# Patient Record
Sex: Female | Born: 1945
Health system: Southern US, Community
[De-identification: ages and names within clinical notes are randomized; demographics above are authoritative.]

## PROBLEM LIST (undated history)

## (undated) DIAGNOSIS — F419 Anxiety disorder, unspecified: Secondary | ICD-10-CM

## (undated) DIAGNOSIS — K449 Diaphragmatic hernia without obstruction or gangrene: Secondary | ICD-10-CM

## (undated) DIAGNOSIS — B019 Varicella without complication: Secondary | ICD-10-CM

## (undated) DIAGNOSIS — E785 Hyperlipidemia, unspecified: Secondary | ICD-10-CM

## (undated) DIAGNOSIS — IMO0002 Reserved for concepts with insufficient information to code with codable children: Secondary | ICD-10-CM

## (undated) DIAGNOSIS — K7290 Hepatic failure, unspecified without coma: Secondary | ICD-10-CM

## (undated) DIAGNOSIS — N3281 Overactive bladder: Secondary | ICD-10-CM

## (undated) DIAGNOSIS — M1990 Unspecified osteoarthritis, unspecified site: Secondary | ICD-10-CM

## (undated) DIAGNOSIS — K851 Biliary acute pancreatitis without necrosis or infection: Secondary | ICD-10-CM

## (undated) DIAGNOSIS — I10 Essential (primary) hypertension: Secondary | ICD-10-CM

## (undated) DIAGNOSIS — Z8739 Personal history of other diseases of the musculoskeletal system and connective tissue: Secondary | ICD-10-CM

## (undated) DIAGNOSIS — F329 Major depressive disorder, single episode, unspecified: Secondary | ICD-10-CM

## (undated) DIAGNOSIS — K279 Peptic ulcer, site unspecified, unspecified as acute or chronic, without hemorrhage or perforation: Secondary | ICD-10-CM

## (undated) DIAGNOSIS — Z8719 Personal history of other diseases of the digestive system: Secondary | ICD-10-CM

## (undated) DIAGNOSIS — K219 Gastro-esophageal reflux disease without esophagitis: Secondary | ICD-10-CM

## (undated) DIAGNOSIS — F32A Depression, unspecified: Secondary | ICD-10-CM

## (undated) DIAGNOSIS — Z9889 Other specified postprocedural states: Secondary | ICD-10-CM

## (undated) DIAGNOSIS — J302 Other seasonal allergic rhinitis: Secondary | ICD-10-CM

## (undated) HISTORY — DX: Gastro-esophageal reflux disease without esophagitis: K21.9

## (undated) HISTORY — DX: Other seasonal allergic rhinitis: J30.2

## (undated) HISTORY — PX: BUNIONECTOMY: SHX129

## (undated) HISTORY — DX: Depression, unspecified: F32.A

## (undated) HISTORY — DX: Hyperlipidemia, unspecified: E78.5

## (undated) HISTORY — DX: Unspecified osteoarthritis, unspecified site: M19.90

## (undated) HISTORY — PX: TONSILLECTOMY AND ADENOIDECTOMY: SUR1326

## (undated) HISTORY — PX: NASAL/SINUS ENDOSCOPY: SHX288

## (undated) HISTORY — DX: Major depressive disorder, single episode, unspecified: F32.9

## (undated) HISTORY — DX: Other specified postprocedural states: Z98.890

## (undated) HISTORY — DX: Biliary acute pancreatitis without necrosis or infection: K85.10

## (undated) HISTORY — DX: Personal history of other diseases of the digestive system: Z87.19

## (undated) HISTORY — PX: SKIN CANCER EXCISION: SHX779

## (undated) HISTORY — DX: Anxiety disorder, unspecified: F41.9

## (undated) HISTORY — DX: Diaphragmatic hernia without obstruction or gangrene: K44.9

## (undated) HISTORY — DX: Overactive bladder: N32.81

## (undated) HISTORY — DX: Personal history of other diseases of the musculoskeletal system and connective tissue: Z87.39

## (undated) HISTORY — DX: Essential (primary) hypertension: I10

## (undated) HISTORY — DX: Hepatic failure, unspecified without coma: K72.90

## (undated) HISTORY — DX: Peptic ulcer, site unspecified, unspecified as acute or chronic, without hemorrhage or perforation: K27.9

## (undated) HISTORY — DX: Varicella without complication: B01.9

## (undated) HISTORY — DX: Reserved for concepts with insufficient information to code with codable children: IMO0002

---
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
## 2001-07-23 ENCOUNTER — Ambulatory Visit (HOSPITAL_COMMUNITY): Admission: RE | Admit: 2001-07-23 | Discharge: 2001-07-23 | Payer: Self-pay | Admitting: Gastroenterology

## 2001-07-23 ENCOUNTER — Encounter: Payer: Self-pay | Admitting: Gastroenterology

## 2001-07-26 ENCOUNTER — Ambulatory Visit (HOSPITAL_COMMUNITY): Admission: RE | Admit: 2001-07-26 | Discharge: 2001-07-26 | Payer: Self-pay | Admitting: Gastroenterology

## 2001-07-26 ENCOUNTER — Encounter: Payer: Self-pay | Admitting: Gastroenterology

## 2012-07-24 DIAGNOSIS — L851 Acquired keratosis [keratoderma] palmaris et plantaris: Secondary | ICD-10-CM | POA: Diagnosis not present

## 2012-07-24 DIAGNOSIS — L819 Disorder of pigmentation, unspecified: Secondary | ICD-10-CM | POA: Diagnosis not present

## 2012-07-24 DIAGNOSIS — L570 Actinic keratosis: Secondary | ICD-10-CM | POA: Diagnosis not present

## 2012-07-24 DIAGNOSIS — L719 Rosacea, unspecified: Secondary | ICD-10-CM | POA: Diagnosis not present

## 2012-08-01 DIAGNOSIS — F411 Generalized anxiety disorder: Secondary | ICD-10-CM | POA: Diagnosis not present

## 2012-08-01 DIAGNOSIS — E785 Hyperlipidemia, unspecified: Secondary | ICD-10-CM | POA: Diagnosis not present

## 2012-08-01 DIAGNOSIS — I10 Essential (primary) hypertension: Secondary | ICD-10-CM | POA: Diagnosis not present

## 2012-08-01 DIAGNOSIS — F329 Major depressive disorder, single episode, unspecified: Secondary | ICD-10-CM | POA: Diagnosis not present

## 2012-08-28 DIAGNOSIS — L820 Inflamed seborrheic keratosis: Secondary | ICD-10-CM | POA: Diagnosis not present

## 2012-08-28 DIAGNOSIS — L738 Other specified follicular disorders: Secondary | ICD-10-CM | POA: Diagnosis not present

## 2012-08-28 DIAGNOSIS — L570 Actinic keratosis: Secondary | ICD-10-CM | POA: Diagnosis not present

## 2012-08-30 DIAGNOSIS — E785 Hyperlipidemia, unspecified: Secondary | ICD-10-CM | POA: Diagnosis not present

## 2012-10-21 DIAGNOSIS — M545 Low back pain: Secondary | ICD-10-CM | POA: Diagnosis not present

## 2012-10-21 DIAGNOSIS — M47812 Spondylosis without myelopathy or radiculopathy, cervical region: Secondary | ICD-10-CM | POA: Diagnosis not present

## 2012-10-21 DIAGNOSIS — M4310 Spondylolisthesis, site unspecified: Secondary | ICD-10-CM | POA: Diagnosis not present

## 2012-10-21 DIAGNOSIS — M5030 Other cervical disc degeneration, unspecified cervical region: Secondary | ICD-10-CM | POA: Diagnosis not present

## 2012-10-21 DIAGNOSIS — F329 Major depressive disorder, single episode, unspecified: Secondary | ICD-10-CM | POA: Diagnosis not present

## 2012-10-21 DIAGNOSIS — M542 Cervicalgia: Secondary | ICD-10-CM | POA: Diagnosis not present

## 2012-10-21 DIAGNOSIS — IMO0002 Reserved for concepts with insufficient information to code with codable children: Secondary | ICD-10-CM | POA: Diagnosis not present

## 2012-10-21 DIAGNOSIS — F411 Generalized anxiety disorder: Secondary | ICD-10-CM | POA: Diagnosis not present

## 2012-10-21 DIAGNOSIS — E785 Hyperlipidemia, unspecified: Secondary | ICD-10-CM | POA: Diagnosis not present

## 2012-11-05 DIAGNOSIS — M5020 Other cervical disc displacement, unspecified cervical region: Secondary | ICD-10-CM | POA: Diagnosis not present

## 2012-11-05 DIAGNOSIS — M47812 Spondylosis without myelopathy or radiculopathy, cervical region: Secondary | ICD-10-CM | POA: Diagnosis not present

## 2012-11-14 DIAGNOSIS — M25519 Pain in unspecified shoulder: Secondary | ICD-10-CM | POA: Diagnosis not present

## 2012-11-14 DIAGNOSIS — M6281 Muscle weakness (generalized): Secondary | ICD-10-CM | POA: Diagnosis not present

## 2012-11-14 DIAGNOSIS — M542 Cervicalgia: Secondary | ICD-10-CM | POA: Diagnosis not present

## 2012-11-14 DIAGNOSIS — M25619 Stiffness of unspecified shoulder, not elsewhere classified: Secondary | ICD-10-CM | POA: Diagnosis not present

## 2012-11-18 DIAGNOSIS — M542 Cervicalgia: Secondary | ICD-10-CM | POA: Diagnosis not present

## 2012-11-18 DIAGNOSIS — M5020 Other cervical disc displacement, unspecified cervical region: Secondary | ICD-10-CM | POA: Diagnosis not present

## 2012-11-19 DIAGNOSIS — M25619 Stiffness of unspecified shoulder, not elsewhere classified: Secondary | ICD-10-CM | POA: Diagnosis not present

## 2012-11-19 DIAGNOSIS — M25519 Pain in unspecified shoulder: Secondary | ICD-10-CM | POA: Diagnosis not present

## 2012-11-19 DIAGNOSIS — M542 Cervicalgia: Secondary | ICD-10-CM | POA: Diagnosis not present

## 2012-11-19 DIAGNOSIS — M6281 Muscle weakness (generalized): Secondary | ICD-10-CM | POA: Diagnosis not present

## 2012-11-21 DIAGNOSIS — M25519 Pain in unspecified shoulder: Secondary | ICD-10-CM | POA: Diagnosis not present

## 2012-11-21 DIAGNOSIS — M542 Cervicalgia: Secondary | ICD-10-CM | POA: Diagnosis not present

## 2012-11-21 DIAGNOSIS — M25619 Stiffness of unspecified shoulder, not elsewhere classified: Secondary | ICD-10-CM | POA: Diagnosis not present

## 2012-11-21 DIAGNOSIS — M6281 Muscle weakness (generalized): Secondary | ICD-10-CM | POA: Diagnosis not present

## 2012-11-26 DIAGNOSIS — M25619 Stiffness of unspecified shoulder, not elsewhere classified: Secondary | ICD-10-CM | POA: Diagnosis not present

## 2012-11-26 DIAGNOSIS — M6281 Muscle weakness (generalized): Secondary | ICD-10-CM | POA: Diagnosis not present

## 2012-11-26 DIAGNOSIS — M25519 Pain in unspecified shoulder: Secondary | ICD-10-CM | POA: Diagnosis not present

## 2012-11-26 DIAGNOSIS — M542 Cervicalgia: Secondary | ICD-10-CM | POA: Diagnosis not present

## 2012-11-28 DIAGNOSIS — M542 Cervicalgia: Secondary | ICD-10-CM | POA: Diagnosis not present

## 2012-11-28 DIAGNOSIS — M25619 Stiffness of unspecified shoulder, not elsewhere classified: Secondary | ICD-10-CM | POA: Diagnosis not present

## 2012-11-28 DIAGNOSIS — M25519 Pain in unspecified shoulder: Secondary | ICD-10-CM | POA: Diagnosis not present

## 2012-11-28 DIAGNOSIS — M6281 Muscle weakness (generalized): Secondary | ICD-10-CM | POA: Diagnosis not present

## 2012-12-03 DIAGNOSIS — M542 Cervicalgia: Secondary | ICD-10-CM | POA: Diagnosis not present

## 2012-12-03 DIAGNOSIS — M25519 Pain in unspecified shoulder: Secondary | ICD-10-CM | POA: Diagnosis not present

## 2012-12-03 DIAGNOSIS — M25619 Stiffness of unspecified shoulder, not elsewhere classified: Secondary | ICD-10-CM | POA: Diagnosis not present

## 2012-12-03 DIAGNOSIS — M6281 Muscle weakness (generalized): Secondary | ICD-10-CM | POA: Diagnosis not present

## 2012-12-06 DIAGNOSIS — M25519 Pain in unspecified shoulder: Secondary | ICD-10-CM | POA: Diagnosis not present

## 2012-12-06 DIAGNOSIS — M6281 Muscle weakness (generalized): Secondary | ICD-10-CM | POA: Diagnosis not present

## 2012-12-06 DIAGNOSIS — M542 Cervicalgia: Secondary | ICD-10-CM | POA: Diagnosis not present

## 2012-12-06 DIAGNOSIS — M25619 Stiffness of unspecified shoulder, not elsewhere classified: Secondary | ICD-10-CM | POA: Diagnosis not present

## 2012-12-10 DIAGNOSIS — M542 Cervicalgia: Secondary | ICD-10-CM | POA: Diagnosis not present

## 2012-12-10 DIAGNOSIS — M25519 Pain in unspecified shoulder: Secondary | ICD-10-CM | POA: Diagnosis not present

## 2012-12-10 DIAGNOSIS — M25619 Stiffness of unspecified shoulder, not elsewhere classified: Secondary | ICD-10-CM | POA: Diagnosis not present

## 2012-12-10 DIAGNOSIS — M6281 Muscle weakness (generalized): Secondary | ICD-10-CM | POA: Diagnosis not present

## 2012-12-12 DIAGNOSIS — M25619 Stiffness of unspecified shoulder, not elsewhere classified: Secondary | ICD-10-CM | POA: Diagnosis not present

## 2012-12-12 DIAGNOSIS — M25519 Pain in unspecified shoulder: Secondary | ICD-10-CM | POA: Diagnosis not present

## 2012-12-12 DIAGNOSIS — M6281 Muscle weakness (generalized): Secondary | ICD-10-CM | POA: Diagnosis not present

## 2012-12-12 DIAGNOSIS — M542 Cervicalgia: Secondary | ICD-10-CM | POA: Diagnosis not present

## 2013-04-21 DIAGNOSIS — I10 Essential (primary) hypertension: Secondary | ICD-10-CM | POA: Diagnosis not present

## 2013-04-21 DIAGNOSIS — E7800 Pure hypercholesterolemia, unspecified: Secondary | ICD-10-CM | POA: Diagnosis not present

## 2013-06-02 ENCOUNTER — Encounter: Payer: Self-pay | Admitting: Family Medicine

## 2013-06-02 ENCOUNTER — Ambulatory Visit (INDEPENDENT_AMBULATORY_CARE_PROVIDER_SITE_OTHER): Payer: Medicare Other | Admitting: Family Medicine

## 2013-06-02 VITALS — BP 130/86 | HR 88 | Temp 98.2°F | Resp 16 | Wt 158.0 lb

## 2013-06-02 DIAGNOSIS — K219 Gastro-esophageal reflux disease without esophagitis: Secondary | ICD-10-CM | POA: Insufficient documentation

## 2013-06-02 DIAGNOSIS — F419 Anxiety disorder, unspecified: Secondary | ICD-10-CM

## 2013-06-02 DIAGNOSIS — N318 Other neuromuscular dysfunction of bladder: Secondary | ICD-10-CM

## 2013-06-02 DIAGNOSIS — R29818 Other symptoms and signs involving the nervous system: Secondary | ICD-10-CM

## 2013-06-02 DIAGNOSIS — E785 Hyperlipidemia, unspecified: Secondary | ICD-10-CM

## 2013-06-02 DIAGNOSIS — F09 Unspecified mental disorder due to known physiological condition: Secondary | ICD-10-CM

## 2013-06-02 DIAGNOSIS — I10 Essential (primary) hypertension: Secondary | ICD-10-CM

## 2013-06-02 DIAGNOSIS — F32A Depression, unspecified: Secondary | ICD-10-CM

## 2013-06-02 DIAGNOSIS — F418 Other specified anxiety disorders: Secondary | ICD-10-CM

## 2013-06-02 DIAGNOSIS — R4189 Other symptoms and signs involving cognitive functions and awareness: Secondary | ICD-10-CM

## 2013-06-02 DIAGNOSIS — Z85828 Personal history of other malignant neoplasm of skin: Secondary | ICD-10-CM

## 2013-06-02 DIAGNOSIS — R2689 Other abnormalities of gait and mobility: Secondary | ICD-10-CM

## 2013-06-02 DIAGNOSIS — F329 Major depressive disorder, single episode, unspecified: Secondary | ICD-10-CM

## 2013-06-02 DIAGNOSIS — F341 Dysthymic disorder: Secondary | ICD-10-CM

## 2013-06-02 DIAGNOSIS — R413 Other amnesia: Secondary | ICD-10-CM | POA: Diagnosis not present

## 2013-06-02 DIAGNOSIS — N3281 Overactive bladder: Secondary | ICD-10-CM

## 2013-06-02 DIAGNOSIS — L570 Actinic keratosis: Secondary | ICD-10-CM

## 2013-06-02 HISTORY — DX: Overactive bladder: N32.81

## 2013-06-02 LAB — LIPID PANEL
CHOL/HDL RATIO: 5
Cholesterol: 235 mg/dL — ABNORMAL HIGH (ref 0–200)
HDL: 49.1 mg/dL (ref >39.00)
Triglycerides: 91 mg/dL (ref 0.0–149.0)
VLDL: 18.2 mg/dL (ref 0.0–40.0)

## 2013-06-02 LAB — BASIC METABOLIC PANEL
BUN: 19 mg/dL (ref 6–23)
CO2: 25 mEq/L (ref 19–32)
Calcium: 9.4 mg/dL (ref 8.4–10.5)
Chloride: 104 mEq/L (ref 96–112)
Creatinine, Ser: 0.8 mg/dL (ref 0.4–1.2)
GFR: 73.14 mL/min (ref >60.00)
GLUCOSE: 90 mg/dL (ref 70–99)
Potassium: 3.8 mEq/L (ref 3.5–5.1)
Sodium: 138 mEq/L (ref 135–145)

## 2013-06-02 LAB — LDL CHOLESTEROL, DIRECT: Direct LDL: 168.9 mg/dL

## 2013-06-02 LAB — MAGNESIUM: MAGNESIUM: 2 mg/dL (ref 1.5–2.5)

## 2013-06-02 NOTE — Patient Instructions (Addendum)
-  please call to set up mammogram  -please call to schedule appointment with psychiatrist - will let psychiatrist prescribe your ambien and psychiatric medications  -We placed a referral for you as discussed to the neurologist for you memory and balance and to the dermatologist. It usually takes about 1-2 weeks to process and schedule this referral. If you have not heard from [place] regarding this appointment in 2 weeks please contact our office.  -We have ordered labs or studies at this visit. It can take up to 1-2 weeks for results and processing. We will contact you with instructions IF your results are abnormal. Normal results will be released to your [facility]. If you have not heard from [place] or can not find your results in [facility] in 2 weeks please contact our office.  -PLEASE SIGN UP FOR MYCHART TODAY   We recommend the following healthy lifestyle measures: - eat a healthy diet consisting of lots of vegetables, fruits, beans, nuts, seeds, healthy meats such as white chicken and fish and whole grains.  - avoid fried foods, fast food, processed foods, sodas, red meet and other fattening foods.  - get a least 150 minutes of aerobic exercise per week.   Call pharmacy to transfer medications, please have your psychiatrist prescribe the ambien, buspar and prozac  Follow up in: 3-4 months

## 2013-06-02 NOTE — Progress Notes (Signed)
Chief Complaint  Patient presents with  . establish care  . dermatology referral    spot on hand, reoccuring    HPI:  ANTRICE PAL is here to establish care.  Last PCP and physical: reports has had routine gyn physicals in the past and all normal and she no longer gets these, mammogram in June 2013  Has the following chronic problems and concerns today:   AKs on hand: -hs [facility], want referral to derm  Anxiety and Depression: -on Buspar and Prozac, ambien 1-2 times per week -stable, but used to see psych and will establish care -husband is alcoholic  GERD: -stable on nexium -if tries to stop has symptoms  HLD: -on fenofibrate  Memory and balance issues: -not sure why taking on potassium -has trouble with multitasking and names, seems to be progressing -lately worsening and some balance issues  Hyperactive bladder: -stable on detrol  HTN:  -valsartan-hctz -stable    Patient Active Problem List   Diagnosis Date Noted  . Depression with anxiety 06/02/2013  . GERD (gastroesophageal reflux disease) 06/02/2013  . Hyperlipidemia 06/02/2013  . Overactive bladder 06/02/2013    Health Maintenance:  ROS: See pertinent positives and negatives per HPI.  Past Medical History  Diagnosis Date  . Anxiety   . Depression   . GERD (gastroesophageal reflux disease)   . Hyperlipidemia   . Overactive bladder 06/02/2013    Family History  Problem Relation Age of Onset  . Mental illness Mother   . Cancer Father     throat cancer  . Heart disease Father   . Mental illness Father   . Cancer Sister     thyroid    History   Social History  . Marital Status: Married    Spouse Name: N/A    Number of Children: N/A  . Years of Education: N/A   Social History Main Topics  . Smoking status: Never Smoker   . Smokeless tobacco: None  . Alcohol Use: Yes     Comment:  drink every 3-4 days  . Drug Use: None  . Sexual Activity: None   Other Topics Concern  . None    Social History Narrative   Work or School: retired Web designer Situation: lives with husband, daughter and 2 children      Spiritual Beliefs: Christian, Catholic      Lifestyle: no regular exercise; diet is good              Current outpatient prescriptions:busPIRone (BUSPAR) 10 MG tablet, Take 2 tablets by mouth twice daily, Disp: , Rfl: ;  Cholecalciferol (VITAMIN D3) 2000 UNITS TABS, Take 2,000 Units by mouth daily., Disp: , Rfl: ;  diphenhydramine-acetaminophen (TYLENOL PM EXTRA STRENGTH) 25-500 MG TABS, Take 1 tablet by mouth at bedtime as needed., Disp: , Rfl: ;  esomeprazole (NEXIUM) 20 MG capsule, Take 20 mg by mouth daily at 12 noon., Disp: , Rfl:  fenofibrate 160 MG tablet, Take 160 mg by mouth daily., Disp: , Rfl: ;  FLUoxetine (PROZAC) 40 MG capsule, Take 2 tablets by mouth once daily, Disp: , Rfl: ;  magnesium gluconate (MAGONATE) 500 MG tablet, Take 500 mg by mouth daily., Disp: , Rfl: ;  Potassium Gluconate 595 MG CAPS, Take 595 mg by mouth daily., Disp: , Rfl: ;  tolterodine (DETROL LA) 4 MG 24 hr capsule, Take 4 mg by mouth daily., Disp: , Rfl:  valsartan-hydrochlorothiazide (DIOVAN-HCT) 80-12.5 MG per tablet, Take 1 tablet by  mouth daily., Disp: , Rfl: ;  zolpidem (AMBIEN) 10 MG tablet, Take 10 mg by mouth at bedtime as needed for sleep., Disp: , Rfl:   EXAM:  Filed Vitals:   06/02/13 1053  BP: 130/86  Pulse: 88  Temp: 98.2 F (36.8 C)  Resp: 16    There is no height on file to calculate BMI.  GENERAL: vitals reviewed and listed above, alert, oriented, appears well hydrated and in no acute distress  HEENT: atraumatic, conjunttiva clear, no obvious abnormalities on inspection of external nose and ears  NECK: no obvious masses on inspection  LUNGS: clear to auscultation bilaterally, no wheezes, rales or rhonchi, good air movement  CV: HRRR, no peripheral edema  MS: moves all extremities without noticeable abnormality  PSYCH: pleasant and  cooperative, no obvious depression or anxiety  ASSESSMENT AND PLAN:  Discussed the following assessment and plan:  Cognitive decline - Plan: Ambulatory referral to Neurology  Poor balance - Plan: Ambulatory referral to Neurology  Anxiety and depression  Poor memory - Plan: Ambulatory referral to Neurology  Depression with anxiety  GERD (gastroesophageal reflux disease)  Hyperlipidemia - Plan: Basic metabolic panel, Lipid Panel, Magnesium  Overactive bladder  Essential hypertension, benign  Actinic keratosis - Plan: Ambulatory referral to Dermatology  Hx of nonmelanoma skin cancer - Plan: Ambulatory referral to Dermatology  -We reviewed the PMH, PSH, FH, SH, Meds and Allergies. -We provided refills for any medications we will prescribe as needed. -We addressed current concerns per orders and patient instructions. -We have asked for records for pertinent exams, studies, vaccines and notes from previous providers. -We have advised patient to follow up per instructions below. -advised she see psych for her psychiatirc medications and discussed dangers/adverse effects with ambien and advise to stop or decrease if her psychiatrist is ok with this -referrals placed per her request for dermatologist and neurologist -FASTING LABS -follow up in 3-4 months   -Patient advised to return or notify a doctor immediately if symptoms worsen or persist or new concerns arise.  Patient Instructions  -please call to set up mammogram  -please call to schedule appointment with psychiatrist - will let psychiatrist prescribe your ambien and psychiatric medications  -We placed a referral for you as discussed to the neurologist for you memory and balance and to the dermatologist. It usually takes about 1-2 weeks to process and schedule this referral. If you have not heard from [place] regarding this appointment in 2 weeks please contact our office.  -We have ordered labs or studies at this visit. It  can take up to 1-2 weeks for results and processing. We will contact you with instructions IF your results are abnormal. Normal results will be released to your [facility] If you have not heard from [place] or can not find your results in [facility] in 2 weeks please contact our office.  -PLEASE SIGN UP FOR MYCHART TODAY   We recommend the following healthy lifestyle measures: - eat a healthy diet consisting of lots of vegetables, fruits, beans, nuts, seeds, healthy meats such as white chicken and fish and whole grains.  - avoid fried foods, fast food, processed foods, sodas, red meet and other fattening foods.  - get a least 150 minutes of aerobic exercise per week.   Follow up in: 3-4 months       KIM, HANNAH R.

## 2013-06-04 ENCOUNTER — Ambulatory Visit (INDEPENDENT_AMBULATORY_CARE_PROVIDER_SITE_OTHER): Payer: Medicare Other | Admitting: Neurology

## 2013-06-04 ENCOUNTER — Encounter: Payer: Self-pay | Admitting: Neurology

## 2013-06-04 VITALS — BP 144/84 | HR 80 | Temp 98.6°F | Resp 14 | Ht 62.0 in | Wt 159.6 lb

## 2013-06-04 DIAGNOSIS — R413 Other amnesia: Secondary | ICD-10-CM

## 2013-06-04 DIAGNOSIS — F411 Generalized anxiety disorder: Secondary | ICD-10-CM | POA: Diagnosis not present

## 2013-06-04 DIAGNOSIS — R4189 Other symptoms and signs involving cognitive functions and awareness: Secondary | ICD-10-CM

## 2013-06-04 DIAGNOSIS — R29818 Other symptoms and signs involving the nervous system: Secondary | ICD-10-CM | POA: Diagnosis not present

## 2013-06-04 DIAGNOSIS — F419 Anxiety disorder, unspecified: Secondary | ICD-10-CM

## 2013-06-04 DIAGNOSIS — R2689 Other abnormalities of gait and mobility: Secondary | ICD-10-CM

## 2013-06-04 LAB — VITAMIN B12: VITAMIN B 12: 316 pg/mL (ref 211–911)

## 2013-06-04 LAB — TSH: TSH: 0.53 u[IU]/mL (ref 0.35–5.50)

## 2013-06-04 NOTE — Patient Instructions (Signed)
On my testing, I don't appreciate any mild cognitive impairment (evidence of early dementia).  I think the memory problems are likely related to stress.  Also, I don't appreciate any significant or noticeable gait or balance problems.  Again, I think that the stress you are under is contributing to preoccupation and difficulty with train of thought, as well as hypervigilance of symptoms.  I don't think we need to check imaging of the brain but we will check a B12 and thyroid level.  You may follow up in one year for re-evaluation if needed.  Call with questions or concerns.

## 2013-06-04 NOTE — Progress Notes (Signed)
NEUROLOGY CONSULTATION NOTE  Crystal Patrick MRN: [id number] DOB: 01-Oct-1945  Referring provider: Dr. Selena Batten Primary care provider: Dr. Selena Batten  Reason for consult:  Memory problems and balance.  HISTORY OF PRESENT ILLNESS: Crystal Patrick is a 68 year old right-handed woman with history of depression, anxiety, GERD, hyperlipidemia and overactive bladder who presents for cognitive decline and balance problems.  Records and images were personally reviewed where available.    She feels she has noted symptoms for some time, but has significantly progressed since this past summer.  She is under a great deal of stress and anxiety due to financial set backs.  She and her husband moved to [place] from [place] because they had to sell their house, which may be foreclosed.  Also, her husband is an alcoholic as well.  They moved in late-August and live with their daughter and her two twin girls.  Regarding memory, she notes word-finding difficulties.  She also has difficulty recalling names of acquaintances but not close friends or family.  She sometimes gets the names of her granddaughters mixed up.  She will misplace items.  She often has to reread book chapters because she will quickly forget.  She is able to follow a movie.  She finds it much more difficult to multi-task and if she is using the stove, she needs to use a timer so she does not forget to tend to whatever she is cooking.  She also finds it more difficult to perform simple tasks, such as setting the digital alarm clock.  She also had trouble using the candle lighter.  She does not get disoriented when she drives.  She is able to pay the pills without difficulty.  Family members or friends have not noticed any significant problems.  She denies hallucinations or delusions. She is also now much more irritable.  She has some difficulty tolerating her granddaughters.  Reports that her father had memory problems and her mother has dementia.  She  also notes difficulty with balance.  Sometimes she will stumble while walking.  She has never fell while walking.  She denies vision loss, dizziness, lightheadedness, leg weakness or numbness.  She denies neck and back pain.  In September, she fell backwards off a ladder and hit the back of her head.  She did not lose conscousness.  She did not have a headache.  Since she felt okay, she did not seek medical attention.  Several weeks later, she was on the ladder in the attic and almost felt herself fall again, but was able to catch herself.  06/02/13 LABS:  Na 138, K 3.8, Cl 104, glucose 90, BUN 19, Cr 0.8, Ca 9.4, Mg 2.0.  PAST MEDICAL HISTORY: Past Medical History  Diagnosis Date  . Anxiety   . Depression   . GERD (gastroesophageal reflux disease)   . Hyperlipidemia   . Overactive bladder 06/02/2013  . Arthritis   . Cancer     squamous  . Chicken pox   . Seasonal allergies   . Hypertension     high blood pressure    PAST SURGICAL HISTORY: Past Surgical History  Procedure Laterality Date  . Tonsillectomy and adenoidectomy    . Bunionectomy Bilateral   . Nasal/sinus endoscopy      MEDICATIONS: Current Outpatient Prescriptions on File Prior to Visit  Medication Sig Dispense Refill  . busPIRone (BUSPAR) 10 MG tablet Take 2 tablets by mouth twice daily      . Cholecalciferol (VITAMIN D3)  2000 UNITS TABS Take 2,000 Units by mouth daily.      . diphenhydramine-acetaminophen (TYLENOL PM EXTRA STRENGTH) 25-500 MG TABS Take 1 tablet by mouth at bedtime as needed.      Marland Kitchen esomeprazole (NEXIUM) 20 MG capsule Take 20 mg by mouth daily at 12 noon.      . fenofibrate 160 MG tablet Take 160 mg by mouth daily.      Marland Kitchen FLUoxetine (PROZAC) 40 MG capsule Take 2 tablets by mouth once daily      . magnesium gluconate (MAGONATE) 500 MG tablet Take 500 mg by mouth daily.      . Potassium Gluconate 595 MG CAPS Take 595 mg by mouth daily.      Marland Kitchen tolterodine (DETROL LA) 4 MG 24 hr capsule Take 4 mg by  mouth daily.      . valsartan-hydrochlorothiazide (DIOVAN-HCT) 80-12.5 MG per tablet Take 1 tablet by mouth daily.      Marland Kitchen zolpidem (AMBIEN) 10 MG tablet Take 10 mg by mouth at bedtime as needed for sleep.       No current facility-administered medications on file prior to visit.    ALLERGIES: Allergies  Allergen Reactions  . Statins     Liver failue    FAMILY HISTORY: Family History  Problem Relation Age of Onset  . Mental illness Mother   . Cancer Father     throat cancer  . Heart disease Father   . Mental illness Father   . Cancer Sister     thyroid    SOCIAL HISTORY: History   Social History  . Marital Status: Married    Spouse Name: N/A    Number of Children: N/A  . Years of Education: N/A   Occupational History  . Not on file.   Social History Main Topics  . Smoking status: Never Smoker   . Smokeless tobacco: Not on file  . Alcohol Use: Yes     Comment: one drink a day  . Drug Use: Not on file  . Sexual Activity: Not on file   Other Topics Concern  . 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, daughter and 2 children      Spiritual Beliefs: Christian, Catholic      Lifestyle: no regular exercise; diet is good              REVIEW OF SYSTEMS: Constitutional: No fevers, chills, or sweats, no generalized fatigue, change in appetite Eyes: No visual changes, double vision, eye pain Ear, nose and throat: No hearing loss, ear pain, nasal congestion, sore throat Cardiovascular: No chest pain, palpitations Respiratory:  No shortness of breath at rest or with exertion, wheezes GastrointestinaI: No nausea, vomiting, diarrhea, abdominal pain, fecal incontinence Genitourinary:  No dysuria, urinary retention or frequency Musculoskeletal:  No neck pain, back pain Integumentary: No rash, pruritus, skin lesions Neurological: as above Psychiatric: Depression, anxiety Endocrine: No  palpitations, fatigue, diaphoresis, mood swings, change in appetite, change in weight, increased thirst Hematologic/Lymphatic:  No anemia, purpura, petechiae. Allergic/Immunologic: no itchy/runny eyes, nasal congestion, recent allergic reactions, rashes  PHYSICAL EXAM: Filed Vitals:   06/04/13 1216  BP: 144/84  Pulse: 80  Temp: 98.6 F (37 C)  Resp: 14   General: No acute distress Head:  Normocephalic/atraumatic Neck: supple, no paraspinal tenderness, full range of motion Back: No paraspinal tenderness Heart: regular rate and rhythm Lungs: Clear to  auscultation bilaterally. Vascular: No carotid bruits. Neurological Exam: Mental status: alert and oriented to person, place, and time, speech fluent and not dysarthric, able to name, repeat, write, read and follow 3 step commands across midline.  Able to recall 3 out of 3 words after a couple of minutes (4 out of 5 words after 5 minutes).  Able to correctly complete Trail Making Test and draw a clock.  Had difficulty copying a cube but was eventually able to correctly perform this without help.  Did incorrectly copy intersecting pentagons slightly.  Attention was intact.  Naming fluency intact.  Abstraction intact.  MMSE 29/30.  MOCA 29/30 Cranial nerves: CN I: not tested CN II: pupils equal, round and reactive to light, visual fields intact, fundi unremarkable. CN III, IV, VI:  full range of motion, no nystagmus, no ptosis CN V: facial sensation intact CN VII: upper and lower face symmetric CN VIII: hearing intact CN IX, X: gag intact, uvula midline CN XI: sternocleidomastoid and trapezius muscles intact CN XII: tongue midline Bulk & Tone: normal, no fasciculations. Motor: 5/5 throughout Sensation: temperature and vibration intact. Deep Tendon Reflexes: 2+ throughout, toes down Finger to nose testing: no dysmetria Heel to shin: no dysmetria Gait: normal stance and stride.  Able to turn well, walk on heels, toes and in tandem.  Romberg negative with only brief mild sway.  IMPRESSION: 1.  Memory problems.  No discernible cognitive impairment found on my exam.  Suspect related to preoccupation due to stress and anxiety from outside factors. 2.  Balance problems.  No abnormalities found on exam.   PLAN: 1.  We will check B12 and TSH. 2.  No indication for need of brain imaging. 3.  Reassurance. 4.  May follow up for re-evaluation in one year if needed.  45 minutes spent with patient, over 50% spent counseling and coordinating care.  Thank you for allowing me to take part in the care of this patient.  Metta Clines, DO  CC:  Colin Benton, DO

## 2013-06-06 DIAGNOSIS — F331 Major depressive disorder, recurrent, moderate: Secondary | ICD-10-CM | POA: Diagnosis not present

## 2013-06-06 LAB — METHYLMALONIC ACID, SERUM: Methylmalonic Acid, Quant: 0.15 umol/L (ref <0.40)

## 2013-06-09 ENCOUNTER — Telehealth: Payer: Self-pay | Admitting: *Deleted

## 2013-06-09 NOTE — Telephone Encounter (Signed)
Message copied by Claudie Revering on Mon Jun 09, 2013  4:22 PM ------      Message from: JAFFE, ADAM R      Created: Mon Jun 09, 2013  8:32 AM       Please let Ms. Jablon know that her blood work looks okay.      ARJ      ----- Message -----         From: [facility]         Sent: 06/04/2013   4:42 PM           To: Dudley Major, DO                   ------

## 2013-06-12 ENCOUNTER — Encounter: Payer: Self-pay | Admitting: Family Medicine

## 2013-06-12 NOTE — Progress Notes (Signed)
Received office notes from [place]; Dr. [name], MD

## 2013-06-18 DIAGNOSIS — F331 Major depressive disorder, recurrent, moderate: Secondary | ICD-10-CM | POA: Diagnosis not present

## 2013-06-24 ENCOUNTER — Telehealth: Payer: Self-pay | Admitting: Family Medicine

## 2013-06-24 NOTE — Telephone Encounter (Signed)
Relevant patient education mailed to patient.  

## 2013-07-02 DIAGNOSIS — F331 Major depressive disorder, recurrent, moderate: Secondary | ICD-10-CM | POA: Diagnosis not present

## 2013-07-07 DIAGNOSIS — L570 Actinic keratosis: Secondary | ICD-10-CM | POA: Diagnosis not present

## 2013-07-07 DIAGNOSIS — D235 Other benign neoplasm of skin of trunk: Secondary | ICD-10-CM | POA: Diagnosis not present

## 2013-07-07 DIAGNOSIS — D485 Neoplasm of uncertain behavior of skin: Secondary | ICD-10-CM | POA: Diagnosis not present

## 2013-07-11 NOTE — Progress Notes (Signed)
Received office notes from [facility] on 2.16.15 for destruction of lesions with cryosurgery.  These are common, treat since irriated, can get more with time, and wound care.  Return in 1 month if symptoms worsen or persist and follow up yearly.  Sent to scan.

## 2013-07-16 DIAGNOSIS — F331 Major depressive disorder, recurrent, moderate: Secondary | ICD-10-CM | POA: Diagnosis not present

## 2013-07-29 DIAGNOSIS — F331 Major depressive disorder, recurrent, moderate: Secondary | ICD-10-CM | POA: Diagnosis not present

## 2013-08-12 DIAGNOSIS — F331 Major depressive disorder, recurrent, moderate: Secondary | ICD-10-CM | POA: Diagnosis not present

## 2013-08-26 DIAGNOSIS — F331 Major depressive disorder, recurrent, moderate: Secondary | ICD-10-CM | POA: Diagnosis not present

## 2013-09-09 DIAGNOSIS — F331 Major depressive disorder, recurrent, moderate: Secondary | ICD-10-CM | POA: Diagnosis not present

## 2013-09-15 ENCOUNTER — Encounter: Payer: Self-pay | Admitting: Family Medicine

## 2013-09-15 ENCOUNTER — Ambulatory Visit (INDEPENDENT_AMBULATORY_CARE_PROVIDER_SITE_OTHER): Payer: Medicare Other | Admitting: Family Medicine

## 2013-09-15 VITALS — BP 132/88 | HR 86 | Temp 98.9°F | Ht 62.0 in | Wt 157.8 lb

## 2013-09-15 DIAGNOSIS — F341 Dysthymic disorder: Secondary | ICD-10-CM | POA: Diagnosis not present

## 2013-09-15 DIAGNOSIS — I10 Essential (primary) hypertension: Secondary | ICD-10-CM

## 2013-09-15 DIAGNOSIS — N318 Other neuromuscular dysfunction of bladder: Secondary | ICD-10-CM | POA: Diagnosis not present

## 2013-09-15 DIAGNOSIS — E785 Hyperlipidemia, unspecified: Secondary | ICD-10-CM | POA: Diagnosis not present

## 2013-09-15 DIAGNOSIS — K219 Gastro-esophageal reflux disease without esophagitis: Secondary | ICD-10-CM

## 2013-09-15 DIAGNOSIS — N3281 Overactive bladder: Secondary | ICD-10-CM

## 2013-09-15 DIAGNOSIS — F418 Other specified anxiety disorders: Secondary | ICD-10-CM

## 2013-09-15 MED ORDER — TOLTERODINE TARTRATE ER 4 MG PO CP24: 4.0000 mg | ORAL_CAPSULE | Freq: Every day | ORAL | Status: DC

## 2013-09-15 MED ORDER — FENOFIBRATE 160 MG PO TABS: 160.0000 mg | ORAL_TABLET | Freq: Every day | ORAL | Status: DC

## 2013-09-15 MED ORDER — VALSARTAN-HYDROCHLOROTHIAZIDE 80-12.5 MG PO TABS: 1.0000 | ORAL_TABLET | Freq: Every day | ORAL | Status: DC

## 2013-09-15 NOTE — Progress Notes (Signed)
No chief complaint on file.   HPI:  Follow up:  Depression/Co impariment: -per review neuro notes, cog issues likely related to depression -psych adjusting medications and decreasing prozac -reports things are going well despite situation not great still at home  GERD: -stable  HLD: -pn fenofibrate, reports can not take statins - systemic reaction and severe liver reaction -diet has been poor now  -reports is going to start walking and watching diet -does not want to check now - prefers to work on diet and exercise and check at physical  Hyperactive bladder -stable  ROS: See pertinent positives and negatives per HPI.  Past Medical History  Diagnosis Date  . Anxiety   . Depression   . GERD (gastroesophageal reflux disease)   . Hyperlipidemia   . Overactive bladder 06/02/2013  . Arthritis   . Cancer     squamous  . Chicken pox   . Seasonal allergies   . Hypertension     high blood pressure    Past Surgical History  Procedure Laterality Date  . Tonsillectomy and adenoidectomy    . Bunionectomy Bilateral   . Nasal/sinus endoscopy      Family History  Problem Relation Age of Onset  . Mental illness Mother   . Cancer Father     throat cancer  . Heart disease Father   . Mental illness Father   . Cancer Sister     thyroid    History   Social History  . Marital Status: Married    Spouse Name: N/A    Number of Children: N/A  . Years of Education: N/A   Social History Main Topics  . Smoking status: Never Smoker   . Smokeless tobacco: None  . Alcohol Use: Yes     Comment: one drink a day  . Drug Use: None  . Sexual Activity: None   Other Topics Concern  . 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      Spiritual Beliefs: Christian, Catholic      Lifestyle: no regular exercise; diet is good              Current outpatient prescriptions:busPIRone  (BUSPAR) 10 MG tablet, Take 2 tablets by mouth twice daily, Disp: , Rfl: ;  Cholecalciferol (VITAMIN D3) 2000 UNITS TABS, Take 2,000 Units by mouth daily., Disp: , Rfl: ;  diphenhydramine-acetaminophen (TYLENOL PM EXTRA STRENGTH) 25-500 MG TABS, Take 1 tablet by mouth at bedtime as needed., Disp: , Rfl: ;  esomeprazole (NEXIUM) 20 MG capsule, Take 20 mg by mouth daily at 12 noon., Disp: , Rfl:  fenofibrate 160 MG tablet, Take 1 tablet (160 mg total) by mouth daily., Disp: 90 tablet, Rfl: 3;  FLUoxetine (PROZAC) 40 MG capsule, Take 2 tablets by mouth once daily, Disp: , Rfl: ;  magnesium gluconate (MAGONATE) 500 MG tablet, Take 500 mg by mouth daily., Disp: , Rfl: ;  Potassium Gluconate 595 MG CAPS, Take 595 mg by mouth daily., Disp: , Rfl:  tolterodine (DETROL LA) 4 MG 24 hr capsule, Take 1 capsule (4 mg total) by mouth daily., Disp: 90 capsule, Rfl: 3;  valsartan-hydrochlorothiazide (DIOVAN-HCT) 80-12.5 MG per tablet, Take 1 tablet by mouth daily., Disp: 90 tablet, Rfl: 3;  zolpidem (AMBIEN) 10 MG tablet, Take 10 mg by mouth at bedtime as needed for sleep., Disp: , Rfl:   EXAM:  Filed Vitals:  09/15/13 0950  BP: 132/88  Pulse: 86  Temp: 98.9 F (37.2 C)    Body mass index is 28.85 kg/(m^2).  GENERAL: vitals reviewed and listed above, alert, oriented, appears well hydrated and in no acute distress  HEENT: atraumatic, conjunttiva clear, no obvious abnormalities on inspection of external nose and ears  NECK: no obvious masses on inspection  LUNGS: clear to auscultation bilaterally, no wheezes, rales or rhonchi, good air movement  CV: HRRR, no peripheral edema  MS: moves all extremities without noticeable abnormality  PSYCH: pleasant and cooperative, no obvious depression or anxiety  ASSESSMENT AND PLAN:  Discussed the following assessment and plan:  Depression with anxiety -seeing psych, doing better  GERD (gastroesophageal reflux disease) -stable  Hyperlipidemia - Plan:  fenofibrate 160 MG tablet, CANCELED: Lipid Panel -wants to wait on recheck until has worked on diet and exercise more, recheck at physical  Overactive bladder - Plan: tolterodine (DETROL LA) 4 MG 24 hr capsule  Hypertension - Plan: valsartan-hydrochlorothiazide (DIOVAN-HCT) 80-12.5 MG per tablet -better on recheck, monitor  Follow up for physical, she reports may not be until the end of the summer  -Patient advised to return or notify a doctor immediately if symptoms worsen or persist or new concerns arise.  Patient Instructions  -We have ordered labs or studies at this visit. It can take up to 1-2 weeks for results and processing. We will contact you with instructions IF your results are abnormal. Normal results will be released to your [facility]. If you have not heard from [place] or can not find your results in [facility] in 2 weeks please contact our office.  -PLEASE SIGN UP FOR MYCHART TODAY   We recommend the following healthy lifestyle measures: - eat a healthy diet consisting of lots of vegetables, fruits, beans, nuts, seeds, healthy meats such as white chicken and fish and whole grains.  - avoid fried foods, fast food, processed foods, sodas, red meet and other fattening foods.  - get a least 150 minutes of aerobic exercise per week.   Follow up in: 3 months for physical exam      Crystal Patrick

## 2013-09-15 NOTE — Progress Notes (Signed)
Pre visit review using our clinic review tool, if applicable. No additional management support is needed unless otherwise documented below in the visit note. 

## 2013-09-15 NOTE — Patient Instructions (Signed)
-  We have ordered labs or studies at this visit. It can take up to 1-2 weeks for results and processing. We will contact you with instructions IF your results are abnormal. Normal results will be released to your [facility]. If you have not heard from [place] or can not find your results in [facility] in 2 weeks please contact our office.  -PLEASE SIGN UP FOR MYCHART TODAY   We recommend the following healthy lifestyle measures: - eat a healthy diet consisting of lots of vegetables, fruits, beans, nuts, seeds, healthy meats such as white chicken and fish and whole grains.  - avoid fried foods, fast food, processed foods, sodas, red meet and other fattening foods.  - get a least 150 minutes of aerobic exercise per week.   Follow up in: 3 months for physical exam

## 2013-09-18 ENCOUNTER — Ambulatory Visit (INDEPENDENT_AMBULATORY_CARE_PROVIDER_SITE_OTHER): Payer: Medicare Other | Admitting: Family Medicine

## 2013-09-18 ENCOUNTER — Encounter: Payer: Self-pay | Admitting: Family Medicine

## 2013-09-18 VITALS — BP 140/92 | HR 82 | Temp 99.0°F | Ht 62.0 in | Wt 150.8 lb

## 2013-09-18 DIAGNOSIS — R310 Gross hematuria: Secondary | ICD-10-CM

## 2013-09-18 DIAGNOSIS — R300 Dysuria: Secondary | ICD-10-CM

## 2013-09-18 LAB — POCT URINALYSIS DIPSTICK
Bilirubin, UA: NEGATIVE
Glucose, UA: NEGATIVE
Ketones, UA: NEGATIVE
Leukocytes, UA: NEGATIVE
NITRITE UA: NEGATIVE
Protein, UA: NEGATIVE
Spec Grav, UA: 1.01
Urobilinogen, UA: 0.2
pH, UA: 6.5

## 2013-09-18 LAB — URINALYSIS, MICROSCOPIC ONLY: RBC / HPF: NONE SEEN (ref 0–?)

## 2013-09-18 NOTE — Patient Instructions (Signed)
-  we will check another test to see how much blood is in the urine and a culture  -if infection will call with and antibiotic  -otherwise if abnormal blood in urine will recheck in 1 month or have you see a urologist if no infection

## 2013-09-18 NOTE — Progress Notes (Signed)
No chief complaint on file.   HPI:  Acute visit for Dysuria: -started about 2 weeks ago -on and off -burning with urination, intermittent gross hematuria, ? Subjective fever - felt hot -denies: flank pain, objective fever, nausea, vomiting, abd pain, pelvic pain or back pain -on detrol for overactive bladder -fh bladder ca -hx uti remotely  ROS: See pertinent positives and negatives per HPI.  Past Medical History  Diagnosis Date  . Anxiety   . Depression   . GERD (gastroesophageal reflux disease)   . Hyperlipidemia   . Overactive bladder 06/02/2013  . Arthritis   . Cancer     squamous  . Chicken pox   . Seasonal allergies   . Hypertension     high blood pressure    Past Surgical History  Procedure Laterality Date  . Tonsillectomy and adenoidectomy    . Bunionectomy Bilateral   . Nasal/sinus endoscopy      Family History  Problem Relation Age of Onset  . Mental illness Mother   . Cancer Father     throat cancer  . Heart disease Father   . Mental illness Father   . Cancer Sister     thyroid    History   Social History  . Marital Status: Married    Spouse Name: N/A    Number of Children: N/A  . Years of Education: N/A   Social History Main Topics  . Smoking status: Never Smoker   . Smokeless tobacco: None  . Alcohol Use: Yes     Comment: one drink a day  . Drug Use: None  . Sexual Activity: None   Other Topics Concern  . 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      Spiritual Beliefs: Christian, Catholic      Lifestyle: no regular exercise; diet is good              Current outpatient prescriptions:busPIRone (BUSPAR) 10 MG tablet, Take 2 tablets by mouth twice daily, Disp: , Rfl: ;  Cholecalciferol (VITAMIN D3) 2000 UNITS TABS, Take 2,000 Units by mouth daily., Disp: , Rfl: ;  esomeprazole (NEXIUM) 20 MG capsule, Take 20 mg by mouth daily at 12  noon., Disp: , Rfl: ;  fenofibrate 160 MG tablet, Take 1 tablet (160 mg total) by mouth daily., Disp: 90 tablet, Rfl: 3 FLUoxetine (PROZAC) 40 MG capsule, Take 2 tablets by mouth once daily, Disp: , Rfl: ;  magnesium gluconate (MAGONATE) 500 MG tablet, Take 500 mg by mouth daily., Disp: , Rfl: ;  Potassium Gluconate 595 MG CAPS, Take 595 mg by mouth daily., Disp: , Rfl: ;  tolterodine (DETROL LA) 4 MG 24 hr capsule, Take 1 capsule (4 mg total) by mouth daily., Disp: 90 capsule, Rfl: 3 valsartan-hydrochlorothiazide (DIOVAN-HCT) 80-12.5 MG per tablet, Take 1 tablet by mouth daily., Disp: 90 tablet, Rfl: 3;  zolpidem (AMBIEN) 10 MG tablet, Take 10 mg by mouth at bedtime as needed for sleep., Disp: , Rfl: ;  diphenhydramine-acetaminophen (TYLENOL PM EXTRA STRENGTH) 25-500 MG TABS, Take 1 tablet by mouth at bedtime as needed., Disp: , Rfl:   EXAM:  Filed Vitals:   09/18/13 0844  BP: 140/92  Pulse: 82  Temp: 99 F (37.2 C)    Body mass index is 27.57 kg/(m^2).  GENERAL: vitals reviewed and listed above, alert, oriented, appears well hydrated and in no acute  distress  HEENT: atraumatic, conjunttiva clear, no obvious abnormalities on inspection of external nose and ears  NECK: no obvious masses on inspection  LUNGS: clear to auscultation bilaterally, no wheezes, rales or rhonchi, good air movement  CV: HRRR, no peripheral edema  ABD: BS+, soft, NTTP, no CVA TTP  MS: moves all extremities without noticeable abnormality  PSYCH: pleasant and cooperative, no obvious depression or anxiety  ASSESSMENT AND PLAN:  Discussed the following assessment and plan:  Dysuria - Plan: POC Urinalysis Dipstick, Urine Microscopic Only, Urine culture  Hematuria, gross  -we discussed possible serious and likely etiologies, workup and treatment, treatment risks and return precautions -after this discussion, Artice opted for plan per instructions and orders -follow up advised as needed and per recs pending  labs -of course, we advised Aissatou  to return or notify a doctor immediately if symptoms worsen or persist or new concerns arise.  -Patient advised to return or notify a doctor immediately if symptoms worsen or persist or new concerns arise.  Patient Instructions  -we will check another test to see how much blood is in the urine and a culture  -if infection will call with and antibiotic  -otherwise if abnormal blood in urine will recheck in 1 month or have you see a urologist if no infection     [name]

## 2013-09-18 NOTE — Progress Notes (Signed)
Pre visit review using our clinic review tool, if applicable. No additional management support is needed unless otherwise documented below in the visit note. 

## 2013-09-20 LAB — URINE CULTURE
COLONY COUNT: NO GROWTH
Organism ID, Bacteria: NO GROWTH

## 2013-09-22 ENCOUNTER — Other Ambulatory Visit: Payer: Self-pay

## 2013-09-22 ENCOUNTER — Other Ambulatory Visit: Payer: Self-pay | Admitting: Family Medicine

## 2013-09-22 ENCOUNTER — Other Ambulatory Visit (INDEPENDENT_AMBULATORY_CARE_PROVIDER_SITE_OTHER): Payer: Medicare Other

## 2013-09-22 DIAGNOSIS — R300 Dysuria: Secondary | ICD-10-CM

## 2013-09-22 LAB — POCT URINALYSIS DIPSTICK
BILIRUBIN UA: NEGATIVE
GLUCOSE UA: NEGATIVE
Ketones, UA: NEGATIVE
NITRITE UA: NEGATIVE
UROBILINOGEN UA: 0.2
pH, UA: 7

## 2013-09-22 MED ORDER — NITROFURANTOIN MONOHYD MACRO 100 MG PO CAPS: 100.0000 mg | ORAL_CAPSULE | Freq: Two times a day (BID) | ORAL | Status: DC

## 2013-09-23 LAB — URINALYSIS, ROUTINE W REFLEX MICROSCOPIC
BILIRUBIN URINE: NEGATIVE
KETONES UR: NEGATIVE
Nitrite: NEGATIVE
PH: 7 (ref 5.0–8.0)
Specific Gravity, Urine: 1.02 (ref 1.000–1.030)
Total Protein, Urine: NEGATIVE
Urine Glucose: NEGATIVE
Urobilinogen, UA: 0.2 (ref 0.0–1.0)

## 2013-10-02 ENCOUNTER — Ambulatory Visit (INDEPENDENT_AMBULATORY_CARE_PROVIDER_SITE_OTHER): Payer: Medicare Other | Admitting: Family Medicine

## 2013-10-02 ENCOUNTER — Encounter: Payer: Self-pay | Admitting: Family Medicine

## 2013-10-02 VITALS — BP 128/80 | HR 88 | Temp 97.5°F | Ht 62.0 in | Wt 157.5 lb

## 2013-10-02 DIAGNOSIS — H6980 Other specified disorders of Eustachian tube, unspecified ear: Secondary | ICD-10-CM

## 2013-10-02 DIAGNOSIS — J309 Allergic rhinitis, unspecified: Secondary | ICD-10-CM

## 2013-10-02 DIAGNOSIS — B3731 Acute candidiasis of vulva and vagina: Secondary | ICD-10-CM

## 2013-10-02 DIAGNOSIS — B373 Candidiasis of vulva and vagina: Secondary | ICD-10-CM | POA: Diagnosis not present

## 2013-10-02 DIAGNOSIS — H6990 Unspecified Eustachian tube disorder, unspecified ear: Secondary | ICD-10-CM

## 2013-10-02 MED ORDER — FLUCONAZOLE 150 MG PO TABS: 150.0000 mg | ORAL_TABLET | Freq: Once | ORAL | Status: DC

## 2013-10-02 NOTE — Progress Notes (Signed)
Pre visit review using our clinic review tool, if applicable. No additional management support is needed unless otherwise documented below in the visit note. 

## 2013-10-02 NOTE — Patient Instructions (Signed)
-  nasocort daily for 21 days  -afrin for 4 days then stop  -diflucan for the yeast today then in a week again if needed

## 2013-10-02 NOTE — Progress Notes (Signed)
No chief complaint on file.   HPI:  Acute visit for ear issues: -allergies, ears popping, L ear hurting on and off, PND, sneezing, nasal congestion -denies: fevers, chills, vomiting, sinus pain, malaise, hearing loss -no taking anythin for allergies  Yeast inf: -took abx for uti and now with vulvovag pruritis, white discharge -denies urinary symptoms, pain, fevers, vomiting  ROS: See pertinent positives and negatives per HPI.  Past Medical History  Diagnosis Date  . Anxiety   . Depression   . GERD (gastroesophageal reflux disease)   . Hyperlipidemia   . Overactive bladder 06/02/2013  . Arthritis   . Cancer     squamous  . Chicken pox   . Seasonal allergies   . Hypertension     high blood pressure    Past Surgical History  Procedure Laterality Date  . Tonsillectomy and adenoidectomy    . Bunionectomy Bilateral   . Nasal/sinus endoscopy      Family History  Problem Relation Age of Onset  . Mental illness Mother   . Cancer Father     throat cancer  . Heart disease Father   . Mental illness Father   . Cancer Sister     thyroid    History   Social History  . Marital Status: Married    Spouse Name: N/A    Number of Children: N/A  . Years of Education: N/A   Social History Main Topics  . Smoking status: Never Smoker   . Smokeless tobacco: None  . Alcohol Use: Yes     Comment: one drink a day  . Drug Use: None  . Sexual Activity: None   Other Topics Concern  . 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      Spiritual Beliefs: Christian, Catholic      Lifestyle: no regular exercise; diet is good              Current outpatient prescriptions:busPIRone (BUSPAR) 10 MG tablet, Take 2 tablets by mouth twice daily, Disp: , Rfl: ;  Cholecalciferol (VITAMIN D3) 2000 UNITS TABS, Take 2,000 Units by mouth daily., Disp: , Rfl: ;  diphenhydramine-acetaminophen  (TYLENOL PM EXTRA STRENGTH) 25-500 MG TABS, Take 1 tablet by mouth at bedtime as needed., Disp: , Rfl: ;  esomeprazole (NEXIUM) 20 MG capsule, Take 20 mg by mouth daily at 12 noon., Disp: , Rfl:  fenofibrate 160 MG tablet, Take 1 tablet (160 mg total) by mouth daily., Disp: 90 tablet, Rfl: 3;  FLUoxetine (PROZAC) 40 MG capsule, Take 2 tablets by mouth once daily, Disp: , Rfl: ;  magnesium gluconate (MAGONATE) 500 MG tablet, Take 500 mg by mouth daily., Disp: , Rfl: ;  nitrofurantoin, macrocrystal-monohydrate, (MACROBID) 100 MG capsule, Take 1 capsule (100 mg total) by mouth 2 (two) times daily., Disp: 14 capsule, Rfl: 0 Potassium Gluconate 595 MG CAPS, Take 595 mg by mouth daily., Disp: , Rfl: ;  tolterodine (DETROL LA) 4 MG 24 hr capsule, Take 1 capsule (4 mg total) by mouth daily., Disp: 90 capsule, Rfl: 3;  valsartan-hydrochlorothiazide (DIOVAN-HCT) 80-12.5 MG per tablet, Take 1 tablet by mouth daily., Disp: 90 tablet, Rfl: 3;  zolpidem (AMBIEN) 10 MG tablet, Take 10 mg by mouth at bedtime as needed for sleep., Disp: , Rfl:  fluconazole (DIFLUCAN) 150 MG tablet, Take 1 tablet (150 mg total) by mouth once., Disp: 1 tablet, Rfl: 1  EXAM:  Filed Vitals:   10/02/13 1342  BP: 128/80  Pulse: 88  Temp: 97.5 F (36.4 C)    Body mass index is 28.8 kg/(m^2).  GENERAL: vitals reviewed and listed above, alert, oriented, appears well hydrated and in no acute distress  HEENT: atraumatic, conjunttiva clear, no obvious abnormalities on inspection of external nose and ears, normal appearance of ear canals and TMs, clear nasal congestion, mild post oropharyngeal erythema with PND, no tonsillar edema or exudate, no sinus TTP  NECK: no obvious masses on inspection  LUNGS: clear to auscultation bilaterally, no wheezes, rales or rhonchi, good air movement  CV: HRRR, no peripheral edema  MS: moves all extremities without noticeable abnormality  PSYCH: pleasant and cooperative, no obvious depression or  anxiety  ASSESSMENT AND PLAN:  Discussed the following assessment and plan:  Yeast vaginitis - Plan: fluconazole (DIFLUCAN) 150 MG tablet  Eustachian tube dysfunction  Allergic rhinitis  -Patient advised to return or notify a doctor immediately if symptoms worsen or persist or new concerns arise.  Patient Instructions  -nasocort daily for 21 days  -afrin for 4 days then stop  -diflucan for the yeast today then in a week again if needed     Crystal Patrick

## 2013-10-16 DIAGNOSIS — F331 Major depressive disorder, recurrent, moderate: Secondary | ICD-10-CM | POA: Diagnosis not present

## 2013-10-20 ENCOUNTER — Encounter: Payer: Self-pay | Admitting: Family Medicine

## 2013-10-20 ENCOUNTER — Ambulatory Visit (INDEPENDENT_AMBULATORY_CARE_PROVIDER_SITE_OTHER): Payer: Medicare Other | Admitting: Family Medicine

## 2013-10-20 VITALS — BP 132/82 | HR 87 | Temp 98.0°F | Ht 62.0 in | Wt 156.5 lb

## 2013-10-20 DIAGNOSIS — R3129 Other microscopic hematuria: Secondary | ICD-10-CM | POA: Diagnosis not present

## 2013-10-20 DIAGNOSIS — J309 Allergic rhinitis, unspecified: Secondary | ICD-10-CM | POA: Diagnosis not present

## 2013-10-20 DIAGNOSIS — H6980 Other specified disorders of Eustachian tube, unspecified ear: Secondary | ICD-10-CM

## 2013-10-20 LAB — POCT URINALYSIS DIPSTICK
BILIRUBIN UA: NEGATIVE
Glucose, UA: NEGATIVE
KETONES UA: NEGATIVE
Nitrite, UA: NEGATIVE
PROTEIN UA: NEGATIVE
SPEC GRAV UA: 1.01
Urobilinogen, UA: 0.2
pH, UA: 7

## 2013-10-20 NOTE — Progress Notes (Signed)
Pre visit review using our clinic review tool, if applicable. No additional management support is needed unless otherwise documented below in the visit note. 

## 2013-10-20 NOTE — Patient Instructions (Signed)
-  start zyrtec - keep take nasacort - see ear doctor if ear issues persist  -We have ordered labs or studies at this visit. It can take up to 1-2 weeks for results and processing. We will contact you with instructions IF your results are abnormal. Normal results will be released to your [facility]. If you have not heard from [place] or can not find your results in [facility] in 2 weeks please contact our office.  -follow up 3-4 months and as needed

## 2013-10-20 NOTE — Progress Notes (Signed)
No chief complaint on file.   HPI:  Follow up urine: -UTI one month ago with microscopic hematuria, treated and no symptoms -checking urine to ensure hematuria resolved and she wants to see urologist if not -denies gross hematuria, urgency, dysuria, fevers, malaise  Popping in ears: -chronic, nasal congestion, sneezing -doing nasocort daily -denies fevers, pain, tooth pain, SOB  ROS: See pertinent positives and negatives per HPI.  Past Medical History  Diagnosis Date  . Anxiety   . Depression   . GERD (gastroesophageal reflux disease)   . Hyperlipidemia   . Overactive bladder 06/02/2013  . Arthritis   . Cancer     squamous  . Chicken pox   . Seasonal allergies   . Hypertension     high blood pressure    Past Surgical History  Procedure Laterality Date  . Tonsillectomy and adenoidectomy    . Bunionectomy Bilateral   . Nasal/sinus endoscopy      Family History  Problem Relation Age of Onset  . Mental illness Mother   . Cancer Father     throat cancer  . Heart disease Father   . Mental illness Father   . Cancer Sister     thyroid    History   Social History  . Marital Status: Married    Spouse Name: N/A    Number of Children: N/A  . Years of Education: N/A   Social History Main Topics  . Smoking status: Never Smoker   . Smokeless tobacco: None  . Alcohol Use: Yes     Comment: one drink a day  . Drug Use: None  . Sexual Activity: None   Other Topics Concern  . 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      Spiritual Beliefs: Christian, Catholic      Lifestyle: no regular exercise; diet is good              Current outpatient prescriptions:busPIRone (BUSPAR) 10 MG tablet, Take 2 tablets by mouth twice daily, Disp: , Rfl: ;  Cholecalciferol (VITAMIN D3) 2000 UNITS TABS, Take 2,000 Units by mouth daily., Disp: , Rfl: ;  diphenhydramine-acetaminophen  (TYLENOL PM EXTRA STRENGTH) 25-500 MG TABS, Take 1 tablet by mouth at bedtime as needed., Disp: , Rfl: ;  esomeprazole (NEXIUM) 20 MG capsule, Take 20 mg by mouth daily at 12 noon., Disp: , Rfl:  fenofibrate 160 MG tablet, Take 1 tablet (160 mg total) by mouth daily., Disp: 90 tablet, Rfl: 3;  fluconazole (DIFLUCAN) 150 MG tablet, Take 1 tablet (150 mg total) by mouth once., Disp: 1 tablet, Rfl: 1;  FLUoxetine (PROZAC) 40 MG capsule, Take 2 tablets by mouth once daily, Disp: , Rfl: ;  magnesium gluconate (MAGONATE) 500 MG tablet, Take 500 mg by mouth daily., Disp: , Rfl:  nitrofurantoin, macrocrystal-monohydrate, (MACROBID) 100 MG capsule, Take 1 capsule (100 mg total) by mouth 2 (two) times daily., Disp: 14 capsule, Rfl: 0;  Potassium Gluconate 595 MG CAPS, Take 595 mg by mouth daily., Disp: , Rfl: ;  tolterodine (DETROL LA) 4 MG 24 hr capsule, Take 1 capsule (4 mg total) by mouth daily., Disp: 90 capsule, Rfl: 3 valsartan-hydrochlorothiazide (DIOVAN-HCT) 80-12.5 MG per tablet, Take 1 tablet by mouth daily., Disp: 90 tablet, Rfl: 3;  zolpidem (AMBIEN) 10 MG tablet, Take 10 mg by mouth at bedtime as needed for sleep., Disp: , Rfl:  EXAM:  Filed Vitals:   10/20/13 0846  BP: 132/82  Pulse: 87  Temp: 98 F (36.7 C)    Body mass index is 28.62 kg/(m^2).  GENERAL: vitals reviewed and listed above, alert, oriented, appears well hydrated and in no acute distress  HEENT: atraumatic, conjunttiva clear, no obvious abnormalities on inspection of external nose and ears, normal appearance of ear canals and TMs except clear effusion L, clear nasal congestion, mild post oropharyngeal erythema with PND, no tonsillar edema or exudate, no sinus TTP  NECK: no obvious masses on inspection  LUNGS: clear to auscultation bilaterally, no wheezes, rales or rhonchi, good air movement  CV: HRRR, no peripheral edema  MS: moves all extremities without noticeable abnormality  PSYCH: pleasant and cooperative, no  obvious depression or anxiety  ASSESSMENT AND PLAN:  Discussed the following assessment and plan:  Microscopic hematuria -check urine, urology if any microscopic hematuria persists or recurrent UTI  Eustachian tube dysfunction -likely related to allergies  Allergic rhinitis -discussed options; opted for adding antihistamine then ENT if persists, she does not wish to see allergiest  -Patient advised to return or notify a doctor immediately if symptoms worsen or persist or new concerns arise.  Patient Instructions  -start zyrtec - keep take nasacort - see ear doctor if ear issues persist  -We have ordered labs or studies at this visit. It can take up to 1-2 weeks for results and processing. We will contact you with instructions IF your results are abnormal. Normal results will be released to your [facility]. If you have not heard from [place] or can not find your results in [facility] in 2 weeks please contact our office.  -follow up 3-4 months and as needed              Crystal Patrick

## 2013-10-20 NOTE — Addendum Note (Signed)
Addended by: Agnes Lawrence on: 10/20/2013 09:45 AM   Modules accepted: Orders

## 2013-10-24 LAB — CULTURE, URINE COMPREHENSIVE: Colony Count: 50000

## 2013-10-24 MED ORDER — CIPROFLOXACIN HCL 500 MG PO TABS: 500.0000 mg | ORAL_TABLET | Freq: Two times a day (BID) | ORAL | Status: DC

## 2013-10-24 NOTE — Addendum Note (Signed)
Addended by: Agnes Lawrence on: 10/24/2013 08:34 AM   Modules accepted: Orders

## 2013-10-28 DIAGNOSIS — L570 Actinic keratosis: Secondary | ICD-10-CM | POA: Diagnosis not present

## 2013-11-05 DIAGNOSIS — R3129 Other microscopic hematuria: Secondary | ICD-10-CM | POA: Diagnosis not present

## 2013-11-19 DIAGNOSIS — K7689 Other specified diseases of liver: Secondary | ICD-10-CM | POA: Diagnosis not present

## 2013-11-19 DIAGNOSIS — R3129 Other microscopic hematuria: Secondary | ICD-10-CM | POA: Diagnosis not present

## 2013-11-25 ENCOUNTER — Encounter: Payer: Self-pay | Admitting: *Deleted

## 2013-11-26 DIAGNOSIS — R3129 Other microscopic hematuria: Secondary | ICD-10-CM | POA: Diagnosis not present

## 2013-11-28 ENCOUNTER — Encounter: Payer: Self-pay | Admitting: *Deleted

## 2013-12-01 DIAGNOSIS — Z961 Presence of intraocular lens: Secondary | ICD-10-CM | POA: Diagnosis not present

## 2013-12-02 DIAGNOSIS — D165 Benign neoplasm of lower jaw bone: Secondary | ICD-10-CM | POA: Diagnosis not present

## 2013-12-10 DIAGNOSIS — F331 Major depressive disorder, recurrent, moderate: Secondary | ICD-10-CM | POA: Diagnosis not present

## 2014-01-21 DIAGNOSIS — F331 Major depressive disorder, recurrent, moderate: Secondary | ICD-10-CM | POA: Diagnosis not present

## 2014-01-22 ENCOUNTER — Encounter: Payer: Self-pay | Admitting: Family Medicine

## 2014-01-22 ENCOUNTER — Ambulatory Visit (INDEPENDENT_AMBULATORY_CARE_PROVIDER_SITE_OTHER): Payer: Medicare Other | Admitting: Family Medicine

## 2014-01-22 ENCOUNTER — Other Ambulatory Visit: Payer: Self-pay | Admitting: Family Medicine

## 2014-01-22 VITALS — BP 128/90 | HR 86 | Temp 98.4°F | Ht 62.0 in | Wt 157.0 lb

## 2014-01-22 DIAGNOSIS — F418 Other specified anxiety disorders: Secondary | ICD-10-CM

## 2014-01-22 DIAGNOSIS — L290 Pruritus ani: Secondary | ICD-10-CM

## 2014-01-22 DIAGNOSIS — R1013 Epigastric pain: Secondary | ICD-10-CM | POA: Diagnosis not present

## 2014-01-22 DIAGNOSIS — Z23 Encounter for immunization: Secondary | ICD-10-CM | POA: Diagnosis not present

## 2014-01-22 DIAGNOSIS — R319 Hematuria, unspecified: Secondary | ICD-10-CM | POA: Diagnosis not present

## 2014-01-22 DIAGNOSIS — F341 Dysthymic disorder: Secondary | ICD-10-CM

## 2014-01-22 LAB — POCT URINALYSIS DIPSTICK
BILIRUBIN UA: NEGATIVE
Glucose, UA: NEGATIVE
Ketones, UA: NEGATIVE
Leukocytes, UA: NEGATIVE
NITRITE UA: NEGATIVE
PH UA: 8
Protein, UA: NEGATIVE
Spec Grav, UA: 1.015
Urobilinogen, UA: 0.2

## 2014-01-22 LAB — CBC WITH DIFFERENTIAL/PLATELET
BASOS ABS: 0 10*3/uL (ref 0.0–0.1)
Basophils Relative: 1 % (ref 0.0–3.0)
Eosinophils Absolute: 0.1 10*3/uL (ref 0.0–0.7)
Eosinophils Relative: 2.2 % (ref 0.0–5.0)
HEMATOCRIT: 44.6 % (ref 36.0–46.0)
Hemoglobin: 14.7 g/dL (ref 12.0–15.0)
LYMPHS ABS: 1.4 10*3/uL (ref 0.7–4.0)
Lymphocytes Relative: 29 % (ref 12.0–46.0)
MCHC: 33 g/dL (ref 30.0–36.0)
MCV: 92.4 fl (ref 78.0–100.0)
MONO ABS: 0.4 10*3/uL (ref 0.1–1.0)
Monocytes Relative: 8.7 % (ref 3.0–12.0)
NEUTROS PCT: 59.1 % (ref 43.0–77.0)
Neutro Abs: 2.9 10*3/uL (ref 1.4–7.7)
Platelets: 352 10*3/uL (ref 150.0–400.0)
RBC: 4.83 Mil/uL (ref 3.87–5.11)
RDW: 13.5 % (ref 11.5–15.5)
WBC: 4.9 10*3/uL (ref 4.0–10.5)

## 2014-01-22 LAB — COMPREHENSIVE METABOLIC PANEL
ALT: 23 U/L (ref 0–35)
AST: 27 U/L (ref 0–37)
Albumin: 4.4 g/dL (ref 3.5–5.2)
Alkaline Phosphatase: 47 U/L (ref 39–117)
BILIRUBIN TOTAL: 0.7 mg/dL (ref 0.2–1.2)
BUN: 19 mg/dL (ref 6–23)
CHLORIDE: 103 meq/L (ref 96–112)
CO2: 26 mEq/L (ref 19–32)
Calcium: 9.7 mg/dL (ref 8.4–10.5)
Creatinine, Ser: 0.8 mg/dL (ref 0.4–1.2)
GFR: 72.99 mL/min (ref >60.00)
Glucose, Bld: 80 mg/dL (ref 70–99)
Potassium: 3.9 mEq/L (ref 3.5–5.1)
SODIUM: 137 meq/L (ref 135–145)
Total Protein: 7.6 g/dL (ref 6.0–8.3)

## 2014-01-22 LAB — LIPASE: Lipase: 18 U/L (ref 11.0–59.0)

## 2014-01-22 MED ORDER — HYDROCORTISONE 2.5 % RE CREA: 1.0000 "application " | TOPICAL_CREAM | Freq: Two times a day (BID) | RECTAL | Status: DC

## 2014-01-22 NOTE — Progress Notes (Signed)
Pre visit review using our clinic review tool, if applicable. No additional management support is needed unless otherwise documented below in the visit note. 

## 2014-01-22 NOTE — Patient Instructions (Signed)
-  We have ordered labs or studies at this visit. It can take up to 1-2 weeks for results and processing. We will contact you with instructions IF your results are abnormal. Normal results will be released to your [facility]. If you have not heard from [place] or can not find your results in [facility] in 2 weeks please contact our office.  -follow up with your urologist and psychiatrist  -anusol for rectal itching  -GERD diet and increase Nexium to 40 mg daily - follow up in 2-4 weeks or sooner as needed

## 2014-01-22 NOTE — Progress Notes (Signed)
No chief complaint on file.   HPI:  Crystal Patrick, is a sweet 68 yo F pt with PMH of anxiety, depresion, GERD and overactive bladder here for an acute visit for:  Anxiety/Depression: -husband had appendicitis -has been seeing a counselor -seeing psych, on buspar, prozac and ambien -denies: SI, thoughts of self harm  Anal Pruritis: -x 1 month -feels like yeast infection -tried monistat  -no bleeding, melena, pain -hx hemorrhoids  Hematuria: -seeing urologist, saw urologist last month with cystoscopy -has hematuria yesterday -scheduled for follow up next month -denies frequency,urgency, dysuria, flank pain  -hx h. pylori  GERD/Epigastric pain: -intermittent epigastric pain for a month, almost daily, lasts for a few hours then resolves -does not seem to be associated with meals -has been bad with GERD diet and eating the wrong things -not more at night, has bed tilted -on nexium, had EGD about 6 years ago and has had dilation x2 for stricture -denies: reflux, heartburn fevers, nausea, vomiting, constipation, occ loose stools, blood in stools, dysphagia  ROS: See pertinent positives and negatives per HPI.  Past Medical History  Diagnosis Date  . Anxiety   . Depression   . GERD (gastroesophageal reflux disease)   . Hyperlipidemia   . Overactive bladder 06/02/2013  . Arthritis   . Cancer     squamous  . Chicken pox   . Seasonal allergies   . Hypertension     high blood pressure    Past Surgical History  Procedure Laterality Date  . Tonsillectomy and adenoidectomy    . Bunionectomy Bilateral   . Nasal/sinus endoscopy      Family History  Problem Relation Age of Onset  . Mental illness Mother   . Cancer Father     throat cancer  . Heart disease Father   . Mental illness Father   . Cancer Sister     thyroid    History   Social History  . Marital Status: Married    Spouse Name: N/A    Number of Children: N/A  . Years of Education: N/A   Social  History Main Topics  . Smoking status: Never Smoker   . Smokeless tobacco: None  . Alcohol Use: Yes     Comment: one drink a day  . Drug Use: None  . Sexual Activity: None   Other Topics Concern  . 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      Spiritual Beliefs: Christian, Catholic      Lifestyle: no regular exercise; diet is good              Current outpatient prescriptions:busPIRone (BUSPAR) 10 MG tablet, Take 2 tablets by mouth twice daily, Disp: , Rfl: ;  Cholecalciferol (VITAMIN D3) 2000 UNITS TABS, Take 2,000 Units by mouth daily., Disp: , Rfl: ;  diphenhydramine-acetaminophen (TYLENOL PM EXTRA STRENGTH) 25-500 MG TABS, Take 1 tablet by mouth at bedtime as needed., Disp: , Rfl: ;  esomeprazole (NEXIUM) 20 MG capsule, Take 20 mg by mouth daily at 12 noon., Disp: , Rfl:  fenofibrate 160 MG tablet, Take 1 tablet (160 mg total) by mouth daily., Disp: 90 tablet, Rfl: 3;  FLUoxetine (PROZAC) 40 MG capsule, Take 2 tablets by mouth once daily, Disp: , Rfl: ;  magnesium gluconate (MAGONATE) 500 MG tablet, Take 500 mg by mouth daily., Disp: , Rfl: ;  Potassium Gluconate 595  MG CAPS, Take 595 mg by mouth daily., Disp: , Rfl:  tolterodine (DETROL LA) 4 MG 24 hr capsule, Take 1 capsule (4 mg total) by mouth daily., Disp: 90 capsule, Rfl: 3;  valsartan-hydrochlorothiazide (DIOVAN-HCT) 80-12.5 MG per tablet, Take 1 tablet by mouth daily., Disp: 90 tablet, Rfl: 3;  zolpidem (AMBIEN) 10 MG tablet, Take 10 mg by mouth at bedtime as needed for sleep., Disp: , Rfl:  hydrocortisone (ANUSOL-HC) 2.5 % rectal cream, Place 1 application rectally 2 (two) times daily., Disp: 30 g, Rfl: 0  EXAM:  Filed Vitals:   01/22/14 0841  BP: 128/90  Pulse: 86  Temp: 98.4 F (36.9 C)    Body mass index is 28.71 kg/(m^2).  GENERAL: vitals reviewed and listed above, alert, oriented, appears well hydrated and in no  acute distress  HEENT: atraumatic, conjunttiva clear, no obvious abnormalities on inspection of external nose and ears  NECK: no obvious masses on inspection  LUNGS: clear to auscultation bilaterally, no wheezes, rales or rhonchi, good air movement  CV: HRRR, no peripheral edema  MS: moves all extremities without noticeable abnormality  ABD: BS+, soft, NTTP  RECTAL: mild perianal irritation, no fissures, small internal hemorrhoid  PSYCH: pleasant and cooperative, no obvious depression or anxiety  ASSESSMENT AND PLAN:  Discussed the following assessment and plan:  Need for prophylactic vaccination against Streptococcus pneumoniae (pneumococcus) - Plan: Pneumococcal conjugate vaccine 13-valent  Need for prophylactic vaccination and inoculation against influenza - Plan: Flu Vaccine QUAD 36+ mos PF IM (Fluarix Quad PF)  Depression with anxiety  Abdominal pain, epigastric - Plan: CMP, CBC with Differential, Lipase, Urine culture, Helicobacter pylori antigen det, stool  Anal pruritus - Plan: hydrocortisone (ANUSOL-HC) 2.5 % rectal cream  Hematuria - Plan: Urine culture, POCT urinalysis dipstick  -rectal and abd exam benign -ua and cx - tx if infection, follow up with urologist -cbc, cmp -anusol for small int hemorrhoid -increase nexium to 40mg  per day and follow GERD diet -follow up 1 month -follow up with psych about anx and depression -Patient advised to return or notify a doctor immediately if symptoms worsen or persist or new concerns arise.  Patient Instructions  -We have ordered labs or studies at this visit. It can take up to 1-2 weeks for results and processing. We will contact you with instructions IF your results are abnormal. Normal results will be released to your [facility]. If you have not heard from [place] or can not find your results in [facility] in 2 weeks please contact our office.  -follow up with your urologist and psychiatrist  -anusol for rectal itching  -GERD  diet and increase Nexium to 40 mg daily - follow up in 2-4 weeks or sooner as needed          KIM, HANNAH R.

## 2014-01-24 LAB — HELICOBACTER PYLORI  SPECIAL ANTIGEN: H. PYLORI Antigen: NEGATIVE

## 2014-01-25 LAB — URINE CULTURE: Colony Count: 80000

## 2014-01-27 ENCOUNTER — Encounter: Payer: Self-pay | Admitting: Family Medicine

## 2014-01-27 ENCOUNTER — Ambulatory Visit (INDEPENDENT_AMBULATORY_CARE_PROVIDER_SITE_OTHER): Payer: Medicare Other | Admitting: Family Medicine

## 2014-01-27 VITALS — BP 102/70 | HR 93 | Temp 98.2°F | Ht 62.0 in | Wt 155.0 lb

## 2014-01-27 DIAGNOSIS — K219 Gastro-esophageal reflux disease without esophagitis: Secondary | ICD-10-CM

## 2014-01-27 DIAGNOSIS — K648 Other hemorrhoids: Secondary | ICD-10-CM

## 2014-01-27 DIAGNOSIS — E785 Hyperlipidemia, unspecified: Secondary | ICD-10-CM

## 2014-01-27 DIAGNOSIS — Z0000 Encounter for general adult medical examination without abnormal findings: Secondary | ICD-10-CM

## 2014-01-27 DIAGNOSIS — N318 Other neuromuscular dysfunction of bladder: Secondary | ICD-10-CM

## 2014-01-27 DIAGNOSIS — R82998 Other abnormal findings in urine: Secondary | ICD-10-CM

## 2014-01-27 DIAGNOSIS — R8290 Unspecified abnormal findings in urine: Secondary | ICD-10-CM

## 2014-01-27 DIAGNOSIS — F341 Dysthymic disorder: Secondary | ICD-10-CM | POA: Diagnosis not present

## 2014-01-27 DIAGNOSIS — N3281 Overactive bladder: Secondary | ICD-10-CM

## 2014-01-27 DIAGNOSIS — K649 Unspecified hemorrhoids: Secondary | ICD-10-CM

## 2014-01-27 DIAGNOSIS — F418 Other specified anxiety disorders: Secondary | ICD-10-CM

## 2014-01-27 MED ORDER — CIPROFLOXACIN HCL 500 MG PO TABS: 500.0000 mg | ORAL_TABLET | Freq: Two times a day (BID) | ORAL | Status: DC

## 2014-01-27 MED ORDER — ESOMEPRAZOLE MAGNESIUM 40 MG PO CPDR
40.0000 mg | DELAYED_RELEASE_CAPSULE | Freq: Every day | ORAL | Status: DC
Start: 2014-01-27 — End: 2014-12-14

## 2014-01-27 NOTE — Progress Notes (Signed)
Medicare Annual Preventive Care Visit  (initial annual wellness or annual wellness exam)  Concerns and/or follow up today:  1)hematuria: -bacteria on culture -seeing urologist next month -denies: dysuria, frequency or urgency - but had some gross hematuria  2)hemorrhoid/anal pruritis: -treated with anusol -reports: itching is doing better -denies: pain or itching or blood in stools  3)GERD: -increased PPI, diet advised -reports: symptoms have resolved and she went back to once 1 day dosing -denies: abd pain, nausea, vomting  4)Depression: -in a spell of depression - husband is drinking again -seeing psych and counselor  5)Mild hearing loss: -she has not seen an audiologist  ROS: negative for report of fevers, unintentional weight loss, vision changes, vision loss, hearing loss or change, chest pain, sob, hemoptysis, melena, hematochezia, hematuria, genital discharge or lesions, falls, bleeding or bruising, loc, thoughts of suicide or self harm, memory loss  1.) Patient-completed health risk assessment  - completed and reviewed, see scanned documentation  2.) Review of Medical History: -PMH, PSH, Family History and current specialty and care providers reviewed and updated and listed below  - see scanned in document in chart and below  Past Medical History  Diagnosis Date  . Anxiety   . Depression   . GERD (gastroesophageal reflux disease)   . Hyperlipidemia   . Overactive bladder 06/02/2013  . Arthritis   . Cancer     squamous  . Chicken pox   . Seasonal allergies   . Hypertension     high blood pressure    Past Surgical History  Procedure Laterality Date  . Tonsillectomy and adenoidectomy    . Bunionectomy Bilateral   . Nasal/sinus endoscopy      History   Social History  . Marital Status: Married    Spouse Name: N/A    Number of Children: N/A  . Years of Education: N/A   Occupational History  . Not on file.   Social History Main Topics  . Smoking  status: Never Smoker   . Smokeless tobacco: Not on file  . Alcohol Use: Yes     Comment: one drink a day  . Drug Use: Not on file  . Sexual Activity: Not on file   Other Topics Concern  . 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 YES   History of falling? YES - tripped over step  Any trouble with IADLs - using a phone, using transportation, grocery shopping, preparing meals, doing housework, doing laundry, taking medications and managing money? NO  Advance Directives? YES   See summary of recommendations in Patient Instructions below.  4.) Physical Exam Filed Vitals:   01/27/14 0809  BP: 102/70  Pulse: 93  Temp: 98.2 F (36.8 C)   Estimated body mass index is 28.34 kg/(m^2) as calculated from the following:   Height as of this encounter: 5\' 2"  (1.575 m).   Weight as of this encounter: 155 lb (70.308 kg).  EKG (optional): deferred  General: alert, appear well hydrated and in no acute distress  HEENT: visual acuity grossly intact  CV: HRRR  Lungs: CTA bilaterally  Psych: pleasant and cooperative, no obvious depression or anxiety  Mini Cog: 1. Patient instructed to listen carefully and repeat the following: [place]  2. Clock drawing test was administered: NORMAL      3. Recall of three words 3/3  Scoring:  Patient Score: NEG    See patient instructions for recommendations.  Education and counseling regarding the above review of health provided with a plan for the following: -see scanned patient completed form for further details -fall prevention strategies discussed  -healthy lifestyle discussed -importance  and resources for completing advanced directives discussed -see patient instructions below for any other recommendations provided  4)The following written screening schedule of preventive measures were reviewed with assessment and plan made per below, orders and patient instructions:      AAA screening: N/A     Alcohol screening: done     Obesity Screening and counseling: done     STI screening: declined     Tobacco Screening: done       Pneumococcal (PPSV23 -one dose after 64, one before if risk factors), influenza yearly and hepatitis B vaccines (if high risk - end stage renal disease, IV drugs, homosexual men, live in home for mentally retarded, hemophilia receiving factors) ASSESSMENT/PLAN: doing next year      Screening mammograph (yearly if >40) ASSESSMENT/PLAN: last year - advised to schedule      Screening Pap smear/pelvic exam (q2 years) ASSESSMENT/PLAN: reports screened until 44 and normal      Prostate cancer screening ASSESSMENT/PLAN: n/a      Colorectal cancer screening (FOBT yearly or flex sig q4y or colonoscopy q10y or barium enema q4y) ASSESSMENT/PLAN: refused colonosocpy - had at age 11 and terrible experience, opted for FOBT      Diabetes outpatient self-management training services ASSESSMENT/PLAN: n/a      Bone mass measurements(covered q2y if indicated - estrogen def, osteoporosis, hyperparathyroid, vertebral abnormalities, osteoporosis or steroids) ASSESSMENT/PLAN: reports allergic to all of the medications so refused dexa, treated in the past      Screening for glaucoma(q1y if high risk - diabetes, FH, AA and > 50 or hispanic and > 65) ASSESSMENT/PLAN: sees eye doctor on regular basis      Medical nutritional therapy for individuals with diabetes or renal disease ASSESSMENT/PLAN:n/a      Cardiovascular screening blood tests (lipids q5y) ASSESSMENT/PLAN: refused      Diabetes screening tests ASSESSMENT/PLAN: refused   7.) Summary: -risk factors and  conditions per above assessment were discussed and treatment, recommendations and referrals were offered per documentation above and orders and patient instructions.  Medicare annual wellness visit, initial -see above and screened document  Gastroesophageal reflux disease without esophagitis - Plan: esomeprazole (NEXIUM) 40 MG capsule -back to once per day dosing, dietary changes, follow up if worsens - would need to see GI   Hyperlipidemia -refusing to check, refuses treatement -advised regular exercise and healthy diet, advised of risks  Overactive bladder - Plan: ciprofloxacin (CIPRO) 500 MG tablet -cipro for blood in urine and bacteria -follow up with urologist  Depression with anxiety -seeing pysch, no SI or thoughts of self harm -advised adding exercise  Other hemorrhoids -anusol, follow up if persists in 2 weeks  Abnormal urine - Plan: ciprofloxacin (CIPRO) 500 MG tablet  Patient Instructions   -PLEASE SIGN UP FOR MYCHART TODAY   We recommend the following healthy lifestyle measures: - eat a healthy diet consisting of lots of vegetables, fruits, beans, nuts,  seeds, healthy meats such as white chicken and fish and whole grains.  - avoid fried foods, fast food, processed foods, sodas, red meet and other fattening foods.  - get a least 150 minutes of aerobic exercise per week.   Vitamin D3 1000 IU dialy; 1200mg  calcium from food plus or minus supplement daily  Stool cards once hemorrhoid recovered  Schedule mammogram  Follow up in: 3 months

## 2014-01-27 NOTE — Progress Notes (Signed)
Pre visit review using our clinic review tool, if applicable. No additional management support is needed unless otherwise documented below in the visit note. 

## 2014-01-27 NOTE — Patient Instructions (Signed)
-  PLEASE SIGN UP FOR MYCHART TODAY   We recommend the following healthy lifestyle measures: - eat a healthy diet consisting of lots of vegetables, fruits, beans, nuts, seeds, healthy meats such as white chicken and fish and whole grains.  - avoid fried foods, fast food, processed foods, sodas, red meet and other fattening foods.  - get a least 150 minutes of aerobic exercise per week.   Vitamin D3 1000 IU dialy; 1200mg  calcium from food plus or minus supplement daily  Stool cards once hemorrhoid recovered  Schedule mammogram  Follow up in: 3 months

## 2014-02-10 ENCOUNTER — Ambulatory Visit: Payer: Medicare Other | Admitting: Family Medicine

## 2014-02-11 DIAGNOSIS — F331 Major depressive disorder, recurrent, moderate: Secondary | ICD-10-CM | POA: Diagnosis not present

## 2014-03-03 DIAGNOSIS — L570 Actinic keratosis: Secondary | ICD-10-CM | POA: Diagnosis not present

## 2014-03-05 DIAGNOSIS — R8299 Other abnormal findings in urine: Secondary | ICD-10-CM | POA: Diagnosis not present

## 2014-03-05 DIAGNOSIS — R312 Other microscopic hematuria: Secondary | ICD-10-CM | POA: Diagnosis not present

## 2014-03-12 DIAGNOSIS — F331 Major depressive disorder, recurrent, moderate: Secondary | ICD-10-CM | POA: Diagnosis not present

## 2014-03-23 ENCOUNTER — Encounter: Payer: Self-pay | Admitting: Family Medicine

## 2014-04-28 ENCOUNTER — Encounter: Payer: Self-pay | Admitting: Family Medicine

## 2014-04-28 ENCOUNTER — Ambulatory Visit (INDEPENDENT_AMBULATORY_CARE_PROVIDER_SITE_OTHER): Payer: Medicare Other | Admitting: Family Medicine

## 2014-04-28 VITALS — BP 130/84 | HR 78 | Temp 97.9°F | Ht 62.0 in | Wt 156.9 lb

## 2014-04-28 DIAGNOSIS — I10 Essential (primary) hypertension: Secondary | ICD-10-CM

## 2014-04-28 DIAGNOSIS — E785 Hyperlipidemia, unspecified: Secondary | ICD-10-CM

## 2014-04-28 DIAGNOSIS — K6289 Other specified diseases of anus and rectum: Secondary | ICD-10-CM

## 2014-04-28 DIAGNOSIS — K219 Gastro-esophageal reflux disease without esophagitis: Secondary | ICD-10-CM | POA: Diagnosis not present

## 2014-04-28 DIAGNOSIS — F418 Other specified anxiety disorders: Secondary | ICD-10-CM | POA: Diagnosis not present

## 2014-04-28 LAB — LIPID PANEL
CHOL/HDL RATIO: 4
CHOLESTEROL: 214 mg/dL — AB (ref 0–200)
HDL: 48.6 mg/dL (ref >39.00)
LDL CALC: 146 mg/dL — AB (ref 0–99)
NONHDL: 165.4
Triglycerides: 99 mg/dL (ref 0.0–149.0)
VLDL: 19.8 mg/dL (ref 0.0–40.0)

## 2014-04-28 LAB — BASIC METABOLIC PANEL WITH GFR
BUN: 15 mg/dL (ref 6–23)
CO2: 24 meq/L (ref 19–32)
Calcium: 9.1 mg/dL (ref 8.4–10.5)
Chloride: 103 meq/L (ref 96–112)
Creatinine, Ser: 0.9 mg/dL (ref 0.4–1.2)
GFR: 69.08 mL/min
Glucose, Bld: 97 mg/dL (ref 70–99)
Potassium: 3.7 meq/L (ref 3.5–5.1)
Sodium: 139 meq/L (ref 135–145)

## 2014-04-28 NOTE — Progress Notes (Signed)
Pre visit review using our clinic review tool, if applicable. No additional management support is needed unless otherwise documented below in the visit note. 

## 2014-04-28 NOTE — Progress Notes (Signed)
HPI:  HTN: -meds: valsartan-hctz -doing well - takes potassium and magnesium chronically with these meds -denies: CP, SOB, swelling, HA  HLD: -intolerant to statins in the past -no regular exercise  Hx hematuria: -seeing urologist  -denies: dysuria, frequency or urgency - but had some gross hematuria  hemorrhoid/anal pruritis: -has had ongoing anal pain and pruritis -treated with anusol last visit and advised to call in 2 weeks - she did not and reports ongoing pain and itching despite anusol use, no bleeding -denies: constipation, blood in stools, abd pain, vaginal symptoms -reports has not had a colonoscopy in a very long time as not well sedated for initial and nervous about this -rectal exam last visit showed small int hemorrhoid -she is agreeable to seeing GI as persistent, not improving  GERD: -on Nexium, diet advised -reports: stable -denies: abd pain, nausea, vomting  Depression/Anxiety: -in a spell of depression - husband a stressor, mother's health failing -seeing psych and counselor -she is on buspar, prozac and ambien -no SI or thoughts of self harm  ROS: See pertinent positives and negatives per HPI.  Past Medical History  Diagnosis Date  . Anxiety   . Depression   . GERD (gastroesophageal reflux disease)   . Hyperlipidemia   . Overactive bladder 06/02/2013  . Arthritis   . Cancer     squamous  . Chicken pox   . Seasonal allergies   . Hypertension     high blood pressure    Past Surgical History  Procedure Laterality Date  . Tonsillectomy and adenoidectomy    . Bunionectomy Bilateral   . Nasal/sinus endoscopy      Family History  Problem Relation Age of Onset  . Mental illness Mother   . Cancer Father     throat cancer  . Heart disease Father   . Mental illness Father   . Cancer Sister     thyroid    History   Social History  . Marital Status: Married    Spouse Name: N/A    Number of Children: N/A  . Years of Education: N/A    Social History Main Topics  . Smoking status: Never Smoker   . Smokeless tobacco: None  . Alcohol Use: Yes     Comment: one drink a day  . Drug Use: None  . Sexual Activity: None   Other Topics Concern  . 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      Spiritual Beliefs: Christian, Catholic      Lifestyle: no regular exercise; diet is good              Current outpatient prescriptions: busPIRone (BUSPAR) 30 MG tablet, Take 30 mg by mouth 2 (two) times daily., Disp: , Rfl: ;  Cholecalciferol (VITAMIN D3) 2000 UNITS TABS, Take 2,000 Units by mouth daily., Disp: , Rfl: ;  diphenhydramine-acetaminophen (TYLENOL PM EXTRA STRENGTH) 25-500 MG TABS, Take 1 tablet by mouth at bedtime as needed., Disp: , Rfl:  esomeprazole (NEXIUM) 40 MG capsule, Take 1 capsule (40 mg total) by mouth daily at 12 noon., Disp: 90 capsule, Rfl: 3;  fenofibrate 160 MG tablet, Take 1 tablet (160 mg total) by mouth daily., Disp: 90 tablet, Rfl: 3;  FLUoxetine (PROZAC) 40 MG capsule, Take 2 tablets by mouth once daily, Disp: , Rfl: ;  hydrocortisone (ANUSOL-HC) 2.5 % rectal cream, Place 1 application rectally 2 (  two) times daily., Disp: 30 g, Rfl: 0 magnesium gluconate (MAGONATE) 500 MG tablet, Take 500 mg by mouth daily., Disp: , Rfl: ;  Potassium Gluconate 595 MG CAPS, Take 595 mg by mouth daily., Disp: , Rfl: ;  Probiotic Product (PROBIOTIC DAILY PO), Take by mouth., Disp: , Rfl: ;  tolterodine (DETROL LA) 4 MG 24 hr capsule, Take 1 capsule (4 mg total) by mouth daily., Disp: 90 capsule, Rfl: 3 valsartan-hydrochlorothiazide (DIOVAN-HCT) 80-12.5 MG per tablet, Take 1 tablet by mouth daily., Disp: 90 tablet, Rfl: 3;  zolpidem (AMBIEN) 10 MG tablet, Take 10 mg by mouth at bedtime as needed for sleep., Disp: , Rfl:   EXAM:  Filed Vitals:   04/28/14 1008  BP: 130/84  Pulse: 78  Temp: 97.9 F (36.6 C)    Body mass  index is 28.69 kg/(m^2).  GENERAL: vitals reviewed and listed above, alert, oriented, appears well hydrated and in no acute distress  HEENT: atraumatic, conjunttiva clear, no obvious abnormalities on inspection of external nose and ears  NECK: no obvious masses on inspection  LUNGS: clear to auscultation bilaterally, no wheezes, rales or rhonchi, good air movement  CV: HRRR, no peripheral edema  RECTAL: refused  MS: moves all extremities without noticeable abnormality  PSYCH: pleasant and cooperative, no obvious depression or anxiety  ASSESSMENT AND PLAN:  Discussed the following assessment and plan:  Depression with anxiety -seeing psychiatrist for this   Hyperlipidemia - Plan: Lipid panel -recheck FASTING labs, intolerant to statin, lifestyle recs advised  Essential hypertension - Plan: Basic metabolic panel -stable  Gastroesophageal reflux disease without esophagitis -stable  Rectal pain - Plan: Ambulatory referral to Gastroenterology -we discussed possible serious and likely etiologies, workup and treatment, treatment risks and return precautions -after this discussion, Mirenda opted for eval with GI -of course, we advised Shalayah  to return or notify a doctor immediately if symptoms worsen or persist or new concerns arise.  -Patient advised to return or notify a doctor immediately if symptoms worsen or persist or new concerns arise.  Patient Instructions  BEFORE YOU LEAVE: -labs  We recommend the following healthy lifestyle measures: - eat a healthy diet consisting of lots of vegetables, fruits, beans, nuts, seeds, healthy meats such as white chicken and fish and whole grains.  - avoid fried foods, fast food, processed foods, sodas, red meet and other fattening foods.  - get a least 150 minutes of aerobic exercise per week.       Colin Benton R.

## 2014-04-28 NOTE — Patient Instructions (Signed)
BEFORE YOU LEAVE: -labs  We recommend the following healthy lifestyle measures: - eat a healthy diet consisting of lots of vegetables, fruits, beans, nuts, seeds, healthy meats such as white chicken and fish and whole grains.  - avoid fried foods, fast food, processed foods, sodas, red meet and other fattening foods.  - get a least 150 minutes of aerobic exercise per week.   

## 2014-04-29 DIAGNOSIS — F331 Major depressive disorder, recurrent, moderate: Secondary | ICD-10-CM | POA: Diagnosis not present

## 2014-05-06 DIAGNOSIS — F331 Major depressive disorder, recurrent, moderate: Secondary | ICD-10-CM | POA: Diagnosis not present

## 2014-06-01 ENCOUNTER — Encounter: Payer: Self-pay | Admitting: Gastroenterology

## 2014-06-25 ENCOUNTER — Ambulatory Visit (INDEPENDENT_AMBULATORY_CARE_PROVIDER_SITE_OTHER): Payer: Medicare Other | Admitting: Family Medicine

## 2014-06-25 ENCOUNTER — Encounter: Payer: Self-pay | Admitting: Family Medicine

## 2014-06-25 VITALS — BP 124/82 | HR 80 | Temp 98.4°F | Ht 62.0 in | Wt 156.6 lb

## 2014-06-25 DIAGNOSIS — R1310 Dysphagia, unspecified: Secondary | ICD-10-CM | POA: Diagnosis not present

## 2014-06-25 DIAGNOSIS — K219 Gastro-esophageal reflux disease without esophagitis: Secondary | ICD-10-CM

## 2014-06-25 DIAGNOSIS — R1012 Left upper quadrant pain: Secondary | ICD-10-CM | POA: Diagnosis not present

## 2014-06-25 NOTE — Progress Notes (Signed)
HPI:  Acute visit for:  Abd Pain/dysphagia: -started about 1 month ago -daily, dull ache in L mid and upper abd - very mild - only notices when lie down -her acid reflux has been worse despite daily 40mg  of osomeprazole - having daily dysphagia with solids - feels like gets stuck in lower esophagus - feels like though getting worse can weight until appointment with GI -Hx hiatal hernia and esophageal stricture requiring dilation in the past per her report -lots of gas and occ bloating -denies: constipation, diarrhea, nausea, vomiting, hematochezia, melena, dysuria, fevers, weight loss On ROC: last colonoscopy 17 years ago, had EGD and swallow study in 2003 with small hiatal hernia and transient delay   ROS: See pertinent positives and negatives per HPI.  Past Medical History  Diagnosis Date  . Anxiety   . Depression   . GERD (gastroesophageal reflux disease)   . Hyperlipidemia   . Overactive bladder 06/02/2013  . Arthritis   . Cancer     squamous  . Chicken pox   . Seasonal allergies   . Hypertension     high blood pressure    Past Surgical History  Procedure Laterality Date  . Tonsillectomy and adenoidectomy    . Bunionectomy Bilateral   . Nasal/sinus endoscopy      Family History  Problem Relation Age of Onset  . Mental illness Mother   . Cancer Father     throat cancer  . Heart disease Father   . Mental illness Father   . Cancer Sister     thyroid    History   Social History  . Marital Status: Married    Spouse Name: N/A    Number of Children: N/A  . Years of Education: N/A   Social History Main Topics  . Smoking status: Never Smoker   . Smokeless tobacco: None  . Alcohol Use: Yes     Comment: one drink a day  . Drug Use: None  . Sexual Activity: None   Other Topics Concern  . 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  busPIRone (BUSPAR) 30 MG tablet, Take 30 mg by mouth 2 (two) times daily., Disp: , Rfl:  .  Cholecalciferol (VITAMIN D3) 2000 UNITS TABS, Take 2,000 Units by mouth daily., Disp: , Rfl:  .  diphenhydramine-acetaminophen (TYLENOL PM EXTRA STRENGTH) 25-500 MG TABS, Take 1 tablet by mouth at bedtime as needed., Disp: , Rfl:  .  esomeprazole (NEXIUM) 40 MG capsule, Take 1 capsule (40 mg total) by mouth daily at 12 noon., Disp: 90 capsule, Rfl: 3 .  fenofibrate 160 MG tablet, Take 1 tablet (160 mg total) by mouth daily., Disp: 90 tablet, Rfl: 3 .  FLUoxetine (PROZAC) 40 MG capsule, Take 2 tablets by mouth once daily, Disp: , Rfl:  .  hydrocortisone (ANUSOL-HC) 2.5 % rectal cream, Place 1 application rectally 2 (two) times daily., Disp: 30 g, Rfl: 0 .  magnesium gluconate (MAGONATE) 500 MG tablet, Take 500 mg by mouth daily., Disp: , Rfl:  .  Potassium Gluconate 595 MG CAPS, Take 595 mg by mouth daily., Disp: , Rfl:  .  Probiotic Product (  PROBIOTIC DAILY PO), Take by mouth., Disp: , Rfl:  .  tolterodine (DETROL LA) 4 MG 24 hr capsule, Take 1 capsule (4 mg total) by mouth daily., Disp: 90 capsule, Rfl: 3 .  valsartan-hydrochlorothiazide (DIOVAN-HCT) 80-12.5 MG per tablet, Take 1 tablet by mouth daily., Disp: 90 tablet, Rfl: 3 .  zolpidem (AMBIEN) 10 MG tablet, Take 10 mg by mouth at bedtime as needed for sleep., Disp: , Rfl:   EXAM:  Filed Vitals:   06/25/14 0758  BP: 124/82  Pulse: 80  Temp: 98.4 F (36.9 C)    Body mass index is 28.64 kg/(m^2).  GENERAL: vitals reviewed and listed above, alert, oriented, appears well hydrated and in no acute distress  HEENT: atraumatic, conjunttiva clear, no obvious abnormalities on inspection of external nose and ears  NECK: no obvious masses on inspection  LUNGS: clear to auscultation bilaterally, no wheezes, rales or  rhonchi, good air movement  ABD: BS+, soft, NTTP, no rebound or guarding  CV: HRRR, no peripheral edema  MS: moves all extremities without noticeable abnormality  PSYCH: pleasant and cooperative, no obvious depression or anxiety  ASSESSMENT AND PLAN:  Discussed the following assessment and plan:  Dysphagia  Left upper quadrant pain  Gastroesophageal reflux disease without esophagitis  -seeing GI in a few weeks, will call to update their office of new complaints and see if they can get her in sooner if availablitiy - needs EGD and colonoscopy, possible imaging if unrevealing  -return and emergent precautions -Patient advised to return or notify a doctor immediately if symptoms worsen or persist or new concerns arise.  There are no Patient Instructions on file for this visit.   Colin Benton R.

## 2014-06-25 NOTE — Progress Notes (Signed)
Pre visit review using our clinic review tool, if applicable. No additional management support is needed unless otherwise documented below in the visit note. 

## 2014-07-20 ENCOUNTER — Encounter: Payer: Self-pay | Admitting: Gastroenterology

## 2014-07-20 ENCOUNTER — Ambulatory Visit (INDEPENDENT_AMBULATORY_CARE_PROVIDER_SITE_OTHER): Payer: Medicare Other | Admitting: Gastroenterology

## 2014-07-20 VITALS — BP 132/80 | HR 88 | Ht 61.5 in | Wt 156.4 lb

## 2014-07-20 DIAGNOSIS — R1013 Epigastric pain: Secondary | ICD-10-CM

## 2014-07-20 DIAGNOSIS — R1314 Dysphagia, pharyngoesophageal phase: Secondary | ICD-10-CM

## 2014-07-20 DIAGNOSIS — R194 Change in bowel habit: Secondary | ICD-10-CM | POA: Diagnosis not present

## 2014-07-20 DIAGNOSIS — R1032 Left lower quadrant pain: Secondary | ICD-10-CM

## 2014-07-20 MED ORDER — PEG-KCL-NACL-NASULF-NA ASC-C 100 G PO SOLR: 1.0000 | Freq: Once | ORAL | Status: DC

## 2014-07-20 NOTE — Patient Instructions (Addendum)
You have been scheduled for a Barium Esophogram at [facility] (1st floor of the hospital) on 07/24/14 at 11:00am. Please arrive 15 minutes prior to your appointment for registration. Make certain not to have anything to eat or drink 3 hours prior to your test. If you need to reschedule for any reason, please contact radiology at [phone] to do so. __________________________________________________________________ A barium swallow is an examination that concentrates on views of the esophagus. This tends to be a double contrast exam (barium and two liquids which, when combined, create a gas to distend the wall of the oesophagus) or single contrast (non-ionic iodine based). The study is usually tailored to your symptoms so a good history is essential. Attention is paid during the study to the form, structure and configuration of the esophagus, looking for functional disorders (such as aspiration, dysphagia, achalasia, motility and reflux) EXAMINATION You may be asked to change into a gown, depending on the type of swallow being performed. A radiologist and radiographer will perform the procedure. The radiologist will advise you of the type of contrast selected for your procedure and direct you during the exam. You will be asked to stand, sit or lie in several different positions and to hold a small amount of fluid in your mouth before being asked to swallow while the imaging is performed .In some instances you may be asked to swallow barium coated marshmallows to assess the motility of a solid food bolus. The exam can be recorded as a digital or video fluoroscopy procedure. POST PROCEDURE It will take 1-2 days for the barium to pass through your system. To facilitate this, it is important, unless otherwise directed, to increase your fluids for the next 24-48hrs and to resume your normal diet.  This test typically takes about 30 minutes to  perform. __________________________________________________________________________________   Crystal Patrick have been scheduled for an endoscopy and colonoscopy. Please follow the written instructions given to you at your visit today. Please pick up your prep supplies at the pharmacy within the next 1-3 days. If you use inhalers (even only as needed), please bring them with you on the day of your procedure. Your physician has requested that you go to www.startemmi.com and enter the access code given to you at your visit today. This web site gives a general overview about your procedure. However, you should still follow specific instructions given to you by our office regarding your preparation for the procedure.   Thank you for choosing me and [facility].  Pricilla Riffle. Dagoberto Ligas., MD., Marval Regal

## 2014-07-20 NOTE — Progress Notes (Signed)
History of Present Illness: This is a 69 year old female referred by Dr. Colin Benton for evaluation of chest pain, LLQ pain, epigastric pain and dysphasia. She has chronic GERD and relates a history of an esophageal stricture that required dilation on 2 occasions 10-15 years ago. States she had a colonoscopy in 2000 that was normal. Unfortunately I do not have those records available today. She relates frequent epigastric pain and mid chest pain. The symptoms have not been controlled by Nexium 40 mg twice daily. She's currently taking Nexium once daily with about the same frequency and severity of symptoms. She states she's been under more stress recently and is concerned some of her symptoms may be related to stress. Over the past few months she relates difficulty swallowing almost on a daily basis primarily to liquids. She denies coughing and choking. She has had a change in bowel habits recently with urgent soft stools and intermittent left lower quadrant pain. Denies weight loss, constipation, diarrhea, change in stool caliber, melena, hematochezia, nausea, vomiting.   Allergies  Allergen Reactions  . Statins     Liver failue   Outpatient Prescriptions Prior to Visit  Medication Sig Dispense Refill  . busPIRone (BUSPAR) 30 MG tablet Take 30 mg by mouth 2 (two) times daily.    . Cholecalciferol (VITAMIN D3) 2000 UNITS TABS Take 2,000 Units by mouth daily.    . diphenhydramine-acetaminophen (TYLENOL PM EXTRA STRENGTH) 25-500 MG TABS Take 1 tablet by mouth at bedtime as needed.    Marland Kitchen esomeprazole (NEXIUM) 40 MG capsule Take 1 capsule (40 mg total) by mouth daily at 12 noon. 90 capsule 3  . fenofibrate 160 MG tablet Take 1 tablet (160 mg total) by mouth daily. 90 tablet 3  . FLUoxetine (PROZAC) 40 MG capsule Take 2 tablets by mouth once daily    . hydrocortisone (ANUSOL-HC) 2.5 % rectal cream Place 1 application rectally 2 (two) times daily. 30 g 0  . magnesium gluconate (MAGONATE) 500 MG tablet  Take 500 mg by mouth daily.    . Potassium Gluconate 595 MG CAPS Take 595 mg by mouth daily.    . Probiotic Product (PROBIOTIC DAILY PO) Take by mouth.    . tolterodine (DETROL LA) 4 MG 24 hr capsule Take 1 capsule (4 mg total) by mouth daily. 90 capsule 3  . valsartan-hydrochlorothiazide (DIOVAN-HCT) 80-12.5 MG per tablet Take 1 tablet by mouth daily. 90 tablet 3  . zolpidem (AMBIEN) 10 MG tablet Take 10 mg by mouth at bedtime as needed for sleep.     No facility-administered medications prior to visit.   Past Medical History  Diagnosis Date  . Anxiety   . Depression   . GERD (gastroesophageal reflux disease)   . Hyperlipidemia   . Overactive bladder 06/02/2013  . Arthritis   . Squamous cell carcinoma   . Chicken pox   . Seasonal allergies   . Hypertension     high blood pressure  . Status post dilation of esophageal narrowing   . Peptic ulcer   . Hiatal hernia   . Liver failure     due to Baycol   Past Surgical History  Procedure Laterality Date  . Tonsillectomy and adenoidectomy    . Bunionectomy Bilateral      1 foot x 2,   . Nasal/sinus endoscopy      x 3  . Appendectomy  1964   History   Social History  . Marital Status: Married    Spouse Name:  N/A  . Number of Children: 3  . Years of Education: N/A   Occupational History  . home maker    Social History Main Topics  . Smoking status: Never Smoker   . Smokeless tobacco: Never Used  . Alcohol Use: 0.0 oz/week    0 Standard drinks or equivalent per week     Comment: one drink a day  . Drug Use: No  . Sexual Activity: Not on file   Other Topics Concern  . 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      Spiritual Beliefs: Christian, Catholic      Lifestyle: no regular exercise; diet is good             Family History  Problem Relation Age of Onset  . Throat cancer Father   . Heart disease Father   .  Thyroid cancer Sister   . Ovarian cancer Paternal Aunt   . Prostate cancer Brother   . Prostate cancer Maternal Uncle     x 2  . Bladder Cancer Maternal Uncle   . Diabetes Daughter   . Dementia Mother       Review of Systems: Pertinent positive and negative review of systems were noted in the above HPI section. All other review of systems were otherwise negative.   Physical Exam: General: Well developed, well nourished, no acute distress Head: Normocephalic and atraumatic Eyes:  sclerae anicteric, EOMI Ears: Normal auditory acuity Mouth: No deformity or lesions Neck: Supple, no masses or thyromegaly Lungs: Clear throughout to auscultation Heart: Regular rate and rhythm; no murmurs, rubs or bruits Abdomen: Soft, mild epigastric and left lower quadrant tenderness without rebound or guarding and non distended. No masses, hepatosplenomegaly or hernias noted. Normal Bowel sounds Rectal: Deferred to colonoscopy Musculoskeletal: Symmetrical with no gross deformities  Skin: No lesions on visible extremities Pulses:  Normal pulses noted Extremities: No clubbing, cyanosis, edema or deformities noted Neurological: Alert oriented x 4, grossly nonfocal Cervical Nodes:  No significant cervical adenopathy Inguinal Nodes: No significant inguinal adenopathy Psychological:  Alert and cooperative. Normal mood and affect   Assessment and Recommendations:  1. Chest pain, epigastric pain and liquid dysphagia. R/O motility disorder, esophagitis, GERD, ulcer. Continue esomeprazole 40 mg daily. Schedule barium esophagram. Schedule EGD. The risks (including bleeding, perforation, infection, missed lesions, medication reactions and possible hospitalization or surgery if complications occur), benefits, and alternatives to endoscopy with possible biopsy and possible dilation were discussed with the patient and they consent to proceed.   2. Change in bowel habits, left lower quadrant pain. Possible  functional complaints. Last colonoscopy in 2000 per patient report. Schedule colonoscopy. The risks (including bleeding, perforation, infection, missed lesions, medication reactions and possible hospitalization or surgery if complications occur), benefits, and alternatives to colonoscopy with possible biopsy and possible polypectomy were discussed with the patient and they consent to proceed.     cc: [name], DO [address] [place], [place] [address]

## 2014-07-24 ENCOUNTER — Ambulatory Visit (HOSPITAL_COMMUNITY)
Admission: RE | Admit: 2014-07-24 | Discharge: 2014-07-24 | Disposition: A | Discharge status: Home / Self Care | Payer: Medicare Other | Source: Ambulatory Visit | Attending: Gastroenterology | Admitting: Gastroenterology

## 2014-07-24 DIAGNOSIS — R1310 Dysphagia, unspecified: Secondary | ICD-10-CM | POA: Insufficient documentation

## 2014-07-24 DIAGNOSIS — R1314 Dysphagia, pharyngoesophageal phase: Secondary | ICD-10-CM

## 2014-07-28 ENCOUNTER — Encounter: Payer: Self-pay | Admitting: Family Medicine

## 2014-07-28 ENCOUNTER — Ambulatory Visit (INDEPENDENT_AMBULATORY_CARE_PROVIDER_SITE_OTHER): Payer: Medicare Other | Admitting: Family Medicine

## 2014-07-28 VITALS — BP 130/88 | HR 86 | Temp 98.0°F | Ht 62.0 in | Wt 157.1 lb

## 2014-07-28 DIAGNOSIS — N3281 Overactive bladder: Secondary | ICD-10-CM

## 2014-07-28 DIAGNOSIS — I10 Essential (primary) hypertension: Secondary | ICD-10-CM | POA: Diagnosis not present

## 2014-07-28 DIAGNOSIS — E785 Hyperlipidemia, unspecified: Secondary | ICD-10-CM | POA: Diagnosis not present

## 2014-07-28 DIAGNOSIS — K219 Gastro-esophageal reflux disease without esophagitis: Secondary | ICD-10-CM

## 2014-07-28 DIAGNOSIS — Z23 Encounter for immunization: Secondary | ICD-10-CM

## 2014-07-28 DIAGNOSIS — F418 Other specified anxiety disorders: Secondary | ICD-10-CM

## 2014-07-28 NOTE — Progress Notes (Signed)
HPI:  HM: -pneumococcal 23: wants to do today -dexa: declined - does not want to do ever  HTN: -meds: valsartan-hctz -doing well - takes potassium and magnesium chronically with these meds -denies: CP, SOB, swelling, HA  HLD: -intolerant to statins in the past -regular exercise - 20-40 minutes of walking daily; diet is poor  Hx hematuria: -seeing urologist  -denies: dysuria, frequency or urgency - but had some gross hematuria  GERD/Dysphagia/constipation: -on PPI, seeing GI - recent notes reviewed -reports: stable -denies: abd pain, nausea, vomting  Depression/Anxiety: -in a spell of depression - husband a stressor, mother's health failing -seeing psych and counselor - seeing them tomorrow, did get a puppy - this is helpful -she is on buspar, prozac and ambien -no SI or thoughts of self harm  ROS: See pertinent positives and negatives per HPI.  Past Medical History  Diagnosis Date  . Anxiety   . Depression   . GERD (gastroesophageal reflux disease)   . Hyperlipidemia   . Overactive bladder 06/02/2013  . Arthritis   . Squamous cell carcinoma   . Chicken pox   . Seasonal allergies   . Hypertension     high blood pressure  . Status post dilation of esophageal narrowing   . Peptic ulcer   . Hiatal hernia   . Liver failure     due to Baycol    Past Surgical History  Procedure Laterality Date  . Tonsillectomy and adenoidectomy    . Bunionectomy Bilateral      1 foot x 2,   . Nasal/sinus endoscopy      x 3  . Appendectomy  1964    Family History  Problem Relation Age of Onset  . Throat cancer Father   . Heart disease Father   . Thyroid cancer Sister   . Ovarian cancer Paternal Aunt   . Prostate cancer Brother   . Prostate cancer Maternal Uncle     x 2  . Bladder Cancer Maternal Uncle   . Diabetes Daughter   . Dementia Mother     History   Social History  . Marital Status: Married    Spouse Name: N/A  . Number of Children: 3  . Years of  Education: N/A   Occupational History  . home maker    Social History Main Topics  . Smoking status: Never Smoker   . Smokeless tobacco: Never Used  . Alcohol Use: 0.0 oz/week    0 Standard drinks or equivalent per week     Comment: one drink a day  . Drug Use: No  . Sexual Activity: Not on file   Other Topics Concern  . 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  busPIRone (BUSPAR) 30 MG tablet, Take 30 mg by mouth 2 (two) times daily., Disp: , Rfl:  .  Cholecalciferol (VITAMIN D3) 2000 UNITS TABS, Take 2,000 Units by mouth daily., Disp: , Rfl:  .  diphenhydramine-acetaminophen (TYLENOL PM EXTRA STRENGTH) 25-500 MG TABS, Take 1 tablet by mouth at bedtime as needed., Disp: , Rfl:  .  esomeprazole (NEXIUM) 40 MG capsule, Take 1 capsule (40 mg total) by mouth daily  at 12 noon., Disp: 90 capsule, Rfl: 3 .  fenofibrate 160 MG tablet, Take 1 tablet (160 mg total) by mouth daily., Disp: 90 tablet, Rfl: 3 .  FLUoxetine (PROZAC) 40 MG capsule, Take 2 tablets by mouth once daily, Disp: , Rfl:  .  magnesium gluconate (MAGONATE) 500 MG tablet, Take 500 mg by mouth daily., Disp: , Rfl:  .  peg 3350 powder (MOVIPREP) 100 G SOLR, Take 1 kit (200 g total) by mouth once., Disp: 1 kit, Rfl: 0 .  Potassium Gluconate 595 MG CAPS, Take 595 mg by mouth daily., Disp: , Rfl:  .  Probiotic Product (PROBIOTIC DAILY PO), Take by mouth., Disp: , Rfl:  .  tolterodine (DETROL LA) 4 MG 24 hr capsule, Take 1 capsule (4 mg total) by mouth daily., Disp: 90 capsule, Rfl: 3 .  valsartan-hydrochlorothiazide (DIOVAN-HCT) 80-12.5 MG per tablet, Take 1 tablet by mouth daily., Disp: 90 tablet, Rfl: 3 .  zolpidem (AMBIEN) 10 MG tablet, Take 10 mg by mouth at bedtime as needed for  sleep., Disp: , Rfl:   EXAM:  Filed Vitals:   07/28/14 0934  BP: 130/88  Pulse: 86  Temp: 98 F (36.7 C)    Body mass index is 28.73 kg/(m^2).  GENERAL: vitals reviewed and listed above, alert, oriented, appears well hydrated and in no acute distress  HEENT: atraumatic, conjunttiva clear, no obvious abnormalities on inspection of external nose and ears  NECK: no obvious masses on inspection  LUNGS: clear to auscultation bilaterally, no wheezes, rales or rhonchi, good air movement  CV: HRRR, no peripheral edema  MS: moves all extremities without noticeable abnormality  PSYCH: pleasant and cooperative, no obvious depression or anxiety  ASSESSMENT AND PLAN:  Discussed the following assessment and plan:  Depression with anxiety  Essential hypertension  Gastroesophageal reflux disease without esophagitis  Hyperlipidemia  Overactive bladder  Need for prophylactic vaccination against Streptococcus pneumoniae (pneumococcus)  -Patient advised to return or notify a doctor immediately if symptoms worsen or persist or new concerns arise.  Patient Instructions  BEFORE YOU LEAVE: -pneumococcal 23 -follow up in 3-4 months  We recommend the following healthy lifestyle measures: - eat a healthy diet consisting of lots of vegetables, fruits, beans, nuts, seeds, healthy meats such as white chicken and fish and whole grains.  - avoid fried foods, fast food, processed foods, sodas, red meet and other fattening foods.  - get a least 150 minutes of aerobic exercise per week.       Colin Benton R.

## 2014-07-28 NOTE — Progress Notes (Signed)
Pre visit review using our clinic review tool, if applicable. No additional management support is needed unless otherwise documented below in the visit note. 

## 2014-07-28 NOTE — Patient Instructions (Addendum)
BEFORE YOU LEAVE: -pneumococcal 23 -follow up in 3-4 months  We recommend the following healthy lifestyle measures: - eat a healthy diet consisting of lots of vegetables, fruits, beans, nuts, seeds, healthy meats such as white chicken and fish and whole grains.  - avoid fried foods, fast food, processed foods, sodas, red meet and other fattening foods.  - get a least 150 minutes of aerobic exercise per week.

## 2014-07-29 ENCOUNTER — Telehealth: Payer: Self-pay | Admitting: Family Medicine

## 2014-07-29 DIAGNOSIS — F331 Major depressive disorder, recurrent, moderate: Secondary | ICD-10-CM | POA: Diagnosis not present

## 2014-07-29 NOTE — Telephone Encounter (Signed)
emmi emailed °

## 2014-08-03 ENCOUNTER — Other Ambulatory Visit: Payer: Self-pay | Admitting: Family Medicine

## 2014-08-05 ENCOUNTER — Ambulatory Visit (AMBULATORY_SURGERY_CENTER): Payer: Medicare Other | Admitting: Gastroenterology

## 2014-08-05 ENCOUNTER — Encounter: Payer: Self-pay | Admitting: Gastroenterology

## 2014-08-05 VITALS — BP 112/58 | HR 59 | Temp 98.5°F | Resp 8 | Ht 61.0 in | Wt 156.0 lb

## 2014-08-05 DIAGNOSIS — R079 Chest pain, unspecified: Secondary | ICD-10-CM | POA: Diagnosis not present

## 2014-08-05 DIAGNOSIS — R1319 Other dysphagia: Secondary | ICD-10-CM

## 2014-08-05 DIAGNOSIS — F329 Major depressive disorder, single episode, unspecified: Secondary | ICD-10-CM | POA: Diagnosis not present

## 2014-08-05 DIAGNOSIS — D123 Benign neoplasm of transverse colon: Secondary | ICD-10-CM | POA: Diagnosis not present

## 2014-08-05 DIAGNOSIS — R1032 Left lower quadrant pain: Secondary | ICD-10-CM | POA: Diagnosis not present

## 2014-08-05 DIAGNOSIS — R1310 Dysphagia, unspecified: Secondary | ICD-10-CM | POA: Diagnosis not present

## 2014-08-05 DIAGNOSIS — E669 Obesity, unspecified: Secondary | ICD-10-CM | POA: Diagnosis not present

## 2014-08-05 DIAGNOSIS — I10 Essential (primary) hypertension: Secondary | ICD-10-CM | POA: Diagnosis not present

## 2014-08-05 DIAGNOSIS — R194 Change in bowel habit: Secondary | ICD-10-CM

## 2014-08-05 DIAGNOSIS — R1314 Dysphagia, pharyngoesophageal phase: Secondary | ICD-10-CM

## 2014-08-05 DIAGNOSIS — R1013 Epigastric pain: Secondary | ICD-10-CM

## 2014-08-05 MED ORDER — GLYCOPYRROLATE 1 MG PO TABS: 1.0000 mg | ORAL_TABLET | Freq: Two times a day (BID) | ORAL | Status: DC

## 2014-08-05 MED ORDER — SODIUM CHLORIDE 0.9 % IV SOLN: 500.0000 mL | INTRAVENOUS | Status: DC

## 2014-08-05 NOTE — Op Note (Signed)
[facility]  [address] [place], [id number]   COLONOSCOPY PROCEDURE REPORT  PATIENT: Crystal Patrick, Crystal Patrick  MR#: [id number] BIRTHDATE: 1945-09-13 , 71  yrs. old GENDER: female ENDOSCOPIST: Ladene Artist, MD, [facility] REFERRED MV:[id number] Maudie Mercury, D.O. PROCEDURE DATE:  08/05/2014 PROCEDURE:   Colonoscopy, diagnostic and Colonoscopy with biopsy First Screening Colonoscopy - Avg.  risk and is 50 yrs.  old or older - No.  Prior Negative Screening - Now for repeat screening. N/A  History of Adenoma - Now for follow-up colonoscopy & has been > or = to 3 yrs.  N/A ASA CLASS:   Class II INDICATIONS:Clinically significant diarrhea of unexplained origin, Colorectal Neoplasm Risk Assessment for this procedure is average risk, LLQ pain and change in bowel habits. MEDICATIONS: Monitored anesthesia care and Propofol 250 mg IV DESCRIPTION OF PROCEDURE:   After the risks benefits and alternatives of the procedure were thoroughly explained, informed consent was obtained.  The digital rectal exam revealed no abnormalities of the rectum.   The LB [id number]  endoscope was introduced through the anus and advanced to the cecum, which was identified by both the appendix and ileocecal valve. No adverse events experienced.   The quality of the prep was excellent. (MoviPrep was used)  The instrument was then slowly withdrawn as the colon was fully examined.    COLON FINDINGS: Two sessile polyps measuring 4-5 mm in size were found in the transverse colon.  Polypectomies were performed with cold forceps.  The resection was complete, the polyp tissue was completely retrieved and sent to histology.   The examination was otherwise normal.  Retroflexed views revealed internal Grade I hemorrhoids. The time to cecum = 3.7 Withdrawal time = 10.2   The scope was withdrawn and the procedure completed. COMPLICATIONS: There were no immediate complications.  ENDOSCOPIC IMPRESSION: 1.   Two sessile  polyps in the transverse colon; polypectomies performed with cold forceps 2.   Grade l internal hemorrhoids  RECOMMENDATIONS: 1.  Await pathology results 2.  Repeat colonoscopy in 5 years if polyp(s) adenomatous; otherwise 10 years 3.  Suspected IBS. Glycopyrrolate 1 mg po bid, 1 year of refills  eSigned:  Ladene Artist, MD, [facility] 08/05/2014 2:34 PM

## 2014-08-05 NOTE — Progress Notes (Signed)
Report to PACU, RN, vss, BBS= Clear.  

## 2014-08-05 NOTE — Patient Instructions (Signed)
Colon polyps removed today and hemorrhoids seen.  Handouts given on polyps, hemorrhoids, GERD, and post-dilation diet.  Pick up your new medication from pharmacy, take as directed. Call us with any questions or concerns. Thank you!  YOU HAD AN ENDOSCOPIC PROCEDURE TODAY AT [facility]:   Refer to the procedure report that was given to you for any specific questions about what was found during the examination.  If the procedure report does not answer your questions, please call your gastroenterologist to clarify.  If you requested that your care partner not be given the details of your procedure findings, then the procedure report has been included in a sealed envelope for you to review at your convenience later.  YOU SHOULD EXPECT: Some feelings of bloating in the abdomen. Passage of more gas than usual.  Walking can help get rid of the air that was put into your GI tract during the procedure and reduce the bloating. If you had a lower endoscopy (such as a colonoscopy or flexible sigmoidoscopy) you may notice spotting of blood in your stool or on the toilet paper. If you underwent a bowel prep for your procedure, you may not have a normal bowel movement for a few days.  Please Note:  You might notice some irritation and congestion in your nose or some drainage.  This is from the oxygen used during your procedure.  There is no need for concern and it should clear up in a day or so.  SYMPTOMS TO REPORT IMMEDIATELY:   Following lower endoscopy (colonoscopy or flexible sigmoidoscopy):  Excessive amounts of blood in the stool  Significant tenderness or worsening of abdominal pains  Swelling of the abdomen that is new, acute  Fever of 100F or higher   Following upper endoscopy (EGD)  Vomiting of blood or coffee ground material  New chest pain or pain under the shoulder blades  Painful or persistently difficult swallowing  New shortness of breath  Fever of 100F or  higher  Black, tarry-looking stools  For urgent or emergent issues, a gastroenterologist can be reached at any hour by calling [phone].   DIET: dilation diet today, nothing by mouth until 3:30 pm, then clear liquids for 1 hour, then soft diet rest of today. Resume regular diet tomorrow.  Drink plenty of fluids but you should avoid alcoholic beverages for 24 hours.  ACTIVITY:  You should plan to take it easy for the rest of today and you should NOT DRIVE or use heavy machinery until tomorrow (because of the sedation medicines used during the test).    FOLLOW UP: Our staff will call the number listed on your records the next business day following your procedure to check on you and address any questions or concerns that you may have regarding the information given to you following your procedure. If we do not reach you, we will leave a message.  However, if you are feeling well and you are not experiencing any problems, there is no need to return our call.  We will assume that you have returned to your regular daily activities without incident.  If any biopsies were taken you will be contacted by phone or by letter within the next 1-3 weeks.  Please call us at [phone] if you have not heard about the biopsies in 3 weeks.    SIGNATURES/CONFIDENTIALITY: You and/or your care partner have signed paperwork which will be entered into your electronic medical record.  These signatures attest to the  fact that that the information above on your After Visit Summary has been reviewed and is understood.  Full responsibility of the confidentiality of this discharge information lies with you and/or your care-partner.

## 2014-08-05 NOTE — Progress Notes (Signed)
Called to room to assist during endoscopic procedure.  Patient ID and intended procedure confirmed with present staff. Received instructions for my participation in the procedure from the performing physician.  

## 2014-08-05 NOTE — Op Note (Signed)
[facility]  [address]. [place], [id number]   ENDOSCOPY PROCEDURE REPORT  PATIENT: Crystal Patrick, Crystal Patrick  MR#: [id number] BIRTHDATE: Mar 13, 1946 , 8  yrs. old GENDER: female ENDOSCOPIST: Ladene Artist, MD, [facility] REFERRED BY:  Colin Benton, D.O. PROCEDURE DATE:  08/05/2014 PROCEDURE:  EGD w/ wire guided (savary) dilation ASA CLASS:     Class II INDICATIONS:  dysphagia, chest pain, and epigastric pain. MEDICATIONS: Monitored anesthesia care, Residual sedation present, and Propofol 70 mg IV TOPICAL ANESTHETIC: none DESCRIPTION OF PROCEDURE: After the risks benefits and alternatives of the procedure were thoroughly explained, informed consent was obtained.  The LB [id number] endoscope was introduced through the mouth and advanced to the second portion of the duodenum , Without limitations.  The instrument was slowly withdrawn as the mucosa was fully examined.    ESOPHAGUS: The mucosa of the esophagus appeared normal.  The esophagus was dilated using a 52mm (51Fr) savary dilator over guidewire for dysphagia without a stricture. No heme or resistance noted. STOMACH: The mucosa of the stomach appeared normal. DUODENUM: The duodenal mucosa showed no abnormalities.  Retroflexed views revealed no abnormalities.     The scope was then withdrawn from the patient and the procedure completed.  COMPLICATIONS: There were no immediate complications.  ENDOSCOPIC IMPRESSION: 1.   The EGD appeared normal  RECOMMENDATIONS: 1.  No cause for chest pain, epigastric pain or dysphagia noted 2.  Anti-reflux regimen 3.  Continue PPI  eSigned:  Ladene Artist, MD, [facility] 08/05/2014 2:40 PM

## 2014-08-06 ENCOUNTER — Telehealth: Payer: Self-pay

## 2014-08-06 NOTE — Telephone Encounter (Signed)
  Follow up Call-  Call back number 08/05/2014  Post procedure Call Back phone  # [phone]  Permission to leave phone message Yes     Patient questions:  Do you have a fever, pain , or abdominal swelling? No. Pain Score  0 *  Have you tolerated food without any problems? Yes.    Have you been able to return to your normal activities? Yes.    Do you have any questions about your discharge instructions: Diet   No. Medications  No. Follow up visit  No.  Do you have questions or concerns about your Care? No.  Actions: * If pain score is 4 or above: No action needed, pain <4.

## 2014-08-07 ENCOUNTER — Telehealth: Payer: Self-pay | Admitting: Gastroenterology

## 2014-08-07 NOTE — Telephone Encounter (Signed)
Patient would like an alternative to glycopyrrolate.  Please advise

## 2014-08-07 NOTE — Telephone Encounter (Signed)
Patient notified of Dr. Lynne Leader response.  She will call back for any additional questions or concerns

## 2014-08-07 NOTE — Telephone Encounter (Signed)
All antispasmotics used for IBS have a similar side effect profile.  In my experience patients rarely have side effects from this medication. The list of side effect are only potential side effects.

## 2014-08-10 ENCOUNTER — Encounter: Payer: Self-pay | Admitting: Gastroenterology

## 2014-08-22 ENCOUNTER — Emergency Department (HOSPITAL_COMMUNITY)
Admission: EM | Admit: 2014-08-22 | Discharge: 2014-08-22 | Disposition: A | Discharge status: Home / Self Care | Payer: Medicare Other | Attending: Emergency Medicine | Admitting: Emergency Medicine

## 2014-08-22 ENCOUNTER — Emergency Department (HOSPITAL_COMMUNITY): Payer: Medicare Other

## 2014-08-22 ENCOUNTER — Encounter (HOSPITAL_COMMUNITY): Payer: Self-pay | Admitting: Emergency Medicine

## 2014-08-22 DIAGNOSIS — K219 Gastro-esophageal reflux disease without esophagitis: Secondary | ICD-10-CM | POA: Insufficient documentation

## 2014-08-22 DIAGNOSIS — F329 Major depressive disorder, single episode, unspecified: Secondary | ICD-10-CM | POA: Diagnosis not present

## 2014-08-22 DIAGNOSIS — R51 Headache: Secondary | ICD-10-CM | POA: Diagnosis not present

## 2014-08-22 DIAGNOSIS — Z8739 Personal history of other diseases of the musculoskeletal system and connective tissue: Secondary | ICD-10-CM | POA: Insufficient documentation

## 2014-08-22 DIAGNOSIS — Z8639 Personal history of other endocrine, nutritional and metabolic disease: Secondary | ICD-10-CM | POA: Diagnosis not present

## 2014-08-22 DIAGNOSIS — Z79899 Other long term (current) drug therapy: Secondary | ICD-10-CM | POA: Insufficient documentation

## 2014-08-22 DIAGNOSIS — Y9289 Other specified places as the place of occurrence of the external cause: Secondary | ICD-10-CM | POA: Insufficient documentation

## 2014-08-22 DIAGNOSIS — S79912A Unspecified injury of left hip, initial encounter: Secondary | ICD-10-CM | POA: Insufficient documentation

## 2014-08-22 DIAGNOSIS — S20211A Contusion of right front wall of thorax, initial encounter: Secondary | ICD-10-CM | POA: Diagnosis not present

## 2014-08-22 DIAGNOSIS — Y9389 Activity, other specified: Secondary | ICD-10-CM | POA: Diagnosis not present

## 2014-08-22 DIAGNOSIS — I10 Essential (primary) hypertension: Secondary | ICD-10-CM | POA: Insufficient documentation

## 2014-08-22 DIAGNOSIS — Y998 Other external cause status: Secondary | ICD-10-CM | POA: Insufficient documentation

## 2014-08-22 DIAGNOSIS — S43401A Unspecified sprain of right shoulder joint, initial encounter: Secondary | ICD-10-CM

## 2014-08-22 DIAGNOSIS — S4991XA Unspecified injury of right shoulder and upper arm, initial encounter: Secondary | ICD-10-CM | POA: Diagnosis not present

## 2014-08-22 DIAGNOSIS — S299XXA Unspecified injury of thorax, initial encounter: Secondary | ICD-10-CM | POA: Diagnosis present

## 2014-08-22 DIAGNOSIS — F419 Anxiety disorder, unspecified: Secondary | ICD-10-CM | POA: Insufficient documentation

## 2014-08-22 DIAGNOSIS — W01198A Fall on same level from slipping, tripping and stumbling with subsequent striking against other object, initial encounter: Secondary | ICD-10-CM | POA: Diagnosis not present

## 2014-08-22 DIAGNOSIS — Z8619 Personal history of other infectious and parasitic diseases: Secondary | ICD-10-CM | POA: Diagnosis not present

## 2014-08-22 DIAGNOSIS — Z8711 Personal history of peptic ulcer disease: Secondary | ICD-10-CM | POA: Diagnosis not present

## 2014-08-22 DIAGNOSIS — Z87448 Personal history of other diseases of urinary system: Secondary | ICD-10-CM | POA: Insufficient documentation

## 2014-08-22 DIAGNOSIS — R0781 Pleurodynia: Secondary | ICD-10-CM | POA: Diagnosis not present

## 2014-08-22 DIAGNOSIS — M25511 Pain in right shoulder: Secondary | ICD-10-CM | POA: Diagnosis not present

## 2014-08-22 DIAGNOSIS — Z85828 Personal history of other malignant neoplasm of skin: Secondary | ICD-10-CM | POA: Diagnosis not present

## 2014-08-22 MED ORDER — CYCLOBENZAPRINE HCL 10 MG PO TABS: 10.0000 mg | ORAL_TABLET | Freq: Two times a day (BID) | ORAL | Status: DC | PRN

## 2014-08-22 MED ORDER — HYDROCODONE-ACETAMINOPHEN 5-325 MG PO TABS: 1.0000 | ORAL_TABLET | Freq: Four times a day (QID) | ORAL | Status: DC | PRN

## 2014-08-22 NOTE — ED Notes (Signed)
On thurs she tripped over the dishwasher door twisted around and fell on floor. Has pain to RT humerus RT rib near breast area, lt hip pain, Hit head but no LOC. No blood  Thinners,denies sob, no bruising noted, Alert x4, denies any chest pain. Took husband Hydrocodone PTA at 0700 am.  Walking from triage.

## 2014-08-22 NOTE — Discharge Instructions (Signed)

## 2014-08-22 NOTE — ED Provider Notes (Signed)
CSN: [id number]     Arrival date & time 08/22/14  3154 History   First MD Initiated Contact with Patient 08/22/14 0831     Chief Complaint  Patient presents with  . Rib Injury    rt   . Breast Pain    rt   . Hip Pain    lt      (Consider location/radiation/quality/duration/timing/severity/associated sxs/prior Treatment) Patient is a 69 y.o. female presenting with fall. The history is provided by the patient.  Fall This is a new problem. The current episode started 2 days ago. The problem occurs constantly. The problem has been gradually worsening. Associated symptoms include chest pain and headaches. Pertinent negatives include no abdominal pain and no shortness of breath. Associated symptoms comments: Right shoulder pain and left hip pain. The symptoms are aggravated by walking and standing (movement). The symptoms are relieved by narcotics. The treatment provided no relief.    Past Medical History  Diagnosis Date  . Anxiety   . Depression   . GERD (gastroesophageal reflux disease)   . Hyperlipidemia   . Overactive bladder 06/02/2013  . Arthritis   . Squamous cell carcinoma   . Chicken pox   . Seasonal allergies   . Hypertension     high blood pressure  . Status post dilation of esophageal narrowing   . Peptic ulcer   . Hiatal hernia   . Liver failure     due to [place]col   Past Surgical History  Procedure Laterality Date  . Tonsillectomy and adenoidectomy    . Bunionectomy Bilateral      1 foot x 2,   . Nasal/sinus endoscopy      x 3  . Appendectomy  1964   Family History  Problem Relation Age of Onset  . Throat cancer Father   . Heart disease Father   . Thyroid cancer Sister   . Ovarian cancer Paternal Aunt   . Prostate cancer Brother   . Prostate cancer Maternal Uncle     x 2  . Bladder Cancer Maternal Uncle   . Diabetes Daughter   . Dementia Mother    History  Substance Use Topics  . Smoking status: Never Smoker   . Smokeless tobacco: Never Used  .  Alcohol Use: 0.0 oz/week    0 Standard drinks or equivalent per week     Comment: one drink a day   OB History    No data available     Review of Systems  Respiratory: Negative for shortness of breath.   Cardiovascular: Positive for chest pain.  Gastrointestinal: Negative for abdominal pain.  Neurological: Positive for headaches.  All other systems reviewed and are negative.     Allergies  Statins  Home Medications   Prior to Admission medications   Medication Sig Start Date End Date Taking? Authorizing Provider  busPIRone (BUSPAR) 30 MG tablet Take 30 mg by mouth 2 (two) times daily.    Historical Provider, MD  Cholecalciferol (VITAMIN D3) 2000 UNITS TABS Take 2,000 Units by mouth daily.    Historical Provider, MD  DETROL LA 4 MG 24 hr capsule TAKE 1 CAPSULE DAILY 08/03/14   [name], DO  diphenhydramine-acetaminophen (TYLENOL PM EXTRA STRENGTH) 25-500 MG TABS Take 1 tablet by mouth at bedtime as needed.    Historical Provider, MD  esomeprazole (NEXIUM) 40 MG capsule Take 1 capsule (40 mg total) by mouth daily at 12 noon. 01/27/14   [name], DO  fenofibrate 160  MG tablet TAKE 1 TABLET DAILY 08/03/14   [name], DO  FLUoxetine (PROZAC) 40 MG capsule Take 2 tablets by mouth once daily    Historical Provider, MD  glycopyrrolate (ROBINUL) 1 MG tablet Take 1 tablet (1 mg total) by mouth 2 (two) times daily. 08/05/14   Ladene Artist, MD  magnesium gluconate (MAGONATE) 500 MG tablet Take 500 mg by mouth daily.    Historical Provider, MD  Potassium Gluconate 595 MG CAPS Take 595 mg by mouth daily.    Historical Provider, MD  Probiotic Product (PROBIOTIC DAILY PO) Take by mouth.    Historical Provider, MD  valsartan-hydrochlorothiazide (DIOVAN-HCT) 80-12.5 MG per tablet TAKE 1 TABLET DAILY 08/03/14   [name], DO  zolpidem (AMBIEN) 10 MG tablet Take 10 mg by mouth at bedtime as needed for sleep.    Historical Provider, MD   BP 110/78 mmHg  Pulse 60  Temp(Src) 98 F (36.7  C)  Resp 16  Ht 5\' 2"  (1.575 m)  Wt 155 lb (70.308 kg)  BMI 28.34 kg/m2  SpO2 99% Physical Exam  Constitutional: She is oriented to person, place, and time. She appears well-developed and well-nourished. No distress.  HENT:  Head: Normocephalic and atraumatic.  Mouth/Throat: Oropharynx is clear and moist.  Eyes: Conjunctivae and EOM are normal. Pupils are equal, round, and reactive to light.  Neck: Normal range of motion. Neck supple. No spinous process tenderness and no muscular tenderness present.  Cardiovascular: Normal rate, regular rhythm and intact distal pulses.   No murmur heard. Pulmonary/Chest: Effort normal and breath sounds normal. No respiratory distress. She has no wheezes. She has no rales. She exhibits tenderness and bony tenderness.    Abdominal: Soft. She exhibits no distension. There is no tenderness. There is no rebound and no guarding.  Musculoskeletal: She exhibits no edema.       Right shoulder: She exhibits decreased range of motion, tenderness and pain. She exhibits normal pulse and normal strength.       Left shoulder: Normal.       Right hip: Normal.       Right knee: Normal.       Left knee: Normal.       Right ankle: Normal.       Left ankle: Normal.       Arms: Pain with flexion of the hip but able to bear weight, stand and walk  Neurological: She is alert and oriented to person, place, and time.  Skin: Skin is warm and dry. No rash noted. No erythema.  Psychiatric: She has a normal mood and affect. Her behavior is normal.  Nursing note and vitals reviewed.   ED Course  Procedures (including critical care time) Labs Review Labs Reviewed - No data to display  Imaging Review Dg Ribs Unilateral W/chest Right  08/22/2014   CLINICAL DATA:  [place] 3 days ago at that house, tripping over the DISH washer door. Fell on the right side with persistent pain.  EXAM: RIGHT RIBS AND CHEST - 3+ VIEW  COMPARISON:  None.  FINDINGS: Heart size is normal. Mediastinal  shadows are normal. The lungs are clear. No pneumothorax or hemothorax.  Right rib details show a marker in place in the region of concern. No visible rib fracture.  IMPRESSION: No active cardiopulmonary disease.  No visible rib fracture.   Electronically Signed   By: Nelson Chimes M.D.   On: 08/22/2014 09:14   Dg Shoulder Right  08/22/2014  CLINICAL DATA:  Tripped over the DISH washer 3 days ago. Fell on the right side. Pain and limited range of motion.  EXAM: RIGHT SHOULDER - 2+ VIEW  COMPARISON:  None.  FINDINGS: No evidence of fracture or dislocation. No degenerative change. Normal humeral acromial distance.  IMPRESSION: Normal.  No traumatic finding.   Electronically Signed   By: Nelson Chimes M.D.   On: 08/22/2014 09:15     EKG Interpretation None      MDM   Final diagnoses:  Chest wall contusion, right, initial encounter  Shoulder sprain, right, initial encounter   Patient presents after a mechanical fall 2 days ago when she tripped over the dishwasher door quality was down. Since that time she has had severe right shoulder and rib pain as well as mild left hip pain and a headache. She did hit her head on the floor mildly but denies LOC and currently takes no blood thinners. She has no neck tenderness on exam. Bilateral breath sounds and denies shortness of breath however concern for possible rib fracture. She does have pain in the upper humerus and before meals joint of the shoulder as well. Patient was able to ambulate with low suspicion for hip fracture at this time. No knee or ankle tenderness.  Chest and shoulder imaging pending.  9:49 AM Imaging neg.  Will d/c home   Blanchie Dessert, MD 08/22/14 [phone]

## 2014-09-01 DIAGNOSIS — D225 Melanocytic nevi of trunk: Secondary | ICD-10-CM | POA: Diagnosis not present

## 2014-09-01 DIAGNOSIS — L821 Other seborrheic keratosis: Secondary | ICD-10-CM | POA: Diagnosis not present

## 2014-09-01 DIAGNOSIS — L820 Inflamed seborrheic keratosis: Secondary | ICD-10-CM | POA: Diagnosis not present

## 2014-09-01 DIAGNOSIS — D1801 Hemangioma of skin and subcutaneous tissue: Secondary | ICD-10-CM | POA: Diagnosis not present

## 2014-09-01 DIAGNOSIS — L570 Actinic keratosis: Secondary | ICD-10-CM | POA: Diagnosis not present

## 2014-09-01 DIAGNOSIS — L814 Other melanin hyperpigmentation: Secondary | ICD-10-CM | POA: Diagnosis not present

## 2014-09-17 ENCOUNTER — Encounter: Payer: Self-pay | Admitting: Family Medicine

## 2014-09-17 ENCOUNTER — Ambulatory Visit (INDEPENDENT_AMBULATORY_CARE_PROVIDER_SITE_OTHER): Payer: Medicare Other | Admitting: Family Medicine

## 2014-09-17 VITALS — BP 132/90 | HR 100 | Temp 98.3°F | Ht 62.0 in | Wt 158.8 lb

## 2014-09-17 DIAGNOSIS — R300 Dysuria: Secondary | ICD-10-CM | POA: Diagnosis not present

## 2014-09-17 LAB — POCT URINALYSIS DIPSTICK
Bilirubin, UA: NEGATIVE
Glucose, UA: NEGATIVE
Ketones, UA: NEGATIVE
Nitrite, UA: NEGATIVE
PROTEIN UA: NEGATIVE
SPEC GRAV UA: 1.02
Urobilinogen, UA: 0.2
pH, UA: 6

## 2014-09-17 MED ORDER — NITROFURANTOIN MONOHYD MACRO 100 MG PO CAPS: 100.0000 mg | ORAL_CAPSULE | Freq: Two times a day (BID) | ORAL | Status: DC

## 2014-09-17 NOTE — Patient Instructions (Signed)
Before you leave we will check your urine.  If it looks like an infection or if the culture shows infection we will treat this with an antibiotic. Please follow up if symptoms worsening or persist or new symptoms occur and for your routine follow up.

## 2014-09-17 NOTE — Progress Notes (Signed)
Pre visit review using our clinic review tool, if applicable. No additional management support is needed unless otherwise documented below in the visit note. 

## 2014-09-17 NOTE — Progress Notes (Signed)
HPI:  Dysuria: -started a week or two ago -symptoms: odor to urine, urinary frequency -denies: fevers, abd or flank pain, NVD, hematuria, vaginal discharge  ROS: See pertinent positives and negatives per HPI.  Past Medical History  Diagnosis Date  . Anxiety   . Depression   . GERD (gastroesophageal reflux disease)   . Hyperlipidemia   . Overactive bladder 06/02/2013  . Arthritis   . Squamous cell carcinoma   . Chicken pox   . Seasonal allergies   . Hypertension     high blood pressure  . Status post dilation of esophageal narrowing   . Peptic ulcer   . Hiatal hernia   . Liver failure     due to [place]col    Past Surgical History  Procedure Laterality Date  . Tonsillectomy and adenoidectomy    . Bunionectomy Bilateral      1 foot x 2,   . Nasal/sinus endoscopy      x 3  . Appendectomy  1964    Family History  Problem Relation Age of Onset  . Throat cancer Father   . Heart disease Father   . Thyroid cancer Sister   . Ovarian cancer Paternal Aunt   . Prostate cancer Brother   . Prostate cancer Maternal Uncle     x 2  . Bladder Cancer Maternal Uncle   . Diabetes Daughter   . Dementia Mother     History   Social History  . Marital Status: Married    Spouse Name: N/A  . Number of Children: 3  . Years of Education: N/A   Occupational History  . home maker    Social History Main Topics  . Smoking status: Never Smoker   . Smokeless tobacco: Never Used  . Alcohol Use: 0.0 oz/week    0 Standard drinks or equivalent per week     Comment: one drink a day  . Drug Use: No  . Sexual Activity: Not on file   Other Topics Concern  . 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  busPIRone (BUSPAR) 30 MG tablet, Take  30 mg by mouth 2 (two) times daily., Disp: , Rfl:  .  Cholecalciferol (VITAMIN D3) 2000 UNITS TABS, Take 2,000 Units by mouth daily., Disp: , Rfl:  .  cyclobenzaprine (FLEXERIL) 10 MG tablet, Take 1 tablet (10 mg total) by mouth 2 (two) times daily as needed for muscle spasms., Disp: 20 tablet, Rfl: 0 .  DETROL LA 4 MG 24 hr capsule, TAKE 1 CAPSULE DAILY, Disp: 90 capsule, Rfl: 1 .  diphenhydramine-acetaminophen (TYLENOL PM EXTRA STRENGTH) 25-500 MG TABS, Take 1 tablet by mouth at bedtime as needed., Disp: , Rfl:  .  esomeprazole (NEXIUM) 40 MG capsule, Take 1 capsule (40 mg total) by mouth daily at 12 noon., Disp: 90 capsule, Rfl: 3 .  fenofibrate 160 MG tablet, TAKE 1 TABLET DAILY, Disp: 90 tablet, Rfl: 1 .  FLUoxetine (PROZAC) 40 MG capsule, Take 2 tablets by mouth once daily, Disp: , Rfl:  .  HYDROcodone-acetaminophen (NORCO/VICODIN) 5-325 MG per tablet, Take 1-2 tablets by mouth every 6 (six) hours as needed., Disp: 10 tablet,  Rfl: 0 .  magnesium gluconate (MAGONATE) 500 MG tablet, Take 500 mg by mouth daily., Disp: , Rfl:  .  Potassium Gluconate 595 MG CAPS, Take 595 mg by mouth daily., Disp: , Rfl:  .  Probiotic Product (PROBIOTIC DAILY PO), Take by mouth., Disp: , Rfl:  .  valsartan-hydrochlorothiazide (DIOVAN-HCT) 80-12.5 MG per tablet, TAKE 1 TABLET DAILY, Disp: 90 tablet, Rfl: 1 .  zolpidem (AMBIEN) 10 MG tablet, Take 10 mg by mouth at bedtime as needed for sleep., Disp: , Rfl:   EXAM:  Filed Vitals:   09/17/14 0801  BP: 132/90  Pulse: 100  Temp: 98.3 F (36.8 C)    Body mass index is 29.04 kg/(m^2).  GENERAL: vitals reviewed and listed above, alert, oriented, appears well hydrated and in no acute distress  HEENT: atraumatic, conjunttiva clear, no obvious abnormalities on inspection of external nose and ears  NECK: no obvious masses on inspection  LUNGS: clear to auscultation bilaterally, no wheezes, rales or rhonchi, good air movement  CV: HRRR, no peripheral  edema  ABD: Soft, no cva TTP  MS: moves all extremities without noticeable abnormality  PSYCH: pleasant and cooperative, no obvious depression or anxiety  ASSESSMENT AND PLAN:  Discussed the following assessment and plan:  Dysuria - Plan: POC Urinalysis Dipstick, Culture, Urine  -+leuk and tr bld - opted for empiric tx with macrobid -Patient advised to return or notify a doctor immediately if symptoms worsen or persist or new concerns arise.  Patient Instructions  Before you leave we will check your urine.  If it looks like an infection or if the culture shows infection we will treat this with an antibiotic. Please follow up if symptoms worsening or persist or new symptoms occur and for your routine follow up.     Colin Benton R.

## 2014-09-19 LAB — URINE CULTURE
Colony Count: NO GROWTH
Organism ID, Bacteria: NO GROWTH

## 2014-09-21 ENCOUNTER — Telehealth: Payer: Self-pay | Admitting: Family Medicine

## 2014-09-21 NOTE — Telephone Encounter (Signed)
Patient informed and states she will call her urologist first.

## 2014-09-21 NOTE — Telephone Encounter (Signed)
Pt called to say that she was in here last week for a UTI. She said it has been day 5 and still has the symptons. She is asking if something else can be called in   [address]

## 2014-09-21 NOTE — Telephone Encounter (Signed)
Left a message for the pt to return my call.  

## 2014-09-22 DIAGNOSIS — R3989 Other symptoms and signs involving the genitourinary system: Secondary | ICD-10-CM | POA: Diagnosis not present

## 2014-09-30 DIAGNOSIS — F331 Major depressive disorder, recurrent, moderate: Secondary | ICD-10-CM | POA: Diagnosis not present

## 2014-10-01 ENCOUNTER — Ambulatory Visit: Payer: Medicare Other | Admitting: Family Medicine

## 2014-10-13 ENCOUNTER — Encounter: Payer: Self-pay | Admitting: *Deleted

## 2014-10-30 ENCOUNTER — Ambulatory Visit (INDEPENDENT_AMBULATORY_CARE_PROVIDER_SITE_OTHER): Payer: Medicare Other | Admitting: Family Medicine

## 2014-10-30 ENCOUNTER — Encounter: Payer: Self-pay | Admitting: Family Medicine

## 2014-10-30 VITALS — BP 138/94 | HR 85 | Temp 97.8°F | Ht 62.0 in | Wt 157.6 lb

## 2014-10-30 DIAGNOSIS — E785 Hyperlipidemia, unspecified: Secondary | ICD-10-CM | POA: Diagnosis not present

## 2014-10-30 DIAGNOSIS — K219 Gastro-esophageal reflux disease without esophagitis: Secondary | ICD-10-CM

## 2014-10-30 DIAGNOSIS — F39 Unspecified mood [affective] disorder: Secondary | ICD-10-CM

## 2014-10-30 DIAGNOSIS — I10 Essential (primary) hypertension: Secondary | ICD-10-CM | POA: Insufficient documentation

## 2014-10-30 DIAGNOSIS — J302 Other seasonal allergic rhinitis: Secondary | ICD-10-CM | POA: Insufficient documentation

## 2014-10-30 NOTE — Progress Notes (Signed)
Pre visit review using our clinic review tool, if applicable. No additional management support is needed unless otherwise documented below in the visit note. 

## 2014-10-30 NOTE — Patient Instructions (Signed)
BEFORE YOU LEAVE: -Medicare annual wellness visit in October  Try zyrtec of allegra for the allergy symptoms and follow up in 2 weeks if symptoms persist  Take blood pressure medications every day  We recommend the following healthy lifestyle measures: - eat a healthy diet consisting of lots of vegetables, fruits, beans, nuts, seeds, healthy meats such as white chicken and fish   - avoid fried foods, starches, sweets, fast food, processed foods, sodas, red meet and other fattening foods.  - get a least 150 minutes of aerobic exercise per week.

## 2014-10-30 NOTE — Progress Notes (Signed)
HPI:  HTN: -meds: valsartan-hctz (didnot take her medication today - forgets at least several days per week) -doing well - takes potassium and magnesium chronically with these meds -denies: CP, SOB, swelling, HA  HLD: -intolerant to statins in the past and has refused any medications for this or further eval -regular exercise - 20-40 minutes of walking daily; diet is poor  Allergies: -recently worsened the last few weeks -PND, watery eyes, sneezing, itchy eyes and mild upper chest tickles -does not take anything -denies: SOB, Wheezing, coughing, fevers  GERD/Dysphagia/constipation - IBS: -on PPI, seeing GI - recent notes reviewed -reports: stable -denies: abd pain, nausea, vomting  Depression/Anxiety: -reports stable -in a spell of depression - husband a stressor -seeing psych and counselor - -she is on buspar, prozac and ambien -no SI or thoughts of self harm  ROS: See pertinent positives and negatives per HPI.  Past Medical History  Diagnosis Date  . Hypertension   . Anxiety and depression     sees psychiatrist for this  . GERD (gastroesophageal reflux disease)     with hiatal hernia, esophageal stricture  . Hyperlipidemia     has refused treatment or further evaluation other then lifestyle changes  . Overactive bladder 06/02/2013  . Arthritis   . Squamous cell carcinoma   . Chicken pox   . Seasonal allergies   . Status post dilation of esophageal narrowing   . Peptic ulcer   . Hiatal hernia   . Liver failure     due to [place]col    Past Surgical History  Procedure Laterality Date  . Tonsillectomy and adenoidectomy    . Bunionectomy Bilateral      1 foot x 2,   . Nasal/sinus endoscopy      x 3  . Appendectomy  1964    Family History  Problem Relation Age of Onset  . Throat cancer Father   . Heart disease Father   . Thyroid cancer Sister   . Ovarian cancer Paternal Aunt   . Prostate cancer Brother   . Prostate cancer Maternal Uncle     x 2  .  Bladder Cancer Maternal Uncle   . Diabetes Daughter   . Dementia Mother     History   Social History  . Marital Status: Married    Spouse Name: N/A  . Number of Children: 3  . Years of Education: N/A   Occupational History  . home maker    Social History Main Topics  . Smoking status: Never Smoker   . Smokeless tobacco: Never Used  . Alcohol Use: 0.0 oz/week    0 Standard drinks or equivalent per week     Comment: one drink a day  . Drug Use: No  . Sexual Activity: Not on file   Other Topics Concern  . 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  busPIRone (BUSPAR) 30 MG tablet, Take 30 mg by mouth 2 (two) times daily., Disp: , Rfl:  .  Cholecalciferol (VITAMIN D3) 2000 UNITS TABS, Take 2,000 Units by mouth daily., Disp: , Rfl:  .  cyclobenzaprine (FLEXERIL) 10 MG tablet, Take 1 tablet (10 mg  total) by mouth 2 (two) times daily as needed for muscle spasms., Disp: 20 tablet, Rfl: 0 .  DETROL LA 4 MG 24 hr capsule, TAKE 1 CAPSULE DAILY, Disp: 90 capsule, Rfl: 1 .  diphenhydramine-acetaminophen (TYLENOL PM EXTRA STRENGTH) 25-500 MG TABS, Take 1 tablet by mouth at bedtime as needed., Disp: , Rfl:  .  esomeprazole (NEXIUM) 40 MG capsule, Take 1 capsule (40 mg total) by mouth daily at 12 noon., Disp: 90 capsule, Rfl: 3 .  fenofibrate 160 MG tablet, TAKE 1 TABLET DAILY, Disp: 90 tablet, Rfl: 1 .  FLUoxetine (PROZAC) 40 MG capsule, Take 2 tablets by mouth once daily, Disp: , Rfl:  .  HYDROcodone-acetaminophen (NORCO/VICODIN) 5-325 MG per tablet, Take 1-2 tablets by mouth every 6 (six) hours as needed., Disp: 10 tablet, Rfl: 0 .  magnesium gluconate (MAGONATE) 500 MG tablet, Take 500 mg by mouth daily., Disp: , Rfl:  .  nitrofurantoin,  macrocrystal-monohydrate, (MACROBID) 100 MG capsule, Take 1 capsule (100 mg total) by mouth 2 (two) times daily., Disp: 14 capsule, Rfl: 0 .  Potassium Gluconate 595 MG CAPS, Take 595 mg by mouth daily., Disp: , Rfl:  .  Probiotic Product (PROBIOTIC DAILY PO), Take by mouth., Disp: , Rfl:  .  valsartan-hydrochlorothiazide (DIOVAN-HCT) 80-12.5 MG per tablet, TAKE 1 TABLET DAILY, Disp: 90 tablet, Rfl: 1 .  zolpidem (AMBIEN) 10 MG tablet, Take 10 mg by mouth at bedtime as needed for sleep., Disp: , Rfl:   EXAM:  Filed Vitals:   10/30/14 0949  BP: 138/94  Pulse: 85  Temp: 97.8 F (36.6 C)    Body mass index is 28.82 kg/(m^2).  GENERAL: vitals reviewed and listed above, alert, oriented, appears well hydrated and in no acute distress  HEENT: atraumatic, conjunttiva clear, no obvious abnormalities on inspection of external nose and ears, normal appearance of ear canals and TMs, clear nasal congestion, mild post oropharyngeal erythema with PND, no tonsillar edema or exudate, no sinus TTP  NECK: no obvious masses on inspection  LUNGS: clear to auscultation bilaterally, no wheezes, rales or rhonchi, good air movement  CV: HRRR, no peripheral edema  MS: moves all extremities without noticeable abnormality  PSYCH: pleasant and cooperative, no obvious depression or anxiety  ASSESSMENT AND PLAN:  Discussed the following assessment and plan:  Essential hypertension -cont current tx, advised to take medications every day  Hyperlipidemia -she has declined further eval or treatment other then lifestyle changes  Mood disorder -managed by psych, stable  Seasonal allergies -start antihistamine, RTC if any symptoms persist in 2 weeks  Gastroesophageal reflux disease, esophagitis presence not specified  -Patient advised to return or notify a doctor immediately if symptoms worsen or persist or new concerns arise.  Patient Instructions  BEFORE YOU LEAVE: -Medicare annual wellness visit  in October  Try zyrtec of allegra for the allergy symptoms and follow up in 2 weeks if symptoms persist  Take blood pressure medications every day  We recommend the following healthy lifestyle measures: - eat a healthy diet consisting of lots of vegetables, fruits, beans, nuts, seeds, healthy meats such as white chicken and fish   - avoid fried foods, starches, sweets, fast food, processed foods, sodas, red meet and other fattening foods.  - get a least 150 minutes of aerobic exercise per week.       Colin Benton R.

## 2014-12-14 ENCOUNTER — Other Ambulatory Visit: Payer: Self-pay | Admitting: Family Medicine

## 2015-01-28 ENCOUNTER — Other Ambulatory Visit: Payer: Self-pay | Admitting: Family Medicine

## 2015-02-24 DIAGNOSIS — F331 Major depressive disorder, recurrent, moderate: Secondary | ICD-10-CM | POA: Diagnosis not present

## 2015-03-08 DIAGNOSIS — R3121 Asymptomatic microscopic hematuria: Secondary | ICD-10-CM | POA: Diagnosis not present

## 2015-03-09 DIAGNOSIS — L570 Actinic keratosis: Secondary | ICD-10-CM | POA: Diagnosis not present

## 2015-03-09 DIAGNOSIS — L814 Other melanin hyperpigmentation: Secondary | ICD-10-CM | POA: Diagnosis not present

## 2015-03-09 DIAGNOSIS — D225 Melanocytic nevi of trunk: Secondary | ICD-10-CM | POA: Diagnosis not present

## 2015-03-09 DIAGNOSIS — L821 Other seborrheic keratosis: Secondary | ICD-10-CM | POA: Diagnosis not present

## 2015-03-16 ENCOUNTER — Encounter: Payer: Self-pay | Admitting: Family Medicine

## 2015-03-16 ENCOUNTER — Ambulatory Visit (INDEPENDENT_AMBULATORY_CARE_PROVIDER_SITE_OTHER): Payer: Medicare Other | Admitting: Family Medicine

## 2015-03-16 VITALS — BP 128/88 | HR 85 | Temp 98.3°F | Ht 62.75 in | Wt 150.1 lb

## 2015-03-16 DIAGNOSIS — Z23 Encounter for immunization: Secondary | ICD-10-CM

## 2015-03-16 DIAGNOSIS — Z0000 Encounter for general adult medical examination without abnormal findings: Secondary | ICD-10-CM | POA: Diagnosis not present

## 2015-03-16 DIAGNOSIS — I10 Essential (primary) hypertension: Secondary | ICD-10-CM

## 2015-03-16 DIAGNOSIS — E785 Hyperlipidemia, unspecified: Secondary | ICD-10-CM | POA: Diagnosis not present

## 2015-03-16 DIAGNOSIS — Z1159 Encounter for screening for other viral diseases: Secondary | ICD-10-CM | POA: Diagnosis not present

## 2015-03-16 DIAGNOSIS — K219 Gastro-esophageal reflux disease without esophagitis: Secondary | ICD-10-CM | POA: Diagnosis not present

## 2015-03-16 DIAGNOSIS — F39 Unspecified mood [affective] disorder: Secondary | ICD-10-CM

## 2015-03-16 LAB — BASIC METABOLIC PANEL
BUN: 23 mg/dL (ref 6–23)
CHLORIDE: 101 meq/L (ref 96–112)
CO2: 27 meq/L (ref 19–32)
Calcium: 9.6 mg/dL (ref 8.4–10.5)
Creatinine, Ser: 0.84 mg/dL (ref 0.40–1.20)
GFR: 71.74 mL/min (ref >60.00)
Glucose, Bld: 97 mg/dL (ref 70–99)
Potassium: 4.5 mEq/L (ref 3.5–5.1)
SODIUM: 138 meq/L (ref 135–145)

## 2015-03-16 NOTE — Progress Notes (Signed)
Medicare Annual Preventive Care Visit  (initial annual wellness or annual wellness exam)  Concerns and/or follow up today:  HTN: -meds: valsartan-hctz  (misses doses a few days per week) -reports doing well - takes potassium and magnesium chronically with these meds -denies: CP, SOB, swelling, HA  HLD: -intolerant to statins in the past and has refused any medications for this or further eval - again refuses today -regular exercise - up to 60  minutes of walking daily; diet is poor  Allergies: -PND, watery eyes, sneezing, itchy eyes and mild upper chest tickles -does not take anything -denies: SOB, Wheezing, coughing, fevers  GERD/Dysphagia/constipation - IBS: -on PPI, seeing GI  -reports: stable -denies: abd pain, nausea, vomting  Depression/Anxiety: -reports stable - husband a stressor, but she reports she is doing ok currently, feels safe -seeing psych and counselor -she is on buspar, prozac and ambien -no SI or thoughts of self harm  ROS: negative for report of fevers, unintentional weight loss, vision changes, vision loss, hearing loss or change, chest pain, sob, hemoptysis, melena, hematochezia, hematuria, genital discharge or lesions, falls, bleeding or bruising, loc, thoughts of suicide or self harm, memory loss  1.) Patient-completed health risk assessment  - completed and reviewed, see scanned documentation  2.) Review of Medical History: -PMH, PSH, Family History and current specialty and care providers reviewed and updated and listed below  - see scanned in document in chart and below  Past Medical History  Diagnosis Date  . Hypertension   . Anxiety and depression     sees psychiatrist for this  . GERD (gastroesophageal reflux disease)     with hiatal hernia, esophageal stricture  . Hyperlipidemia     has refused treatment or further evaluation other then lifestyle changes  . Overactive bladder 06/02/2013  . Arthritis   . Squamous cell carcinoma ([place])   .  Chicken pox   . Seasonal allergies   . Status post dilation of esophageal narrowing   . Peptic ulcer   . Hiatal hernia   . Liver failure ([place])     due to Baycol    Past Surgical History  Procedure Laterality Date  . Tonsillectomy and adenoidectomy    . Bunionectomy Bilateral      1 foot x 2,   . Nasal/sinus endoscopy      x 3  . Appendectomy  1964    Social History   Social History  . Marital Status: Married    Spouse Name: N/A  . Number of Children: 3  . Years of Education: N/A   Occupational History  . home maker    Social History Main Topics  . Smoking status: Never Smoker   . Smokeless tobacco: Never Used  . Alcohol Use: 0.0 oz/week    0 Standard drinks or equivalent per week     Comment: one drink a day  . Drug Use: No  . Sexual Activity: Not on file   Other Topics Concern  . 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 YES - see scanned document  History of falling? YES - her dog knocked her over, no injury or LOC  Any trouble with IADLs - using a phone, using transportation, grocery shopping, preparing meals, doing housework, doing laundry, taking medications and managing money? NO  Advance Directives? YES  See summary of recommendations in Patient Instructions below.  4.) Physical Exam Filed Vitals:   03/16/15 0830  BP: 128/88  Pulse: 85  Temp: 98.3 F (36.8 C)   Estimated body mass index is 26.8 kg/(m^2) as calculated from the following:   Height as of this encounter: 5' 2.75" (1.594 m).   Weight as of this encounter: 150 lb 1.6 oz (68.085 kg).  EKG (optional): deferred  General: alert, appear well hydrated  and in no acute distress  HEENT: visual acuity grossly intact  NECK: no masses, no bruit  CV: HRRR, no peripheral edema  Lungs: CTA bilaterally  Psych: pleasant and cooperative, no obvious depression or anxiety  Cognitive function grossly intact  See patient instructions for recommendations.  Education and counseling regarding the above review of health provided with a plan for the following: -see scanned patient completed form for further details -fall prevention strategies discussed  -healthy lifestyle discussed -importance and resources for completing advanced directives discussed -see patient instructions below for any other recommendations provided  4)The following written screening schedule of preventive measures were reviewed with assessment and plan made per below, orders and patient instructions:       Alcohol screening done     Obesity Screening and counseling done     STI screening (Hep C if born [birth year]-65) offered, declined     Tobacco Screening done       Pneumococcal (PPSV23 -one dose after 64, one before if risk factors), influenza yearly and hepatitis B vaccines (if high risk - end stage renal disease, IV drugs, homosexual men, live in home for mentally retarded, hemophilia receiving factors) ASSESSMENT/PLAN: done      Screening mammograph (yearly if >40) ASSESSMENT/PLAN: done      Screening Pap smear/pelvic exam (q2 years) ASSESSMENT/PLAN: deferred       Prostate cancer screening ASSESSMENT/PLAN: n/a      Colorectal cancer screening (FOBT yearly or flex sig q4y or colonoscopy q10y or barium enema q4y) ASSESSMENT/PLAN: done      Diabetes outpatient self-management training services ASSESSMENT/PLAN: n/a      Bone mass measurements(covered q2y if indicated - estrogen def, osteoporosis, hyperparathyroid, vertebral abnormalities, osteoporosis or steroids) ASSESSMENT/PLAN:      Screening for glaucoma(q1y if high risk - diabetes, FH, AA and > 50 or  hispanic and > 65) ASSESSMENT/PLAN: done      Medical nutritional therapy for individuals with diabetes or renal disease ASSESSMENT/PLAN: n/a      Cardiovascular screening blood tests (lipids q5y) ASSESSMENT/PLAN: done      Diabetes screening tests ASSESSMENT/PLAN: done   7.) Summary:   Medicare annual wellness visit, subsequent -risk factors and conditions per above assessment were discussed and treatment, recommendations and referrals were offered per documentation above, scanned documentation and orders and patient instructions.  Essential hypertension - Plan: Basic metabolic panel -BP great today, continue current medications, congratulated on lifestyle changes  Hyperlipidemia -she has declined further eval or treatment, has been educated on risks of hyperlipidemia and treatment options -working on lifestyle and congratulated on regular exercise  Gastroesophageal reflux disease, esophagitis presence not specified -stable  Mood disorder ([place]) -stable, doing well today  Need for hepatitis C screening test - Plan: Hep C Antibody  Patient Instructions  BEFORE YOU LEAVE: -flu shot -lab -schedule follow up in 6 months  -We have ordered labs or studies at this visit. It can take up to 1-2 weeks for results and processing. We will contact you with instructions IF your results are abnormal. Normal results will be released to your [facility]. If you have not heard from [place] or can not find your results in [facility] in 2 weeks please contact our office.  We recommend the following healthy lifestyle measures: - eat a healthy whole foods diet consisting of regular small meals composed of vegetables, fruits, beans, nuts, seeds, healthy meats such as white chicken and fish and whole grains.  - avoid sweets, white starchy foods, fried foods, fast food, processed foods, sodas, red meet and other fattening foods.  - get a least 150-300 minutes of aerobic exercise per week.   Please bring a  copy of your advanced directives/Living will to the next appointment to scan into your chart

## 2015-03-16 NOTE — Progress Notes (Signed)
Pre visit review using our clinic review tool, if applicable. No additional management support is needed unless otherwise documented below in the visit note. 

## 2015-03-16 NOTE — Patient Instructions (Signed)
BEFORE YOU LEAVE: -flu shot -lab -schedule follow up in 6 months  -We have ordered labs or studies at this visit. It can take up to 1-2 weeks for results and processing. We will contact you with instructions IF your results are abnormal. Normal results will be released to your [facility]. If you have not heard from [place] or can not find your results in [facility] in 2 weeks please contact our office.  We recommend the following healthy lifestyle measures: - eat a healthy whole foods diet consisting of regular small meals composed of vegetables, fruits, beans, nuts, seeds, healthy meats such as white chicken and fish and whole grains.  - avoid sweets, white starchy foods, fried foods, fast food, processed foods, sodas, red meet and other fattening foods.  - get a least 150-300 minutes of aerobic exercise per week.   Please bring a copy of your advanced directives/Living will to the next appointment to scan into your chart

## 2015-03-17 LAB — HEPATITIS C ANTIBODY: HCV Ab: NEGATIVE

## 2015-04-26 ENCOUNTER — Other Ambulatory Visit: Payer: Self-pay | Admitting: Family Medicine

## 2015-05-03 ENCOUNTER — Encounter: Payer: Self-pay | Admitting: Family Medicine

## 2015-05-03 ENCOUNTER — Ambulatory Visit (INDEPENDENT_AMBULATORY_CARE_PROVIDER_SITE_OTHER): Payer: Medicare Other | Admitting: Family Medicine

## 2015-05-03 VITALS — BP 136/80 | HR 88 | Temp 97.9°F | Ht 62.75 in | Wt 149.7 lb

## 2015-05-03 DIAGNOSIS — J069 Acute upper respiratory infection, unspecified: Secondary | ICD-10-CM | POA: Diagnosis not present

## 2015-05-03 NOTE — Patient Instructions (Signed)

## 2015-05-03 NOTE — Progress Notes (Signed)
Pre visit review using our clinic review tool, if applicable. No additional management support is needed unless otherwise documented below in the visit note. 

## 2015-05-03 NOTE — Progress Notes (Signed)
HPI:  URI: -started: 5-7 days ago -symptoms:nasal congestion, PND, L ear pain, cough -denies:fever, SOB, NVD, tooth pain, sinus pain -has tried: nothin -sick contacts/travel/risks: denies flu exposure, tick exposure or or Ebola risks  ROS: See pertinent positives and negatives per HPI.  Past Medical History  Diagnosis Date  . Hypertension   . Anxiety and depression     sees psychiatrist for this  . GERD (gastroesophageal reflux disease)     with hiatal hernia, esophageal stricture  . Hyperlipidemia     has refused treatment or further evaluation other then lifestyle changes  . Overactive bladder 06/02/2013  . Arthritis   . Squamous cell carcinoma ([place])   . Chicken pox   . Seasonal allergies   . Status post dilation of esophageal narrowing   . Peptic ulcer   . Hiatal hernia   . Liver failure ([place])     due to Baycol    Past Surgical History  Procedure Laterality Date  . Tonsillectomy and adenoidectomy    . Bunionectomy Bilateral      1 foot x 2,   . Nasal/sinus endoscopy      x 3  . Appendectomy  1964    Family History  Problem Relation Age of Onset  . Throat cancer Father   . Heart disease Father   . Thyroid cancer Sister   . Ovarian cancer Paternal Aunt   . Prostate cancer Brother   . Prostate cancer Maternal Uncle     x 2  . Bladder Cancer Maternal Uncle   . Diabetes Daughter   . Dementia Mother     Social History   Social History  . Marital Status: Married    Spouse Name: N/A  . Number of Children: 3  . Years of Education: N/A   Occupational History  . home maker    Social History Main Topics  . Smoking status: Never Smoker   . Smokeless tobacco: Never Used  . Alcohol Use: 0.0 oz/week    0 Standard drinks or equivalent per week     Comment: one drink a day  . Drug Use: No  . Sexual Activity: Not Asked   Other Topics Concern  . 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  busPIRone (BUSPAR) 30 MG tablet, Take 30 mg by mouth 2 (two) times daily., Disp: , Rfl:  .  Cholecalciferol (VITAMIN D3) 2000 UNITS TABS, Take 2,000 Units by mouth daily., Disp: , Rfl:  .  cyclobenzaprine (FLEXERIL) 10 MG tablet, Take 1 tablet (10 mg total) by mouth 2 (two) times daily as needed for muscle spasms., Disp: 20 tablet, Rfl: 0 .  DETROL LA 4 MG 24 hr capsule, TAKE 1 CAPSULE DAILY, Disp: 90 capsule, Rfl: 1 .  diphenhydramine-acetaminophen (TYLENOL PM EXTRA STRENGTH) 25-500 MG TABS, Take 1 tablet by mouth at bedtime as needed., Disp: , Rfl:  .  fenofibrate 160 MG tablet, TAKE 1 TABLET DAILY, Disp: 90 tablet, Rfl: 1 .  FLUoxetine (PROZAC) 40 MG capsule, Take 2 tablets by mouth once daily, Disp: , Rfl:  .  HYDROcodone-acetaminophen (NORCO/VICODIN) 5-325 MG per tablet, Take 1-2 tablets by mouth  every 6 (six) hours as needed., Disp: 10 tablet, Rfl: 0 .  magnesium gluconate (MAGONATE) 500 MG tablet, Take 500 mg by mouth daily., Disp: , Rfl:  .  NEXIUM 40 MG capsule, TAKE 1 CAPSULE DAILY AT 12 NOON, Disp: 90 capsule, Rfl: 2 .  nitrofurantoin, macrocrystal-monohydrate, (MACROBID) 100 MG capsule, Take 1 capsule (100 mg total) by mouth 2 (two) times daily., Disp: 14 capsule, Rfl: 0 .  Potassium Gluconate 595 MG CAPS, Take 595 mg by mouth daily., Disp: , Rfl:  .  Probiotic Product (PROBIOTIC DAILY PO), Take by mouth., Disp: , Rfl:  .  valsartan-hydrochlorothiazide (DIOVAN-HCT) 80-12.5 MG tablet, TAKE 1 TABLET DAILY, Disp: 90 tablet, Rfl: 1 .  zolpidem (AMBIEN) 10 MG tablet, Take 10 mg by mouth at bedtime as needed for sleep., Disp: , Rfl:   EXAM:  Filed Vitals:   05/03/15 1250  BP: 136/80  Pulse: 88  Temp: 97.9 F (36.6 C)    Body mass index is 26.72 kg/(m^2).  GENERAL: vitals reviewed and listed  above, alert, oriented, appears well hydrated and in no acute distress  HEENT: atraumatic, conjunttiva clear, no obvious abnormalities on inspection of external nose and ears, normal appearance of ear canals and TMs, clear nasal congestion, mild post oropharyngeal erythema with PND, no tonsillar edema or exudate, no sinus TTP  NECK: no obvious masses on inspection  LUNGS: clear to auscultation bilaterally, no wheezes, rales or rhonchi, good air movement  CV: HRRR, no peripheral edema  MS: moves all extremities without noticeable abnormality  PSYCH: pleasant and cooperative, no obvious depression or anxiety  ASSESSMENT AND PLAN:  Discussed the following assessment and plan:  Acute upper respiratory infection  -given HPI and exam findings today, a serious infection or illness is unlikely. We discussed potential etiologies, with VURI being most likely, and advised supportive care and monitoring. We discussed treatment side effects, likely course, antibiotic misuse, transmission, and signs of developing a serious illness. -of course, we advised to return or notify a doctor immediately if symptoms worsen or persist or new concerns arise.    Patient Instructions  INSTRUCTIONS FOR UPPER RESPIRATORY INFECTION:  -plenty of rest and fluids  -nasal saline wash 2-3 times daily (use prepackaged nasal saline or bottled/distilled water if making your own)   -can use AFRIN nasal spray for drainage and nasal congestion - but do NOT use longer then 3-4 days  -can use tylenol (in no history of liver disease) or ibuprofen (if no history of kidney disease, bowel bleeding or significant heart disease) as directed for aches and sorethroat  -in the winter time, using a humidifier at night is helpful (please follow cleaning instructions)  -if you are taking a cough medication - use only as directed, may also try a teaspoon of honey to coat the throat and throat lozenges. If given a cough medication with  codeine or hydrocodone or other narcotic please be advised that this contains a strong and  potentially addicting medication. Please follow instructions carefully, take as little as possible and only use AS NEEDED for severe cough. Discuss potential side effects with your pharmacy. Please do not drive or operate machinery while taking these types of medications. Please do not take other sedating medications, drugs or alcohol while taking this medication without discussing with your doctor.  -for sore throat, salt water gargles can help  -follow up if you have fevers, facial pain, tooth pain, difficulty breathing or are worsening or symptoms persist longer then expected  Upper Respiratory  Infection, Adult An upper respiratory infection (URI) is also known as the common cold. It is often caused by a type of germ (virus). Colds are easily spread (contagious). You can pass it to others by kissing, coughing, sneezing, or drinking out of the same glass. Usually, you get better in 1 to 3  weeks.  However, the cough can last for even longer. HOME CARE   Only take medicine as told by your doctor. Follow instructions provided above.  Drink enough water and fluids to keep your pee (urine) clear or pale yellow.  Get plenty of rest.  Return to work when your temperature is < 100 for 24 hours or as told by your doctor. You may use a face mask and wash your hands to stop your cold from spreading. GET HELP RIGHT AWAY IF:   After the first few days, you feel you are getting worse.  You have questions about your medicine.  You have chills, shortness of breath, or red spit (mucus).  You have pain in the face for more then 1-2 days, especially when you bend forward.  You have a fever, puffy (swollen) neck, pain when you swallow, or white spots in the back of your throat.  You have a bad headache, ear pain, sinus pain, or chest pain.  You have a high-pitched whistling sound when you breathe in and out  (wheezing).  You cough up blood.  You have sore muscles or a stiff neck. MAKE SURE YOU:   Understand these instructions.  Will watch your condition.  Will get help right away if you are not doing well or get worse. Document Released: 10/25/2007 Document Revised: 07/31/2011 Document Reviewed: 08/13/2013 [facility] Patient Information 2015 [place], [place]. This information is not intended to replace advice given to you by your health care provider. Make sure you discuss any questions you have with your health care provider.      Colin Benton R.

## 2015-07-21 DIAGNOSIS — F331 Major depressive disorder, recurrent, moderate: Secondary | ICD-10-CM | POA: Diagnosis not present

## 2015-09-08 ENCOUNTER — Other Ambulatory Visit: Payer: Self-pay | Admitting: Family Medicine

## 2015-09-14 ENCOUNTER — Ambulatory Visit (INDEPENDENT_AMBULATORY_CARE_PROVIDER_SITE_OTHER): Payer: Medicare Other | Admitting: Family Medicine

## 2015-09-14 ENCOUNTER — Encounter: Payer: Self-pay | Admitting: Family Medicine

## 2015-09-14 VITALS — BP 128/88 | HR 95 | Temp 98.1°F | Ht 62.75 in | Wt 151.7 lb

## 2015-09-14 DIAGNOSIS — J302 Other seasonal allergic rhinitis: Secondary | ICD-10-CM | POA: Diagnosis not present

## 2015-09-14 DIAGNOSIS — K219 Gastro-esophageal reflux disease without esophagitis: Secondary | ICD-10-CM

## 2015-09-14 DIAGNOSIS — I10 Essential (primary) hypertension: Secondary | ICD-10-CM

## 2015-09-14 DIAGNOSIS — E785 Hyperlipidemia, unspecified: Secondary | ICD-10-CM | POA: Diagnosis not present

## 2015-09-14 DIAGNOSIS — F39 Unspecified mood [affective] disorder: Secondary | ICD-10-CM

## 2015-09-14 NOTE — Progress Notes (Signed)
HPI:  Follow up: HTN: -meds: valsartan-hctz (misses doses a few days per week), did take prior to appt -reports doing well - takes potassium and magnesium chronically with these meds -denies: CP, SOB, swelling, HA  HLD/Obesity: -intolerant to statins in the past and has refused any medications for this or further eval -diet/exercise: Walking daily 25-30 minutes; diet is not great  Allergies: -PND, watery eyes, sneezing, itchy eyes - especially this time of year -Uses Nasacort sometimes, Afrin courses sometimes, occasional Benadryl -denies: SOB, Wheezing, coughing, fevers  GERD/Dysphagia/constipation - IBS: -on PPI, seeing GI  -reports: stable, reports worse if uses low-dose PPI -denies: abd pain, nausea, vomting  Depression/Anxiety: -reports stable - husband a stressor, but she reports she is doing ok currently, feels safe -seeing psych and counselor -she is on buspar, prozac and ambien -no SI or thoughts of self harm  ROS: See pertinent positives and negatives per HPI.  Past Medical History  Diagnosis Date  . Hypertension   . Anxiety and depression     sees psychiatrist for this  . GERD (gastroesophageal reflux disease)     with hiatal hernia, esophageal stricture  . Hyperlipidemia     has refused treatment or further evaluation other then lifestyle changes  . Overactive bladder 06/02/2013  . Arthritis   . Squamous cell carcinoma ([place])   . Chicken pox   . Seasonal allergies   . Status post dilation of esophageal narrowing   . Peptic ulcer   . Hiatal hernia   . Liver failure ([place])     due to Baycol    Past Surgical History  Procedure Laterality Date  . Tonsillectomy and adenoidectomy    . Bunionectomy Bilateral      1 foot x 2,   . Nasal/sinus endoscopy      x 3  . Appendectomy  1964    Family History  Problem Relation Age of Onset  . Throat cancer Father   . Heart disease Father   . Thyroid cancer Sister   . Ovarian cancer Paternal Aunt   .  Prostate cancer Brother   . Prostate cancer Maternal Uncle     x 2  . Bladder Cancer Maternal Uncle   . Diabetes Daughter   . Dementia Mother     Social History   Social History  . Marital Status: Married    Spouse Name: N/A  . Number of Children: 3  . Years of Education: N/A   Occupational History  . home maker    Social History Main Topics  . Smoking status: Never Smoker   . Smokeless tobacco: Never Used  . Alcohol Use: 0.0 oz/week    0 Standard drinks or equivalent per week     Comment: one drink a day  . Drug Use: No  . Sexual Activity: Not Asked   Other Topics Concern  . 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  busPIRone (BUSPAR) 30 MG tablet, Take 30 mg by mouth 2 (two) times daily., Disp: , Rfl:  .  Cholecalciferol (VITAMIN D3) 2000 UNITS TABS, Take 2,000 Units by mouth daily., Disp: , Rfl:  .  DETROL LA  4 MG 24 hr capsule, TAKE 1 CAPSULE DAILY, Disp: 90 capsule, Rfl: 1 .  diphenhydramine-acetaminophen (TYLENOL PM EXTRA STRENGTH) 25-500 MG TABS, Take 1 tablet by mouth at bedtime as needed., Disp: , Rfl:  .  fenofibrate 160 MG tablet, TAKE 1 TABLET DAILY, Disp: 90 tablet, Rfl: 1 .  FLUoxetine (PROZAC) 40 MG capsule, 20 mg. Take 2 tablets by mouth once daily, Disp: , Rfl:  .  magnesium gluconate (MAGONATE) 500 MG tablet, Take 500 mg by mouth daily., Disp: , Rfl:  .  NEXIUM 40 MG capsule, TAKE 1 CAPSULE DAILY AT 12 NOON, Disp: 90 capsule, Rfl: 1 .  Potassium Gluconate 595 MG CAPS, Take 595 mg by mouth daily., Disp: , Rfl:  .  Probiotic Product (PROBIOTIC DAILY PO), Take by mouth., Disp: , Rfl:  .  valsartan-hydrochlorothiazide (DIOVAN-HCT) 80-12.5 MG tablet, TAKE 1 TABLET DAILY, Disp: 90 tablet, Rfl: 1 .  zolpidem  (AMBIEN) 10 MG tablet, Take 10 mg by mouth at bedtime as needed for sleep., Disp: , Rfl:   EXAM:  Filed Vitals:   09/14/15 0921  BP: 128/88  Pulse: 95  Temp: 98.1 F (36.7 C)    Body mass index is 27.08 kg/(m^2).  GENERAL: vitals reviewed and listed above, alert, oriented, appears well hydrated and in no acute distress  HEENT: atraumatic, conjunttiva clear, no obvious abnormalities on inspection of external nose and ears  NECK: no obvious masses on inspection  LUNGS: clear to auscultation bilaterally, no wheezes, rales or rhonchi, good air movement  CV: HRRR, no peripheral edema  MS: moves all extremities without noticeable abnormality  PSYCH: pleasant and cooperative, no obvious depression or anxiety  ASSESSMENT AND PLAN:  Discussed the following assessment and plan:  Essential hypertension  Hyperlipidemia  Gastroesophageal reflux disease, esophagitis presence not specified  Seasonal allergies  Mood disorder ([place])  -lifestyle recs -discussed tx options for cholesterol - she has declined medications or recheck -BP better on recheck -she declined labs, agrees to do BP labs at CPE in October -Patient advised to return or notify a doctor immediately if symptoms worsen or persist or new concerns arise.  Patient Instructions  Before you leave: -Schedule Medicare annual wellness visit in October  For the allergies: -Can try Nasacort daily and Claritin or Allegra if needed during allergy season -Do not use the Afrin for more than 3-4 days in a row  We recommend the following healthy lifestyle measures: - eat a healthy whole foods diet consisting of regular small meals composed of vegetables, fruits, beans, nuts, seeds, healthy meats such as white chicken and fish and whole grains.  - avoid sweets, white starchy foods, fried foods, fast food, processed foods, sodas, red meet and other fattening foods.  - get a least 150-300 minutes of aerobic exercise per week.        Colin Benton R.

## 2015-09-14 NOTE — Patient Instructions (Addendum)
Before you leave: -Schedule Medicare annual wellness visit in October  For the allergies: -Can try Nasacort daily and Claritin or Allegra if needed during allergy season -Do not use the Afrin for more than 3-4 days in a row  We recommend the following healthy lifestyle measures: - eat a healthy whole foods diet consisting of regular small meals composed of vegetables, fruits, beans, nuts, seeds, healthy meats such as white chicken and fish and whole grains.  - avoid sweets, white starchy foods, fried foods, fast food, processed foods, sodas, red meet and other fattening foods.  - get a least 150-300 minutes of aerobic exercise per week.

## 2015-09-14 NOTE — Progress Notes (Signed)
Pre visit review using our clinic review tool, if applicable. No additional management support is needed unless otherwise documented below in the visit note. 

## 2015-10-12 ENCOUNTER — Ambulatory Visit (INDEPENDENT_AMBULATORY_CARE_PROVIDER_SITE_OTHER): Payer: Medicare Other | Admitting: Family Medicine

## 2015-10-12 ENCOUNTER — Encounter: Payer: Self-pay | Admitting: Family Medicine

## 2015-10-12 VITALS — BP 120/90 | HR 88 | Temp 98.3°F | Ht 62.75 in | Wt 154.7 lb

## 2015-10-12 DIAGNOSIS — K2100 Gastro-esophageal reflux disease with esophagitis, without bleeding: Secondary | ICD-10-CM

## 2015-10-12 MED ORDER — PANTOPRAZOLE SODIUM 40 MG PO TBEC: 40.0000 mg | DELAYED_RELEASE_TABLET | Freq: Every day | ORAL | Status: DC

## 2015-10-12 NOTE — Progress Notes (Signed)
Pre visit review using our clinic review tool, if applicable. No additional management support is needed unless otherwise documented below in the visit note. 

## 2015-10-12 NOTE — Patient Instructions (Signed)
Keep medicare exam as scheduled.  Stop the nexium.  Try the protonix (pantoprazole) instead. Please let us know if you need to send 90 days to mail pharmacy.  Follow up as needed.

## 2015-10-12 NOTE — Progress Notes (Signed)
HPI:  Acute visit for:  GERD/Dysphagia/constipation - IBS: -on PPI,  Was seeing GI, hx esophageal stricture and hiatal hernia -normal EGD 07/2014 with Dr. Fuller Plan -reports: stable, reports worse if uses low-dose PPI -however, insurance sent her a letter stating they will no longer cover nexium; meds they will cover include omeprazole, pantoprazole, rabprozole -denies: abd pain, nausea, vomting  ROS: See pertinent positives and negatives per HPI.  Past Medical History  Diagnosis Date  . Hypertension   . Anxiety and depression     sees psychiatrist for this  . GERD (gastroesophageal reflux disease)     with hiatal hernia, esophageal stricture  . Hyperlipidemia     has refused treatment or further evaluation other then lifestyle changes  . Overactive bladder 06/02/2013  . Arthritis   . Squamous cell carcinoma ([place])   . Chicken pox   . Seasonal allergies   . Status post dilation of esophageal narrowing   . Peptic ulcer   . Hiatal hernia   . Liver failure ([place])     due to Baycol    Past Surgical History  Procedure Laterality Date  . Tonsillectomy and adenoidectomy    . Bunionectomy Bilateral      1 foot x 2,   . Nasal/sinus endoscopy      x 3  . Appendectomy  1964    Family History  Problem Relation Age of Onset  . Throat cancer Father   . Heart disease Father   . Thyroid cancer Sister   . Ovarian cancer Paternal Aunt   . Prostate cancer Brother   . Prostate cancer Maternal Uncle     x 2  . Bladder Cancer Maternal Uncle   . Diabetes Daughter   . Dementia Mother     Social History   Social History  . Marital Status: Married    Spouse Name: N/A  . Number of Children: 3  . Years of Education: N/A   Occupational History  . home maker    Social History Main Topics  . Smoking status: Never Smoker   . Smokeless tobacco: Never Used  . Alcohol Use: 0.0 oz/week    0 Standard drinks or equivalent per week     Comment: one drink a day  . Drug Use: No  . Sexual  Activity: Not Asked   Other Topics Concern  . 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  busPIRone (BUSPAR) 30 MG tablet, Take 30 mg by mouth 2 (two) times daily., Disp: , Rfl:  .  Cholecalciferol (VITAMIN D3) 2000 UNITS TABS, Take 2,000 Units by mouth daily., Disp: , Rfl:  .  DETROL LA 4 MG 24 hr capsule, TAKE 1 CAPSULE DAILY, Disp: 90 capsule, Rfl: 1 .  diphenhydramine-acetaminophen (TYLENOL PM EXTRA STRENGTH) 25-500 MG TABS, Take 1 tablet by mouth at bedtime as needed., Disp: , Rfl:  .  fenofibrate 160 MG tablet, TAKE 1 TABLET DAILY, Disp: 90 tablet, Rfl: 1 .  FLUoxetine (PROZAC) 40 MG capsule, 20 mg. Take 2 tablets by mouth once daily, Disp: , Rfl:  .  magnesium gluconate (MAGONATE) 500 MG tablet, Take 500 mg by mouth daily., Disp: ,  Rfl:  .  pantoprazole (PROTONIX) 40 MG tablet, Take 1 tablet (40 mg total) by mouth daily., Disp: 30 tablet, Rfl: 3 .  Potassium Gluconate 595 MG CAPS, Take 595 mg by mouth daily., Disp: , Rfl:  .  Probiotic Product (PROBIOTIC DAILY PO), Take by mouth., Disp: , Rfl:  .  valsartan-hydrochlorothiazide (DIOVAN-HCT) 80-12.5 MG tablet, TAKE 1 TABLET DAILY, Disp: 90 tablet, Rfl: 1 .  zolpidem (AMBIEN) 10 MG tablet, Take 10 mg by mouth at bedtime as needed for sleep., Disp: , Rfl:   EXAM:  Filed Vitals:   10/12/15 1251  BP: 120/90  Pulse: 88  Temp: 98.3 F (36.8 C)    Body mass index is 27.62 kg/(m^2).  GENERAL: vitals reviewed and listed above, alert, oriented, appears well hydrated and in no acute distress  HEENT: atraumatic, conjunttiva clear, no obvious abnormalities on inspection of external nose and ears  NECK: no obvious masses on inspection  LUNGS: clear to auscultation bilaterally, no wheezes,  rales or rhonchi, good air movement  CV: HRRR, no peripheral edema  MS: moves all extremities without noticeable abnormality  PSYCH: pleasant and cooperative, no obvious depression or anxiety  ASSESSMENT AND PLAN:  Discussed the following assessment and plan:  Gastroesophageal reflux disease with esophagitis  -try change to covered medication -also advised once has switched successfully to try lower dose 20mg  daily -follow up as needed and for physical -Patient advised to return or notify a doctor immediately if symptoms worsen or persist or new concerns arise.  Patient Instructions  Keep medicare exam as scheduled.  Stop the nexium.  Try the protonix (pantoprazole) instead. Please let us know if you need to send 90 days to mail pharmacy.  Follow up as needed.     Colin Benton R.

## 2015-10-24 ENCOUNTER — Other Ambulatory Visit: Payer: Self-pay | Admitting: Family Medicine

## 2015-12-15 DIAGNOSIS — F331 Major depressive disorder, recurrent, moderate: Secondary | ICD-10-CM | POA: Diagnosis not present

## 2015-12-16 DIAGNOSIS — Z961 Presence of intraocular lens: Secondary | ICD-10-CM | POA: Diagnosis not present

## 2016-01-11 ENCOUNTER — Telehealth: Payer: Self-pay | Admitting: Family Medicine

## 2016-01-11 NOTE — Telephone Encounter (Signed)
I left a message for the pt to return my call. 

## 2016-01-11 NOTE — Telephone Encounter (Signed)
Please notify pt nexium not covered and see which of the alternatives she would like to try. If she would rather discuss with her gastroenterologist that is another option.Thanks.

## 2016-01-11 NOTE — Telephone Encounter (Signed)
Received a fax from [facility].  [employer] will not cover Nexium unless rabeprazole, omeprazole and patoprazole all have been tried and failed.  If no reason that the pt must stay on Nexium than please prescribe an alternative medication.

## 2016-01-12 NOTE — Telephone Encounter (Signed)
Pt is aware of annotations. She is going to use OTC nexium due to the pantoprazole not working. Medication list is UTD.

## 2016-02-17 ENCOUNTER — Telehealth: Payer: Self-pay | Admitting: Family Medicine

## 2016-02-17 NOTE — Telephone Encounter (Signed)
[facility] Patient Name: Rashidah Al DOB: [birth year] Initial Comment Caller's BP has been high. She does not know the reading. She is slightly dizzy and sweaty. Nurse Assessment Nurse: Germain Osgood, RN, Opal Sidles Date/Time Eilene Ghazi Time): 02/17/2016 4:43:10 PM Confirm and document reason for call. If symptomatic, describe symptoms. You must click the next button to save text entered. ---Caller's BP has been high. She does not know the reading. She is slightly dizzy and sweaty. Has back pain in middle of back Feels Lousy. Gets a little breathless. Has the patient traveled out of the country within the last 30 days? ---No Does the patient have any new or worsening symptoms? ---Yes Will a triage be completed? ---Yes Related visit to physician within the last 2 weeks? ---No Does the PT have any chronic conditions? (i.e. diabetes, asthma, etc.) ---Yes List chronic conditions. ---High Blood pressure Depression Anxiety insomnia Arthritis Osteoporosis, Is this a behavioral health or substance abuse call? ---No Guidelines Guideline Title Affirmed Question Affirmed Notes Headache [1] MODERATE headache (e.g., interferes with normal activities) AND [2] present > 24 hours AND [3] unexplained (Exceptions: analgesics not tried, typical migraine, or headache part of viral illness) Final Disposition User See Physician within [place], RN, Opal Sidles Disagree/Comply: Leta Baptist

## 2016-02-18 ENCOUNTER — Ambulatory Visit (INDEPENDENT_AMBULATORY_CARE_PROVIDER_SITE_OTHER): Payer: Medicare Other | Admitting: Family Medicine

## 2016-02-18 ENCOUNTER — Encounter: Payer: Self-pay | Admitting: Family Medicine

## 2016-02-18 VITALS — BP 122/82 | HR 85 | Temp 98.0°F | Ht 62.75 in | Wt 156.5 lb

## 2016-02-18 DIAGNOSIS — F339 Major depressive disorder, recurrent, unspecified: Secondary | ICD-10-CM | POA: Diagnosis not present

## 2016-02-18 DIAGNOSIS — H6983 Other specified disorders of Eustachian tube, bilateral: Secondary | ICD-10-CM | POA: Diagnosis not present

## 2016-02-18 DIAGNOSIS — J0110 Acute frontal sinusitis, unspecified: Secondary | ICD-10-CM | POA: Diagnosis not present

## 2016-02-18 DIAGNOSIS — I10 Essential (primary) hypertension: Secondary | ICD-10-CM

## 2016-02-18 MED ORDER — AMOXICILLIN-POT CLAVULANATE 875-125 MG PO TABS: 1.0000 | ORAL_TABLET | Freq: Two times a day (BID) | ORAL | 0 refills | Status: DC

## 2016-02-18 NOTE — Progress Notes (Signed)
HPI:  Crystal Patrick  subjective pleasant 70 year old with a past medical history significant for hypertension and anxiety and depression here for an acute visit for several issues.  Elevated blood pressure: -For the last several weeks when she checks her blood pressure at [facility] it seems to be running high, 150s over 100s -She is taking her blood pressure medications -She has been under a lot stress, she cares for a mother with dementia, he has been and is an alcoholic, a daughter struggling with her job and 2 grandsons with behavior issues -She sees a Teacher, music and is on several medications. No counseling currently. -No chest pain, shortness of breath, suicidal ideation, severe headaches  Sinus congestion: -Feels that this is her allergies -Yellow and green nasal congestion the last few days, sneezing, drainage in throat, frontal sinus pain intermittently, occasional dizziness, feels hot at times -No known fevers, shortness of breath, persistent sinus pain, wheezing -Not taking anything currently, reports always gets bad allergies this time of the year   ROS: See pertinent positives and negatives per HPI.  Past Medical History:  Diagnosis Date  . Anxiety and depression    sees psychiatrist for this  . Arthritis   . Chicken pox   . GERD (gastroesophageal reflux disease)    with hiatal hernia, esophageal stricture  . Hiatal hernia   . Hyperlipidemia    has refused treatment or further evaluation other then lifestyle changes  . Hypertension   . Liver failure ([place])    due to Baycol  . Overactive bladder 06/02/2013  . Peptic ulcer   . Seasonal allergies   . Squamous cell carcinoma ([place])   . Status post dilation of esophageal narrowing     Past Surgical History:  Procedure Laterality Date  . APPENDECTOMY  1964  . BUNIONECTOMY Bilateral     1 foot x 2,   . NASAL/SINUS ENDOSCOPY     x 3  . TONSILLECTOMY AND ADENOIDECTOMY      Family History  Problem Relation Age of  Onset  . Throat cancer Father   . Heart disease Father   . Thyroid cancer Sister   . Ovarian cancer Paternal Aunt   . Prostate cancer Brother   . Prostate cancer Maternal Uncle     x 2  . Bladder Cancer Maternal Uncle   . Diabetes Daughter   . Dementia Mother     Social History   Social History  . Marital status: Married    Spouse name: N/A  . Number of children: 3  . Years of education: N/A   Occupational History  . home maker    Social History Main Topics  . Smoking status: Never Smoker  . Smokeless tobacco: Never Used  . Alcohol use 0.0 oz/week     Comment: one drink a day  . Drug use: No  . Sexual activity: Not Asked   Other Topics Concern  . 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  busPIRone (BUSPAR) 30 MG tablet, Take 30 mg by mouth 2 (two) times daily., Disp: , Rfl:  .  Cholecalciferol (VITAMIN D3) 2000 UNITS TABS, Take 2,000 Units by mouth  daily., Disp: , Rfl:  .  DETROL LA 4 MG 24 hr capsule, TAKE 1 CAPSULE DAILY, Disp: 90 capsule, Rfl: 1 .  Esomeprazole Magnesium (NEXIUM PO), Take 40 mg by mouth daily. , Disp: , Rfl:  .  fenofibrate 160 MG tablet, TAKE 1 TABLET DAILY, Disp: 90 tablet, Rfl: 1 .  FLUoxetine (PROZAC) 40 MG capsule, 20 mg. Take 2 tablets by mouth once daily, Disp: , Rfl:  .  magnesium gluconate (MAGONATE) 500 MG tablet, Take 500 mg by mouth daily., Disp: , Rfl:  .  Potassium Gluconate 595 MG CAPS, Take 595 mg by mouth daily., Disp: , Rfl:  .  valsartan-hydrochlorothiazide (DIOVAN-HCT) 80-12.5 MG tablet, TAKE 1 TABLET DAILY, Disp: 90 tablet, Rfl: 1 .  zolpidem (AMBIEN) 10 MG tablet, Take 10 mg by mouth at bedtime as needed for sleep., Disp: , Rfl:  .  amoxicillin-clavulanate (AUGMENTIN) 875-125 MG  tablet, Take 1 tablet by mouth 2 (two) times daily., Disp: 20 tablet, Rfl: 0  EXAM:  Vitals:   02/18/16 0857  BP: 122/82  Pulse: 85  Temp: 98 F (36.7 C)    Body mass index is 27.94 kg/m.  GENERAL: vitals reviewed and listed above, alert, oriented, appears well hydrated and in no acute distress  HEENT: atraumatic, conjunttiva clear, no obvious abnormalities on inspection of external nose and ears, normal appearance of ear canals and TMs with bilateral clear effusions, clear nasal congestion, mild post oropharyngeal erythema with PND, no tonsillar edema or exudate, no sinus TTP  NECK: no obvious masses on inspection, no bruits  LUNGS: clear to auscultation bilaterally, no wheezes, rales or rhonchi, good air movement  CV: HRRR, no peripheral edema  MS: moves all extremities without noticeable abnormality  PSYCH/NEURO: pleasant and cooperative, no obvious depression or anxiety, speech and thought processing grossly intact, cranial nerves II through XII grossly intact, finger to nose normal  ASSESSMENT AND PLAN:  Discussed the following assessment and plan:  Acute frontal sinusitis, recurrence not specified ETD (eustachian tube dysfunction), bilateral -Treat allergies, though symptoms suggest onset of a viral infection versus a bacterial sinusitis  -Rx for Augmentin if symptoms do not improve and has persistent thick nasal congestion and frontal sinus discomfort  -Patient is to return if worsening or not improving with treatment   Essential hypertension -Blood pressure okay today  -Advised home blood pressure cuff and monitoring several days per week to keep a log  - She has a follow-up in a few weeks and we will plan to review them  -Follow up sooner as needed   Recurrent major depressive disorder, remission status unspecified ([place]) -Treated by psychiatry  -Advised consideration of adding CBT and she agrees, denies any severe symptoms at this time, but has a lot on her  plate   Plan to get labs at her physical in a few weeks.   -Patient advised to return or notify a doctor immediately if symptoms worsen or persist or new concerns arise.  Patient Instructions  BEFORE YOU LEAVE: -follow up: as scheduled and will plan to do labs then; follow up sooner if worsening or not improving  For the sinus issues: -afrin nasal spray twice daily for 3 days and Flonase 2 sprays each nostril daily for 3 weeks (these are available over the counter) -if sinus congestion, dizziness, frontal headache not resolving take the antibiotic as instructed   Consider adding Cognitive Behavioral Therapy for the stress.  Check the blood pressure at home 3 days per week. Average of 3  readings 1 minute apart. Bring log to appointment.    Colin Benton R., DO

## 2016-02-18 NOTE — Patient Instructions (Signed)
BEFORE YOU LEAVE: -follow up: as scheduled and will plan to do labs then; follow up sooner if worsening or not improving  For the sinus issues: -afrin nasal spray twice daily for 3 days and Flonase 2 sprays each nostril daily for 3 weeks (these are available over the counter) -if sinus congestion, dizziness, frontal headache not resolving take the antibiotic as instructed   Consider adding Cognitive Behavioral Therapy for the stress.  Check the blood pressure at home 3 days per week. Average of 3 readings 1 minute apart. Bring log to appointment.

## 2016-02-18 NOTE — Progress Notes (Signed)
Pre visit review using our clinic review tool, if applicable. No additional management support is needed unless otherwise documented below in the visit note. 

## 2016-03-07 DIAGNOSIS — L299 Pruritus, unspecified: Secondary | ICD-10-CM | POA: Diagnosis not present

## 2016-03-07 DIAGNOSIS — D1801 Hemangioma of skin and subcutaneous tissue: Secondary | ICD-10-CM | POA: Diagnosis not present

## 2016-03-07 DIAGNOSIS — L820 Inflamed seborrheic keratosis: Secondary | ICD-10-CM | POA: Diagnosis not present

## 2016-03-07 DIAGNOSIS — L821 Other seborrheic keratosis: Secondary | ICD-10-CM | POA: Diagnosis not present

## 2016-03-07 DIAGNOSIS — L814 Other melanin hyperpigmentation: Secondary | ICD-10-CM | POA: Diagnosis not present

## 2016-03-07 DIAGNOSIS — L570 Actinic keratosis: Secondary | ICD-10-CM | POA: Diagnosis not present

## 2016-03-07 DIAGNOSIS — D235 Other benign neoplasm of skin of trunk: Secondary | ICD-10-CM | POA: Diagnosis not present

## 2016-03-17 ENCOUNTER — Encounter: Payer: Self-pay | Admitting: Family Medicine

## 2016-03-17 ENCOUNTER — Ambulatory Visit (INDEPENDENT_AMBULATORY_CARE_PROVIDER_SITE_OTHER): Payer: Medicare Other | Admitting: Family Medicine

## 2016-03-17 VITALS — BP 110/82 | HR 86 | Temp 98.1°F | Ht 62.0 in | Wt 156.5 lb

## 2016-03-17 DIAGNOSIS — K219 Gastro-esophageal reflux disease without esophagitis: Secondary | ICD-10-CM | POA: Diagnosis not present

## 2016-03-17 DIAGNOSIS — E785 Hyperlipidemia, unspecified: Secondary | ICD-10-CM

## 2016-03-17 DIAGNOSIS — F329 Major depressive disorder, single episode, unspecified: Secondary | ICD-10-CM | POA: Insufficient documentation

## 2016-03-17 DIAGNOSIS — Z0000 Encounter for general adult medical examination without abnormal findings: Secondary | ICD-10-CM

## 2016-03-17 DIAGNOSIS — Z23 Encounter for immunization: Secondary | ICD-10-CM | POA: Diagnosis not present

## 2016-03-17 DIAGNOSIS — Z6827 Body mass index (BMI) 27.0-27.9, adult: Secondary | ICD-10-CM

## 2016-03-17 DIAGNOSIS — F3341 Major depressive disorder, recurrent, in partial remission: Secondary | ICD-10-CM | POA: Diagnosis not present

## 2016-03-17 DIAGNOSIS — Z8739 Personal history of other diseases of the musculoskeletal system and connective tissue: Secondary | ICD-10-CM

## 2016-03-17 DIAGNOSIS — I10 Essential (primary) hypertension: Secondary | ICD-10-CM

## 2016-03-17 DIAGNOSIS — Z6829 Body mass index (BMI) 29.0-29.9, adult: Secondary | ICD-10-CM | POA: Insufficient documentation

## 2016-03-17 HISTORY — DX: Personal history of other diseases of the musculoskeletal system and connective tissue: Z87.39

## 2016-03-17 LAB — BASIC METABOLIC PANEL
BUN: 21 mg/dL (ref 6–23)
CHLORIDE: 102 meq/L (ref 96–112)
CO2: 28 meq/L (ref 19–32)
CREATININE: 0.96 mg/dL (ref 0.40–1.20)
Calcium: 9.8 mg/dL (ref 8.4–10.5)
GFR: 61.31 mL/min (ref >60.00)
GLUCOSE: 111 mg/dL — AB (ref 70–99)
Potassium: 4.2 mEq/L (ref 3.5–5.1)
Sodium: 138 mEq/L (ref 135–145)

## 2016-03-17 LAB — CBC
HEMATOCRIT: 43.3 % (ref 36.0–46.0)
Hemoglobin: 14.6 g/dL (ref 12.0–15.0)
MCHC: 33.8 g/dL (ref 30.0–36.0)
MCV: 90.8 fl (ref 78.0–100.0)
PLATELETS: 372 10*3/uL (ref 150.0–400.0)
RBC: 4.77 Mil/uL (ref 3.87–5.11)
RDW: 13.4 % (ref 11.5–15.5)
WBC: 6.2 10*3/uL (ref 4.0–10.5)

## 2016-03-17 NOTE — Patient Instructions (Addendum)
BEFORE YOU LEAVE: -flu shot -labs -follow up: 4-6 months  Please call today to set up your mammogram.  We have ordered labs or studies at this visit. It can take up to 1-2 weeks for results and processing. IF results require follow up or explanation, we will call you with instructions. Clinically stable results will be released to your [facility]. If you have not heard from [place] or cannot find your results in [facility] in 2 weeks please contact our office at [phone].  If you are not yet signed up for [facility], please consider signing up.   We recommend the following healthy lifestyle for LIFE: 1) Small portions.   Tip: eat off of a salad plate instead of a dinner plate.  Tip: It is ok to feel hungry after a meal  Tip: if you need more or a snack choose fruits, veggies and/or a handful of nuts or seeds.  2) Eat a healthy clean diet.  * Tip: Avoid (less then 1 serving per week): processed foods, sweets, sweetened drinks, white starches (rice, flour, bread, potatoes, pasta, etc), red meat, fast foods, butter  *Tip: CHOOSE instead   * 5-9 servings per day of fresh or frozen fruits and vegetables (but not corn, potatoes, bananas, canned or dried fruit)   *nuts and seeds, beans   *olives and olive oil   *small portions of lean meats such as fish and white chicken    *small portions of whole grains  3)Get at least 150 minutes of sweaty aerobic exercise per week.  4)Reduce stress - consider counseling, meditation and relaxation to balance other aspects of your life.

## 2016-03-17 NOTE — Progress Notes (Signed)
Medicare Annual Preventive Care Visit  (initial annual wellness or annual wellness exam)  Concerns and/or follow up today:  Recent Rhinosinusitis: -resolved after abx  HLD/Overweight: -meds: fenofibrate, reported allergy to statins -she declined further testing - she does not want to do any further additional treatment for this  HTN: -meds: valsartan-hctz (80-12.5) -she had report elevated drug store BPs last month -today reports has monitored at home with review of this log showing average BP < 140/70s -under a lot of stress with caring for mother -lots of stress - took care of twin grandchildren (23yo)  GERD/Dysphagia/constipation - IBS: -on PPI,  was seeing GI, on nexium (insurance won't cover but she prefers to buy OTC) -hx esophageal stricture and hiatal hernia -normal EGD 07/2014 with Dr. Fuller Plan -reports: stable, reports worse if uses low-dose PPI -denies: abd pain, nausea, vomting  Depression/Anxiety/Insomnia: -managed by psychiatrist - [name] -meds: buspar, prozac, ambien -reports stable, no SI   Osteoporosis: -did not tolerate bisphosphonate's -did injectable for 2 years -last DEXA remotely -she does not want to re-asses as does not want to take any further medications for this  ROS: negative for report of fevers, unintentional weight loss, vision changes, vision loss, hearing loss or change, chest pain, sob, hemoptysis, melena, hematochezia, hematuria, genital discharge or lesions, falls, bleeding or bruising, loc, thoughts of suicide or self harm, memory loss  1.) Patient-completed health risk assessment  - completed and reviewed, see scanned documentation  2.) Review of Medical History: -PMH, PSH, Family History and current specialty and care providers reviewed and updated and listed below  - see scanned in document in chart and below  Past Medical History:  Diagnosis Date  . Anxiety and depression    sees psychiatrist for this  . Arthritis   .  Chicken pox   . GERD (gastroesophageal reflux disease)    with hiatal hernia, esophageal stricture  . Hiatal hernia   . Hx of osteoporosis 03/17/2016   -did not tol bisphosphonates -did injections -does not wish to re-assess or repeat DEXA -doing walking and Vit D  . Hyperlipidemia    has refused treatment or further evaluation other then lifestyle changes  . Hypertension   . Liver failure ([place])    due to [place]  . Overactive bladder 06/02/2013  . Peptic ulcer   . Seasonal allergies   . Squamous cell carcinoma   . Status post dilation of esophageal narrowing     Past Surgical History:  Procedure Laterality Date  . APPENDECTOMY  1964  . BUNIONECTOMY Bilateral     1 foot x 2,   . NASAL/SINUS ENDOSCOPY     x 3  . TONSILLECTOMY AND ADENOIDECTOMY      Social History   Social History  . Marital status: Married    Spouse name: N/A  . Number of children: 3  . Years of education: N/A   Occupational History  . home maker    Social History Main Topics  . Smoking status: Never Smoker  . Smokeless tobacco: Never Used  . Alcohol use 0.0 oz/week     Comment: one drink a day  . Drug use: No  . Sexual activity: Not on file   Other Topics Concern  . 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, daughter and 2 children      Spiritual Beliefs: Christian, Catholic      Lifestyle: no  regular exercise; diet is good              Family History  Problem Relation Age of Onset  . Throat cancer Father   . Heart disease Father   . Thyroid cancer Sister   . Ovarian cancer Paternal Aunt   . Prostate cancer Brother   . Prostate cancer Maternal Uncle     x 2  . Bladder Cancer Maternal Uncle   . Diabetes Daughter   . Dementia Mother     Current Outpatient Prescriptions on File Prior to Visit  Medication Sig Dispense Refill  . busPIRone (BUSPAR) 30 MG tablet Take 30 mg by mouth 2 (two) times daily.     . Cholecalciferol (VITAMIN D3) 2000 UNITS TABS Take 2,000 Units by mouth daily.    Marland Kitchen DETROL LA 4 MG 24 hr capsule TAKE 1 CAPSULE DAILY 90 capsule 1  . Esomeprazole Magnesium (NEXIUM PO) Take 40 mg by mouth daily.     . fenofibrate 160 MG tablet TAKE 1 TABLET DAILY 90 tablet 1  . FLUoxetine (PROZAC) 40 MG capsule 20 mg. Take 2 tablets by mouth once daily    . magnesium gluconate (MAGONATE) 500 MG tablet Take 500 mg by mouth daily.    . Potassium Gluconate 595 MG CAPS Take 595 mg by mouth daily.    . valsartan-hydrochlorothiazide (DIOVAN-HCT) 80-12.5 MG tablet TAKE 1 TABLET DAILY 90 tablet 1  . zolpidem (AMBIEN) 10 MG tablet Take 10 mg by mouth at bedtime as needed for sleep.     No current facility-administered medications on file prior to visit.      3.) Review of functional ability and level of safety:  Any difficulty hearing?  yes, see scanned documentation - does not want assessment - mild  History of falling? Yes, slipped on ice - no balance issues  Any trouble with IADLs - using a phone, using transportation, grocery shopping, preparing meals, doing housework, doing laundry, taking medications and managing money? No  Advance Directives? Yes; has hcpoa and living will, no changes desired  See summary of recommendations in Patient Instructions below.  4.) Physical Exam Vitals:   03/17/16 0758  BP: 110/82  Pulse: 86  Temp: 98.1 F (36.7 C)   Estimated body mass index is 28.62 kg/m as calculated from the following:   Height as of this encounter: 5\' 2"  (1.575 m).   Weight as of this encounter: 156 lb 8 oz (71 kg).  EKG (optional): deferred  General: alert, appear well hydrated and in no acute distress  HEENT: visual acuity grossly intact  CV: HRRR  Lungs: CTA bilaterally  Psych: pleasant and cooperative, no obvious depression or anxiety  Cognitive function grossly intact  See patient instructions for recommendations.  Education and counseling regarding the  above review of health provided with a plan for the following: -see scanned patient completed form for further details -fall prevention strategies discussed  -healthy lifestyle discussed -importance and resources for completing advanced directives discussed -see patient instructions below for any other recommendations provided  4)The following written screening schedule of preventive measures were reviewed with assessment and plan made per below, orders and patient instructions:      Alcohol screening done     Obesity Screening and counseling done     STI screening (Hep C if born 1943-65) offered and per pt wishes     Tobacco Screening done        Pneumococcal (PPSV23 -one dose after 64, one before if  risk factors), influenza yearly and hepatitis B vaccines (if high risk - end stage renal disease, IV drugs, homosexual men, live in home for mentally retarded, hemophilia receiving factors) ASSESSMENT/PLAN: done       Screening mammograph (yearly if >40) ASSESSMENT/PLAN: scheduled mammogram      Screening Pap smear/pelvic exam (q2 years) ASSESSMENT/PLAN: n/a, declined      Colorectal cancer screening (FOBT yearly or flex sig q4y or colonoscopy q10y or barium enema q4y) ASSESSMENT/PLAN: had colonoscopy 07/2014      Diabetes outpatient self-management training services ASSESSMENT/PLAN: utd or done      Bone mass measurements(covered q2y if indicated - estrogen def, osteoporosis, hyperparathyroid, vertebral abnormalities, osteoporosis or steroids) ASSESSMENT/PLAN: did not tolerate bisphosphonate, did injections for 2 years, does not want to do any further medication and refuses further reassessment with DEXA; takes vit D and walks daily      Screening for glaucoma(q1y if high risk - diabetes, FH, AA and > 50 or hispanic and > 65) ASSESSMENT/PLAN: utd or advised      Medical nutritional therapy for individuals with diabetes or renal disease ASSESSMENT/PLAN: see orders       Cardiovascular screening blood tests (lipids q5y) ASSESSMENT/PLAN: see orders and labs      Diabetes screening tests ASSESSMENT/PLAN: see orders and labs   7.) Summary:  Medicare annual wellness visit, subsequent -risk factors and conditions per above assessment were discussed and treatment, recommendations and referrals were offered per documentation above and orders and patient instructions. -pt wishes to call to schedule her mammogram - number provided -declines dexa, declines lipid test -fall prevention, lifestyle education provided  Recurrent major depressive disorder, in partial remission ([place]) -managed by psychiatrist  BMI 27.0-27.9,adult -lifestyle recommendations  Gastroesophageal reflux disease, esophagitis presence not specified -discussed tx options, advised lowest effective dose PPI - she is going to try lower dose nexium with higher dose prn - prefers to purchase OTC as feels other options did not work  Hx of osteoporosis -declines further testing or tx  Essential hypertension - Plan: Basic metabolic panel, CBC (no diff) -labs, cont current tx  Hyperlipidemia, unspecified hyperlipidemia type -declines further testing, agreeable to trying to eat better  Patient Instructions  BEFORE YOU LEAVE: -flu shot -labs -follow up: 4-6 months  Please call today to set up your mammogram.  We have ordered labs or studies at this visit. It can take up to 1-2 weeks for results and processing. IF results require follow up or explanation, we will call you with instructions. Clinically stable results will be released to your [facility]. If you have not heard from [place] or cannot find your results in [facility] in 2 weeks please contact our office at [phone].  If you are not yet signed up for [facility], please consider signing up.   We recommend the following healthy lifestyle for LIFE: 1) Small portions.   Tip: eat off of a salad plate instead of a dinner plate.  Tip: It is ok to  feel hungry after a meal  Tip: if you need more or a snack choose fruits, veggies and/or a handful of nuts or seeds.  2) Eat a healthy clean diet.  * Tip: Avoid (less then 1 serving per week): processed foods, sweets, sweetened drinks, white starches (rice, flour, bread, potatoes, pasta, etc), red meat, fast foods, butter  *Tip: CHOOSE instead   * 5-9 servings per day of fresh or frozen fruits and vegetables (but not corn, potatoes, bananas, canned or dried fruit)   *  nuts and seeds, beans   *olives and olive oil   *small portions of lean meats such as fish and white chicken    *small portions of whole grains  3)Get at least 150 minutes of sweaty aerobic exercise per week.  4)Reduce stress - consider counseling, meditation and relaxation to balance other aspects of your life.           Colin Benton R., DO

## 2016-03-17 NOTE — Progress Notes (Signed)
Pre visit review using our clinic review tool, if applicable. No additional management support is needed unless otherwise documented below in the visit note. 

## 2016-03-22 DIAGNOSIS — R3121 Asymptomatic microscopic hematuria: Secondary | ICD-10-CM | POA: Diagnosis not present

## 2016-04-23 ENCOUNTER — Other Ambulatory Visit: Payer: Self-pay | Admitting: Family Medicine

## 2016-04-23 ENCOUNTER — Other Ambulatory Visit (HOSPITAL_COMMUNITY): Payer: Self-pay | Admitting: Psychiatry

## 2016-05-10 DIAGNOSIS — F331 Major depressive disorder, recurrent, moderate: Secondary | ICD-10-CM | POA: Diagnosis not present

## 2016-07-24 ENCOUNTER — Encounter: Payer: Self-pay | Admitting: Family Medicine

## 2016-07-24 ENCOUNTER — Ambulatory Visit (INDEPENDENT_AMBULATORY_CARE_PROVIDER_SITE_OTHER): Payer: Medicare Other | Admitting: Family Medicine

## 2016-07-24 VITALS — BP 140/100 | HR 81 | Temp 97.8°F | Ht 62.0 in | Wt 158.0 lb

## 2016-07-24 DIAGNOSIS — E7800 Pure hypercholesterolemia, unspecified: Secondary | ICD-10-CM | POA: Diagnosis not present

## 2016-07-24 DIAGNOSIS — I10 Essential (primary) hypertension: Secondary | ICD-10-CM

## 2016-07-24 DIAGNOSIS — K219 Gastro-esophageal reflux disease without esophagitis: Secondary | ICD-10-CM | POA: Diagnosis not present

## 2016-07-24 DIAGNOSIS — R0789 Other chest pain: Secondary | ICD-10-CM | POA: Diagnosis not present

## 2016-07-24 LAB — CBC
HCT: 42 % (ref 36.0–46.0)
Hemoglobin: 14 g/dL (ref 12.0–15.0)
MCHC: 33.3 g/dL (ref 30.0–36.0)
MCV: 91.2 fl (ref 78.0–100.0)
Platelets: 357 10*3/uL (ref 150.0–400.0)
RBC: 4.6 Mil/uL (ref 3.87–5.11)
RDW: 13.4 % (ref 11.5–15.5)
WBC: 5.1 10*3/uL (ref 4.0–10.5)

## 2016-07-24 LAB — COMPREHENSIVE METABOLIC PANEL
ALT: 18 U/L (ref 0–35)
AST: 19 U/L (ref 0–37)
Albumin: 4.6 g/dL (ref 3.5–5.2)
Alkaline Phosphatase: 40 U/L (ref 39–117)
BUN: 16 mg/dL (ref 6–23)
CO2: 27 mEq/L (ref 19–32)
Calcium: 9.5 mg/dL (ref 8.4–10.5)
Chloride: 100 mEq/L (ref 96–112)
Creatinine, Ser: 0.84 mg/dL (ref 0.40–1.20)
GFR: 71.45 mL/min (ref >60.00)
Glucose, Bld: 110 mg/dL — ABNORMAL HIGH (ref 70–99)
Potassium: 3.9 mEq/L (ref 3.5–5.1)
Sodium: 136 mEq/L (ref 135–145)
Total Bilirubin: 0.4 mg/dL (ref 0.2–1.2)
Total Protein: 7.2 g/dL (ref 6.0–8.3)

## 2016-07-24 MED ORDER — SUCRALFATE 1 G PO TABS: 1.0000 g | ORAL_TABLET | Freq: Three times a day (TID) | ORAL | 1 refills | Status: DC

## 2016-07-24 MED ORDER — ASPIRIN EC 81 MG PO TBEC
81.0000 mg | DELAYED_RELEASE_TABLET | Freq: Every day | ORAL | 0 refills | Status: DC
Start: 2016-07-24 — End: 2017-03-16

## 2016-07-24 NOTE — Progress Notes (Signed)
Crystal Patrick is a 71 y.o. female is here to discuss:  History of Present Illness:   Chest Pain   This is a new problem. The current episode started today. The onset quality is sudden. The problem occurs constantly. The problem has been gradually improving. The pain is present in the substernal region and epigastric region. The pain is at a severity of 2/10. The pain is mild. The quality of the pain is described as dull and burning. The pain radiates to the mid back. Pertinent negatives include no abdominal pain, cough, diaphoresis, dizziness, exertional chest pressure, fever, headaches, irregular heartbeat, leg pain, lower extremity edema, malaise/fatigue, nausea, near-syncope, numbness, shortness of breath, syncope, vomiting or weakness. The pain is aggravated by nothing. She has tried nothing for the symptoms. Risk factors include obesity.  Her past medical history is significant for hyperlipidemia and hypertension.   PMHx, SurgHx, SocialHx, FamHx, Medications, and Allergies were reviewed in the Visit Navigator and updated as appropriate.    Patient Active Problem List   Diagnosis Date Noted  . MDD (major depressive disorder) 03/17/2016  . BMI 27.0-27.9,adult 03/17/2016  . Hx of osteoporosis 03/17/2016  . Essential hypertension 10/30/2014  . Seasonal allergies 10/30/2014  . GERD (gastroesophageal reflux disease) 06/02/2013  . Hyperlipidemia 06/02/2013  . Overactive bladder 06/02/2013    Social History  Substance Use Topics  . Smoking status: Never Smoker  . Smokeless tobacco: Never Used  . Alcohol use 0.0 oz/week     Comment: one drink a day   Current Medications and Allergies:    .  busPIRone (BUSPAR) 30 MG tablet, Take 30 mg by mouth 2 (two) times daily., Disp: , Rfl:  .  Cholecalciferol (VITAMIN D3) 2000 UNITS TABS, Take 2,000 Units by mouth daily., Disp: , Rfl:  .  Esomeprazole Magnesium (NEXIUM PO), Take 40 mg by mouth daily. , Disp: , Rfl:  .  fenofibrate 160 MG  tablet, TAKE 1 TABLET DAILY, Disp: 90 tablet, Rfl: 2 .  FLUoxetine (PROZAC) 40 MG capsule, 20 mg. Take 2 tablets by mouth once daily, Disp: , Rfl:  .  magnesium gluconate (MAGONATE) 500 MG tablet, Take 500 mg by mouth daily., Disp: , Rfl:  .  Potassium Gluconate 595 MG CAPS, Take 595 mg by mouth daily., Disp: , Rfl:  .  tolterodine (DETROL LA) 4 MG 24 hr capsule, TAKE 1 CAPSULE DAILY, Disp: 90 capsule, Rfl: 2 .  valsartan-hydrochlorothiazide (DIOVAN-HCT) 80-12.5 MG tablet, TAKE 1 TABLET DAILY, Disp: 90 tablet, Rfl: 2 .  zolpidem (AMBIEN) 10 MG tablet, Take 10 mg by mouth at bedtime as needed for sleep., Disp: , Rfl:    Allergies  Allergen Reactions  . Other     Hormone replacement therapy-per patient she cannot recall reaction  . Statins     Liver failue    Review of Systems   Review of Systems: The patient denies chills, fever, weight loss/gain, fatigue, lack of appetite, difficulty swallowing, sore throat, earache, post-nasal drip, palpitations, changes in blood pressures, swelling of legs, cough, dyspnea, wheezing, change in bowel habits, nausea, vomiting, changes in urination, joint or muscle pain, skin changes, dizziness, headaches, numbness, changes in balance or coordination, anxiety, depression, memory changes, swollen glands, easy bruising.   Vitals:   Vitals:   07/24/16 1133  BP: (!) 140/100  Pulse: 81  Temp: 97.8 F (36.6 C)  TempSrc: Oral  SpO2: 98%  Weight: 158 lb (71.7 kg)  Height: 5\' 2"  (1.575 m)     Body  mass index is 28.9 kg/m.   Physical Exam:     General Appearance:    Alert, cooperative, no distress, appears stated age  Head:    Normocephalic, without obvious abnormality, atraumatic  Eyes:    PERRL, conjunctiva/corneas clear, EOM's intact, fundi    benign, both eyes  Ears:    Normal TM's and external ear canals, both ears  Nose:   Nares normal, septum midline, mucosa normal, no drainage    or sinus tenderness  Throat:   Lips, mucosa, and tongue  normal; teeth and gums normal  Neck:   Supple, symmetrical, trachea midline, no adenopathy;    thyroid:  no enlargement/tenderness/nodules; no carotid   bruit or JVD  Back:     Symmetric, no curvature, ROM normal, no CVA tenderness  Lungs:     Clear to auscultation bilaterally, respirations unlabored  Chest Wall:    No tenderness or deformity   Heart:    Regular rate and rhythm, S1 and S2 normal, no murmur, rub   or gallop  Abdomen:     Soft, non-tender, bowel sounds active all four quadrants,    no masses, no organomegaly  Extremities:   Extremities normal, atraumatic, no cyanosis or edema  Pulses:   2+ and symmetric all extremities  Skin:   Skin color, texture, turgor normal, no rashes or lesions  Neurologic:   Grossly normal.   Results for orders placed or performed in visit on 07/24/16  CBC  Result Value Ref Range   WBC 5.1 4.0 - 10.5 K/uL   RBC 4.60 3.87 - 5.11 Mil/uL   Platelets 357.0 150.0 - 400.0 K/uL   Hemoglobin 14.0 12.0 - 15.0 g/dL   HCT 42.0 36.0 - 46.0 %   MCV 91.2 78.0 - 100.0 fl   MCHC 33.3 30.0 - 36.0 g/dL   RDW 13.4 11.5 - 15.5 %  Comprehensive metabolic panel  Result Value Ref Range   Sodium 136 135 - 145 mEq/L   Potassium 3.9 3.5 - 5.1 mEq/L   Chloride 100 96 - 112 mEq/L   CO2 27 19 - 32 mEq/L   Glucose, Bld 110 (H) 70 - 99 mg/dL   BUN 16 6 - 23 mg/dL   Creatinine, Ser 0.84 0.40 - 1.20 mg/dL   Total Bilirubin 0.4 0.2 - 1.2 mg/dL   Alkaline Phosphatase 40 39 - 117 U/L   AST 19 0 - 37 U/L   ALT 18 0 - 35 U/L   Total Protein 7.2 6.0 - 8.3 g/dL   Albumin 4.6 3.5 - 5.2 g/dL   Calcium 9.5 8.4 - 10.5 mg/dL   GFR 71.45 >60.00 mL/min     Assessment and Plan:    Aryssa was seen today for chest pain.  Diagnoses and all orders for this visit:  Atypical chest pain Comments: EKG, labs, and exam reassuring today. Mild improvement in symptoms with GI cocktail. Pain most likely due to dyspepsia. See below, Carafate Rx. History of EGD in past. Okay to go ahead and  ASA at this point. If pain worsens or changes, patient to ER immediately.  Orders: -     EKG 12-Lead - NSR, without concerning ST or T changes -     CBC -     Comprehensive metabolic panel  Essential hypertension -     aspirin EC 81 MG tablet; Take 1 tablet (81 mg total) by mouth daily.  Gastroesophageal reflux disease, esophagitis presence not specified -     sucralfate (CARAFATE) 1  g tablet; Take 1 tablet (1 g total) by mouth 4 (four) times daily -  with meals and at bedtime.  Pure hypercholesterolemia  . Reviewed expectations re: course of current medical issues. . Discussed self-management of symptoms. . Outlined signs and symptoms indicating need for more acute intervention. . Patient verbalized understanding and all questions were answered. . See orders for this visit as documented in the electronic medical record. . Patient received an After Visit Summary.   Briscoe Deutscher, Farmer, [place] 07/24/2016   Follow-up: No Follow-up on file.  Meds ordered this encounter  Medications  . aspirin EC 81 MG tablet    Sig: Take 1 tablet (81 mg total) by mouth daily.    Dispense:  90 tablet    Refill:  0  . sucralfate (CARAFATE) 1 g tablet    Sig: Take 1 tablet (1 g total) by mouth 4 (four) times daily -  with meals and at bedtime.    Dispense:  40 tablet    Refill:  1    Orders Placed This Encounter  Procedures  . CBC  . Comprehensive metabolic panel  . EKG 12-Lead

## 2016-07-24 NOTE — Progress Notes (Signed)
Pre visit review using our clinic review tool, if applicable. No additional management support is needed unless otherwise documented below in the visit note. 

## 2016-07-24 NOTE — Patient Instructions (Signed)
It was my pleasure to see you today!   1. Take your medications as prescribed.  2. Follow up as directed.  3. If your symptoms worsen or do not improve, please let us know. If you have any "RED FLAGS" that we reviewed today, please call 911 or proceed to the nearest Emergency Room.   Please contact us if you have any questions or concerns.   Alliah Boulanger, D.O. [facility]  

## 2016-07-25 ENCOUNTER — Telehealth: Payer: Self-pay | Admitting: *Deleted

## 2016-07-25 NOTE — Telephone Encounter (Signed)
I called the pt and she stated she is feeling better.  Stated she has an appt in April and feels OK to wait until this appt to follow up and patient stated she lives within walking distance to the [facility] office and felt it would be best to be seen somewhere close with chest pain. Message forwarded to Dr Maudie Mercury.

## 2016-07-25 NOTE — Progress Notes (Signed)
Crystal Patrick,  Can you call [name] and see how she is doing? I noticed she went to [place] yesterday for chest pain.  I noticed her BP was quite high at the visit. Make sure she has follow up here in the next few weeks. Please see if we can find out why she was scheduled elsewhere yesterday and apologize to her as we could have gotten her in here. Thanks!

## 2016-07-25 NOTE — Telephone Encounter (Signed)
-----   Message from [name], DO sent at 07/25/2016  7:00 AM EST -----   ----- Message ----- From: Briscoe Deutscher, DO Sent: 07/24/2016   7:19 PM To: [name], DO

## 2016-09-14 NOTE — Progress Notes (Signed)
HPI:  Crystal Patrick is a pleasant 71 y.o. here for follow up. Chronic medical problems summarized below were reviewed for changes and stability and were updated as needed below. These issues and their treatment remain stable for the most part. Seen at another clinic recently for atypical CP. Reports was at rest and after decreasing nexium dose. Felt was heartburn. No further symptoms after increasing nexium. Declines stress test or further evaluation. Has stable depression, no change, reports has been this way for 20+ years - sees psychiatry for management. New concern of sinus congestion. x2 weeks. Thick yellow sinus drainage. Occ sinus pressure/dizziness. No fevers, pain, sob, tooth pain. Going to the beach this weekend with a girlfriend.  Denies CP, SOB, DOE, treatment intolerance or new symptoms. Had labs at recent visit elsewhere, has refused further lipid checks.  Due for mammogram. AWV due in October.  Recent Rhinosinusitis: -resolved after abx  HLD/Overweight: -meds: fenofibrate, reported allergy to statins -she declined further testing - she does not want to do any further additional treatment for this  HTN: -meds: valsartan-hctz (80-12.5) -saw Dr. Juleen China in March for atypical CP -under a lot of stress with caring for mother -lots of stress - took care of twin grandchildren (70yo)  GERD/Dysphagia/constipation - IBS: -on PPI, was seeing GI, on nexium (insurance won't cover but she prefers to buy OTC) -hx esophageal stricture and hiatal hernia -normal EGD 07/2014 with Dr. Fuller Plan -reports: stable, reports worse if uses low-dose PPI  Depression/Anxiety/Insomnia: -managed by psychiatrist - [name] -meds: buspar, prozac, ambien  Osteoporosis: -did not tolerate bisphosphonate's -did injectable for 2 years -last DEXA remotely -she does not want to re-asses as does not want to take any further medications for this   ROS: See pertinent positives and negatives per  HPI.  Past Medical History:  Diagnosis Date  . Anxiety and depression    sees psychiatrist for this  . Arthritis   . Chicken pox   . GERD (gastroesophageal reflux disease)    with hiatal hernia, esophageal stricture  . Hiatal hernia   . Hx of osteoporosis 03/17/2016   -did not tol bisphosphonates -did injections -does not wish to re-assess or repeat DEXA -doing walking and Vit D  . Hyperlipidemia    has refused treatment or further evaluation other then lifestyle changes  . Hypertension   . Liver failure ([place])    due to Baycol  . Overactive bladder 06/02/2013  . Peptic ulcer   . Seasonal allergies   . Squamous cell carcinoma   . Status post dilation of esophageal narrowing     Past Surgical History:  Procedure Laterality Date  . APPENDECTOMY  1964  . BUNIONECTOMY Bilateral     1 foot x 2,   . NASAL/SINUS ENDOSCOPY     x 3  . TONSILLECTOMY AND ADENOIDECTOMY      Family History  Problem Relation Age of Onset  . Throat cancer Father   . Heart disease Father   . Thyroid cancer Sister   . Ovarian cancer Paternal Aunt   . Prostate cancer Brother   . Prostate cancer Maternal Uncle     x 2  . Bladder Cancer Maternal Uncle   . Diabetes Daughter   . Dementia Mother     Social History   Social History  . Marital status: Married    Spouse name: N/A  . Number of children: 3  . Years of education: N/A   Occupational History  . home maker  Social History Main Topics  . Smoking status: Never Smoker  . Smokeless tobacco: Never Used  . Alcohol use 0.0 oz/week     Comment: one drink a day  . Drug use: No  . Sexual activity: Not Asked   Other Topics Concern  . 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  aspirin EC 81  MG tablet, Take 1 tablet (81 mg total) by mouth daily., Disp: 90 tablet, Rfl: 0 .  BusPIRone HCl (BUSPAR PO), Take 20 mg by mouth 2 (two) times daily., Disp: , Rfl:  .  Cholecalciferol (VITAMIN D3) 2000 UNITS TABS, Take 2,000 Units by mouth daily., Disp: , Rfl:  .  Esomeprazole Magnesium (NEXIUM PO), Take 40 mg by mouth daily. , Disp: , Rfl:  .  fenofibrate 160 MG tablet, TAKE 1 TABLET DAILY, Disp: 90 tablet, Rfl: 2 .  FLUoxetine (PROZAC) 40 MG capsule, 20 mg. Take 2 tablets by mouth once daily, Disp: , Rfl:  .  magnesium gluconate (MAGONATE) 500 MG tablet, Take 500 mg by mouth daily., Disp: , Rfl:  .  Potassium Gluconate 595 MG CAPS, Take 595 mg by mouth daily., Disp: , Rfl:  .  sucralfate (CARAFATE) 1 g tablet, Take 1 tablet (1 g total) by mouth 4 (four) times daily -  with meals and at bedtime., Disp: 40 tablet, Rfl: 1 .  tolterodine (DETROL LA) 4 MG 24 hr capsule, TAKE 1 CAPSULE DAILY, Disp: 90 capsule, Rfl: 2 .  valsartan-hydrochlorothiazide (DIOVAN-HCT) 80-12.5 MG tablet, TAKE 1 TABLET DAILY, Disp: 90 tablet, Rfl: 2 .  zolpidem (AMBIEN) 10 MG tablet, Take 10 mg by mouth at bedtime as needed for sleep., Disp: , Rfl:  .  doxycycline (VIBRA-TABS) 100 MG tablet, Take 1 tablet (100 mg total) by mouth 2 (two) times daily., Disp: 14 tablet, Rfl: 0  EXAM:  Vitals:   09/15/16 0931  BP: 118/80  Pulse: 77  Temp: 98.1 F (36.7 C)    Body mass index is 27.87 kg/m.  GENERAL: vitals reviewed and listed above, alert, oriented, appears well hydrated and in no acute distress  HEENT: atraumatic, conjunttiva clear, no obvious abnormalities on inspection of external nose and ears, normal appearance of ear canals and TMs, clear nasal congestion, mild post oropharyngeal erythema with PND, no tonsillar edema or exudate, no sinus TTP  NECK: no obvious masses on inspection  LUNGS: clear to auscultation bilaterally, no wheezes, rales or rhonchi, good air movement  CV: HRRR, no peripheral edema  MS:  moves all extremities without noticeable abnormality  PSYCH: pleasant and cooperative, no obvious depression or anxiety  ASSESSMENT AND PLAN:  Discussed the following assessment and plan:  Acute non-recurrent sinusitis, unspecified location  Gastroesophageal reflux disease, esophagitis presence not specified  Recurrent major depressive disorder, in partial remission ([place])  Essential hypertension  Pure hypercholesterolemia  BMI 27.0-27.9,adult  -OTC options for likely VURI, delayed abx after discussion risks in case worsening or not continuing to improve or signs symptoms sinusitis -she confirms depression unchanged, managed by psychiatry -lifestyle recs -offered stress test given recent atypical cp, she declined - cont PPI -Patient advised to return or notify a doctor immediately  if symptoms worsen or persist or new concerns arise.  Patient Instructions  BEFORE YOU LEAVE: -follow up: October for Medicare exam with Dr. Patrici Ranks in there! Enjoy the beach weekend and try to get away once a month if you can.  See the psychiatrist as planned and sooner if any changes or new symptoms or worsening depression.  Please schedule mammogram.  We recommend the following healthy lifestyle for LIFE: 1) Small portions.   Tip: eat off of a salad plate instead of a dinner plate.  Tip: if you need more or a snack choose fruits, veggies and/or a handful of nuts or seeds.  2) Eat a healthy clean diet.  * Tip: Avoid (less then 1 serving per week): processed foods, sweets, sweetened drinks, white starches (rice, flour, bread, potatoes, pasta, etc), red meat, fast foods, butter  *Tip: CHOOSE instead   * 5-9 servings per day of fresh or frozen fruits and vegetables (but not corn, potatoes, bananas, canned or dried fruit)   *nuts and seeds, beans   *olives and olive oil   *small portions of lean meats such as fish and white chicken    *small portions of whole grains  3)Get at least 150  minutes of sweaty aerobic exercise per week.  4)Reduce stress - consider counseling, meditation and relaxation to balance other aspects of your life.     Colin Benton R., DO

## 2016-09-15 ENCOUNTER — Encounter: Payer: Self-pay | Admitting: Family Medicine

## 2016-09-15 ENCOUNTER — Ambulatory Visit (INDEPENDENT_AMBULATORY_CARE_PROVIDER_SITE_OTHER): Payer: Medicare Other | Admitting: Family Medicine

## 2016-09-15 VITALS — BP 118/80 | HR 77 | Temp 98.1°F | Ht 62.0 in | Wt 152.4 lb

## 2016-09-15 DIAGNOSIS — K219 Gastro-esophageal reflux disease without esophagitis: Secondary | ICD-10-CM | POA: Diagnosis not present

## 2016-09-15 DIAGNOSIS — J019 Acute sinusitis, unspecified: Secondary | ICD-10-CM

## 2016-09-15 DIAGNOSIS — E7800 Pure hypercholesterolemia, unspecified: Secondary | ICD-10-CM | POA: Diagnosis not present

## 2016-09-15 DIAGNOSIS — Z6827 Body mass index (BMI) 27.0-27.9, adult: Secondary | ICD-10-CM

## 2016-09-15 DIAGNOSIS — F3341 Major depressive disorder, recurrent, in partial remission: Secondary | ICD-10-CM

## 2016-09-15 DIAGNOSIS — I10 Essential (primary) hypertension: Secondary | ICD-10-CM | POA: Diagnosis not present

## 2016-09-15 MED ORDER — DOXYCYCLINE HYCLATE 100 MG PO TABS
100.0000 mg | ORAL_TABLET | Freq: Two times a day (BID) | ORAL | 0 refills | Status: DC
Start: 2016-09-15 — End: 2017-03-16

## 2016-09-15 NOTE — Patient Instructions (Addendum)
BEFORE YOU LEAVE: -follow up: October for Medicare exam with Dr. Patrici Ranks in there! Enjoy the beach weekend and try to get away once a month if you can.  See the psychiatrist as planned and sooner if any changes or new symptoms or worsening depression.  Please schedule mammogram.  We recommend the following healthy lifestyle for LIFE: 1) Small portions.   Tip: eat off of a salad plate instead of a dinner plate.  Tip: if you need more or a snack choose fruits, veggies and/or a handful of nuts or seeds.  2) Eat a healthy clean diet.  * Tip: Avoid (less then 1 serving per week): processed foods, sweets, sweetened drinks, white starches (rice, flour, bread, potatoes, pasta, etc), red meat, fast foods, butter  *Tip: CHOOSE instead   * 5-9 servings per day of fresh or frozen fruits and vegetables (but not corn, potatoes, bananas, canned or dried fruit)   *nuts and seeds, beans   *olives and olive oil   *small portions of lean meats such as fish and white chicken    *small portions of whole grains  3)Get at least 150 minutes of sweaty aerobic exercise per week.  4)Reduce stress - consider counseling, meditation and relaxation to balance other aspects of your life.

## 2016-09-15 NOTE — Progress Notes (Signed)
Pre visit review using our clinic review tool, if applicable. No additional management support is needed unless otherwise documented below in the visit note. 

## 2016-09-26 ENCOUNTER — Telehealth: Payer: Self-pay | Admitting: Family Medicine

## 2016-09-26 NOTE — Telephone Encounter (Signed)
Called pt to let her know that we need to cancel her AWV appt on 5/15 because she is not due for this appt until Oct.  Left a message for her to call me back.

## 2016-10-03 ENCOUNTER — Ambulatory Visit: Payer: Medicare Other

## 2016-10-04 DIAGNOSIS — F331 Major depressive disorder, recurrent, moderate: Secondary | ICD-10-CM | POA: Diagnosis not present

## 2016-12-25 DIAGNOSIS — Z961 Presence of intraocular lens: Secondary | ICD-10-CM | POA: Diagnosis not present

## 2017-01-02 ENCOUNTER — Other Ambulatory Visit: Payer: Self-pay | Admitting: Family Medicine

## 2017-01-02 DIAGNOSIS — F331 Major depressive disorder, recurrent, moderate: Secondary | ICD-10-CM | POA: Diagnosis not present

## 2017-02-08 ENCOUNTER — Encounter: Payer: Self-pay | Admitting: Family Medicine

## 2017-02-21 DIAGNOSIS — Z23 Encounter for immunization: Secondary | ICD-10-CM | POA: Diagnosis not present

## 2017-03-16 ENCOUNTER — Encounter: Payer: Self-pay | Admitting: Family Medicine

## 2017-03-16 ENCOUNTER — Ambulatory Visit (INDEPENDENT_AMBULATORY_CARE_PROVIDER_SITE_OTHER): Payer: Medicare Other | Admitting: Family Medicine

## 2017-03-16 VITALS — BP 120/84 | HR 82 | Temp 98.2°F | Ht 62.0 in | Wt 158.9 lb

## 2017-03-16 DIAGNOSIS — R739 Hyperglycemia, unspecified: Secondary | ICD-10-CM | POA: Diagnosis not present

## 2017-03-16 DIAGNOSIS — R5383 Other fatigue: Secondary | ICD-10-CM | POA: Diagnosis not present

## 2017-03-16 DIAGNOSIS — F3341 Major depressive disorder, recurrent, in partial remission: Secondary | ICD-10-CM

## 2017-03-16 DIAGNOSIS — Z6829 Body mass index (BMI) 29.0-29.9, adult: Secondary | ICD-10-CM

## 2017-03-16 DIAGNOSIS — I10 Essential (primary) hypertension: Secondary | ICD-10-CM

## 2017-03-16 DIAGNOSIS — H6983 Other specified disorders of Eustachian tube, bilateral: Secondary | ICD-10-CM | POA: Diagnosis not present

## 2017-03-16 DIAGNOSIS — J0100 Acute maxillary sinusitis, unspecified: Secondary | ICD-10-CM

## 2017-03-16 DIAGNOSIS — E7800 Pure hypercholesterolemia, unspecified: Secondary | ICD-10-CM

## 2017-03-16 LAB — CBC
HCT: 42.5 % (ref 36.0–46.0)
Hemoglobin: 14.3 g/dL (ref 12.0–15.0)
MCHC: 33.6 g/dL (ref 30.0–36.0)
MCV: 92.4 fl (ref 78.0–100.0)
Platelets: 349 10*3/uL (ref 150.0–400.0)
RBC: 4.6 Mil/uL (ref 3.87–5.11)
RDW: 13.5 % (ref 11.5–15.5)
WBC: 4.6 10*3/uL (ref 4.0–10.5)

## 2017-03-16 LAB — BASIC METABOLIC PANEL
BUN: 19 mg/dL (ref 6–23)
CO2: 27 meq/L (ref 19–32)
CREATININE: 0.72 mg/dL (ref 0.40–1.20)
Calcium: 9.7 mg/dL (ref 8.4–10.5)
Chloride: 100 mEq/L (ref 96–112)
GFR: 85.2 mL/min (ref >60.00)
Glucose, Bld: 94 mg/dL (ref 70–99)
Potassium: 4.1 mEq/L (ref 3.5–5.1)
Sodium: 135 mEq/L (ref 135–145)

## 2017-03-16 LAB — TSH: TSH: 0.93 u[IU]/mL (ref 0.35–4.50)

## 2017-03-16 LAB — HEMOGLOBIN A1C: HEMOGLOBIN A1C: 6 % (ref 4.6–6.5)

## 2017-03-16 MED ORDER — AMOXICILLIN-POT CLAVULANATE 875-125 MG PO TABS: 1.0000 | ORAL_TABLET | Freq: Two times a day (BID) | ORAL | 0 refills | Status: DC

## 2017-03-16 NOTE — Progress Notes (Signed)
HPI:  Acute visit for multiple problems. She reports her allergies have been worse over the last 4 weeks. She has had nasal congestion, postnasal drip, sneezing and has been feeling more tired than usual. She also now has pain in both the ears and some sinus discomfort on the left. She has thick nasal congestion. She denies fevers, weight loss, shortness of breath, wheezing. She has had a significant amount of stress and anxiety. She reports she does not sleep well at all.She sees her psychiatrist for this. No suicidal ideation or thoughts of self-harm. She has an appointment coming up.  HLD/Overweight: -meds: fenofibrate, reported allergy to statins -she declined further testing - she does not want to do any further additional treatment for this  HTN: -meds: valsartan-hctz (80-12.5) -saw Dr. Juleen China in March for atypical CP -under a lot of stress with caring for mother -lots of stress - took care of twin grandchildren (69yo)  GERD/Dysphagia/constipation - IBS: -on PPI, was seeing GI, on nexium (insurance won't cover but she prefers to buy OTC) -hx esophageal stricture and hiatal hernia -normal EGD 07/2014 with Dr. Fuller Plan -reports: stable, reports worse if uses low-dose PPI  Depression/Anxiety/Insomnia: -managed by psychiatrist - [name] -meds: buspar, prozac, ambien  Osteoporosis: -did not tolerate bisphosphonate's -did injectable for 2 years -last DEXA remotely -she does not want to re-asses as does not want to take any further medications for this  ROS: See pertinent positives and negatives per HPI.  Past Medical History:  Diagnosis Date  . Anxiety and depression    sees psychiatrist for this  . Arthritis   . Chicken pox   . GERD (gastroesophageal reflux disease)    with hiatal hernia, esophageal stricture  . Hiatal hernia   . Hx of osteoporosis 03/17/2016   -did not tol bisphosphonates -did injections -does not wish to re-assess or repeat DEXA -doing walking  and Vit D  . Hyperlipidemia    has refused treatment or further evaluation other then lifestyle changes  . Hypertension   . Liver failure ([place])    due to Baycol  . Overactive bladder 06/02/2013  . Peptic ulcer   . Seasonal allergies   . Squamous cell carcinoma   . Status post dilation of esophageal narrowing     Past Surgical History:  Procedure Laterality Date  . APPENDECTOMY  1964  . BUNIONECTOMY Bilateral     1 foot x 2,   . NASAL/SINUS ENDOSCOPY     x 3  . TONSILLECTOMY AND ADENOIDECTOMY      Family History  Problem Relation Age of Onset  . Throat cancer Father   . Heart disease Father   . Thyroid cancer Sister   . Ovarian cancer Paternal Aunt   . Prostate cancer Brother   . Prostate cancer Maternal Uncle        x 2  . Bladder Cancer Maternal Uncle   . Diabetes Daughter   . Dementia Mother     Social History   Social History  . Marital status: Married    Spouse name: N/A  . Number of children: 3  . Years of education: N/A   Occupational History  . home maker    Social History Main Topics  . Smoking status: Never Smoker  . Smokeless tobacco: Never Used  . Alcohol use 0.0 oz/week     Comment: one drink a day  . Drug use: No  . Sexual activity: Not Asked   Other Topics Concern  . 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  BusPIRone HCl (BUSPAR PO), Take 20 mg by mouth 2 (two) times daily., Disp: , Rfl:  .  Cholecalciferol (VITAMIN D3) 2000 UNITS TABS, Take 2,000 Units by mouth daily., Disp: , Rfl:  .  DETROL LA 4 MG 24 hr capsule, TAKE 1 CAPSULE DAILY, Disp: 90 capsule, Rfl: 0 .  Esomeprazole Magnesium (NEXIUM PO), Take 40 mg by mouth daily. , Disp: , Rfl:  .  fenofibrate 160 MG tablet, TAKE 1 TABLET  DAILY, Disp: 90 tablet, Rfl: 0 .  FLUoxetine (PROZAC) 40 MG capsule, 20 mg. Take 2 tablets by mouth once daily, Disp: , Rfl:  .  magnesium gluconate (MAGONATE) 500 MG tablet, Take 500 mg by mouth daily., Disp: , Rfl:  .  Potassium Gluconate 595 MG CAPS, Take 595 mg by mouth daily., Disp: , Rfl:  .  valsartan-hydrochlorothiazide (DIOVAN-HCT) 80-12.5 MG tablet, TAKE 1 TABLET DAILY, Disp: 90 tablet, Rfl: 0 .  zolpidem (AMBIEN) 10 MG tablet, Take 10 mg by mouth at bedtime as needed for sleep., Disp: , Rfl:  .  amoxicillin-clavulanate (AUGMENTIN) 875-125 MG tablet, Take 1 tablet by mouth 2 (two) times daily., Disp: 14 tablet, Rfl: 0  EXAM:  Vitals:   03/16/17 0920  BP: 120/84  Pulse: 82  Temp: 98.2 F (36.8 C)  SpO2: 97%    Body mass index is 29.06 kg/m.  GENERAL: vitals reviewed and listed above, alert, oriented, appears well hydrated and in no acute distress  HEENT: atraumatic, conjunttiva clear, no obvious abnormalities on inspection of external nose and ears  NECK: no obvious masses on inspection  LUNGS: clear to auscultation bilaterally, no wheezes, rales or rhonchi, good air movement  CV: HRRR, no peripheral edema  MS: moves all extremities without noticeable abnormality  PSYCH: pleasant and cooperative, no obvious depression or anxiety  ASSESSMENT AND PLAN:  Discussed the following assessment and plan:  Acute non-recurrent maxillary sinusitis  Dysfunction of both eustachian tubes  Fatigue, unspecified type - Plan: TSH  Recurrent major depressive disorder, in partial remission ([facility])  BMI 29.0-29.9,adult  Essential hypertension - Plan: Basic metabolic panel, CBC  Pure hypercholesterolemia  Hyperglycemia - Plan: Hemoglobin A1c  -she is due for basic labs so we will get those cancer mother labs ( see orders) to evaluate for causes of fatigue, especially since she is feeling more tired than usual -Suspect that most of her symptoms are from a sinus infection in  combination with her psychiatric health issues, discussed options and side effects for treatment will treat with Augmentin -optimize allergy regimen -Also advised follow-up with her psychiatrist -Patient advised to return or notify a doctor immediately if symptoms worsen or persist or new concerns arise.  Patient Instructions  BEFORE YOU LEAVE: -phq9 -labs -follow up: 1 month  Take the antibiotic (Augmentin) as advised  Start nasocort and zyrtec (1/2 tablet nightly) per instructions (these are both over the counter)  Follow up with your psychiatrist promptly  We have ordered labs or studies at this visit. It can take up to 1-2 weeks for results and processing. IF results require follow up or explanation, we will call you with instructions.  Clinically stable results will be released to your [facility]. If you have not heard from [place] or cannot find your results in [facility] in 2 weeks please contact our office at [phone].  If you are not yet signed up for [facility], please consider signing up.  I hope you are feeling better soon! Seek care immediately if worsening, new concerns or you are not improving with treatment.     Colin Benton R., DO

## 2017-03-16 NOTE — Patient Instructions (Addendum)
BEFORE YOU LEAVE: -phq9 -labs -follow up: 1 month  Take the antibiotic (Augmentin) as advised  Start nasocort and zyrtec (1/2 tablet nightly) per instructions (these are both over the counter)  Follow up with your psychiatrist promptly  We have ordered labs or studies at this visit. It can take up to 1-2 weeks for results and processing. IF results require follow up or explanation, we will call you with instructions. Clinically stable results will be released to your [facility]. If you have not heard from [place] or cannot find your results in [facility] in 2 weeks please contact our office at [phone].  If you are not yet signed up for [facility], please consider signing up.  I hope you are feeling better soon! Seek care immediately if worsening, new concerns or you are not improving with treatment.

## 2017-03-22 DIAGNOSIS — D225 Melanocytic nevi of trunk: Secondary | ICD-10-CM | POA: Diagnosis not present

## 2017-03-22 DIAGNOSIS — C44722 Squamous cell carcinoma of skin of right lower limb, including hip: Secondary | ICD-10-CM | POA: Diagnosis not present

## 2017-03-22 DIAGNOSIS — L814 Other melanin hyperpigmentation: Secondary | ICD-10-CM | POA: Diagnosis not present

## 2017-03-22 DIAGNOSIS — D485 Neoplasm of uncertain behavior of skin: Secondary | ICD-10-CM | POA: Diagnosis not present

## 2017-03-22 DIAGNOSIS — L218 Other seborrheic dermatitis: Secondary | ICD-10-CM | POA: Diagnosis not present

## 2017-03-22 DIAGNOSIS — L821 Other seborrheic keratosis: Secondary | ICD-10-CM | POA: Diagnosis not present

## 2017-03-22 DIAGNOSIS — L570 Actinic keratosis: Secondary | ICD-10-CM | POA: Diagnosis not present

## 2017-04-02 ENCOUNTER — Other Ambulatory Visit: Payer: Self-pay | Admitting: Family Medicine

## 2017-04-02 DIAGNOSIS — F331 Major depressive disorder, recurrent, moderate: Secondary | ICD-10-CM | POA: Diagnosis not present

## 2017-04-03 ENCOUNTER — Other Ambulatory Visit: Payer: Self-pay | Admitting: Family Medicine

## 2017-04-06 ENCOUNTER — Ambulatory Visit (INDEPENDENT_AMBULATORY_CARE_PROVIDER_SITE_OTHER): Payer: Medicare Other | Admitting: Family Medicine

## 2017-04-06 ENCOUNTER — Encounter: Payer: Self-pay | Admitting: Family Medicine

## 2017-04-06 VITALS — BP 124/80 | HR 76 | Temp 98.0°F | Ht 62.0 in | Wt 160.2 lb

## 2017-04-06 DIAGNOSIS — H6983 Other specified disorders of Eustachian tube, bilateral: Secondary | ICD-10-CM | POA: Diagnosis not present

## 2017-04-06 DIAGNOSIS — J309 Allergic rhinitis, unspecified: Secondary | ICD-10-CM

## 2017-04-06 DIAGNOSIS — F3341 Major depressive disorder, recurrent, in partial remission: Secondary | ICD-10-CM

## 2017-04-06 NOTE — Patient Instructions (Signed)
Follow up in 3-4 months (cancel other follow up if scheduled)  Nasal saline twice daily a long with allergy regimen  Massage twice daily for ear issues

## 2017-04-06 NOTE — Progress Notes (Signed)
HPI:  And is a very pleasant 71 year old here for follow-up of sinus issues, allergic rhinitis, eustachian tube dysfunction and fatigue.  She reports she is doing much better with resolution of the malaise and fatigue when she was treated with an antibiotic for the sinus infection.  She also is seeing her psychiatrist and her mood has improved.  Most of her sinus/allergy symptoms have improved with a daily intranasal steroid and antihistamine.  She does still occasionally have some fullness and popping in the ears.  Denies fevers, pain, sinus pressure, hearing loss or ear pain.  ROS: See pertinent positives and negatives per HPI.  Past Medical History:  Diagnosis Date  . Anxiety and depression    sees psychiatrist for this  . Arthritis   . Chicken pox   . GERD (gastroesophageal reflux disease)    with hiatal hernia, esophageal stricture  . Hiatal hernia   . Hx of osteoporosis 03/17/2016   -did not tol bisphosphonates -did injections -does not wish to re-assess or repeat DEXA -doing walking and Vit D  . Hyperlipidemia    has refused treatment or further evaluation other then lifestyle changes  . Hypertension   . Liver failure ([place])    due to Baycol  . Overactive bladder 06/02/2013  . Peptic ulcer   . Seasonal allergies   . Squamous cell carcinoma   . Status post dilation of esophageal narrowing     Past Surgical History:  Procedure Laterality Date  . APPENDECTOMY  1964  . BUNIONECTOMY Bilateral     1 foot x 2,   . NASAL/SINUS ENDOSCOPY     x 3  . TONSILLECTOMY AND ADENOIDECTOMY      Family History  Problem Relation Age of Onset  . Throat cancer Father   . Heart disease Father   . Thyroid cancer Sister   . Ovarian cancer Paternal Aunt   . Prostate cancer Brother   . Prostate cancer Maternal Uncle        x 2  . Bladder Cancer Maternal Uncle   . Diabetes Daughter   . Dementia Mother     Social History   Socioeconomic History  . Marital status: Married    Spouse  name: None  . Number of children: 3  . Years of education: None  . Highest education level: None  Social Needs  . Financial resource strain: None  . Food insecurity - worry: None  . Food insecurity - inability: None  . Transportation needs - medical: None  . Transportation needs - non-medical: None  Occupational History  . Occupation: home maker  Tobacco Use  . Smoking status: Never Smoker  . Smokeless tobacco: Never Used  Substance and Sexual Activity  . Alcohol use: Yes    Alcohol/week: 0.0 oz    Comment: one drink a day  . Drug use: No  . Sexual activity: None  Other Topics Concern  . 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  BusPIRone HCl (BUSPAR PO), Take 20 mg by mouth 2 (two) times daily., Disp: , Rfl:  .  Cholecalciferol (VITAMIN D3) 2000 UNITS TABS, Take 2,000 Units by mouth daily., Disp: ,  Rfl:  .  DETROL LA 4 MG 24 hr capsule, TAKE 1 CAPSULE DAILY (NEEDS TO SCHEDULE PHYSICAL EXAM), Disp: 90 capsule, Rfl: 1 .  Esomeprazole Magnesium (NEXIUM PO), Take 40 mg by mouth daily. , Disp: , Rfl:  .  fenofibrate 160 MG tablet, TAKE 1 TABLET DAILY (NEEDS TO SCHEDULE PHYSICAL EXAM), Disp: 90 tablet, Rfl: 1 .  FLUoxetine (PROZAC) 40 MG capsule, 20 mg. Take 2 tablets by mouth once daily, Disp: , Rfl:  .  magnesium gluconate (MAGONATE) 500 MG tablet, Take 500 mg by mouth daily., Disp: , Rfl:  .  Potassium Gluconate 595 MG CAPS, Take 595 mg by mouth daily., Disp: , Rfl:  .  valsartan-hydrochlorothiazide (DIOVAN-HCT) 80-12.5 MG tablet, TAKE 1 TABLET DAILY (NEEDS TO SCHEDULE PHYSICAL EXAM), Disp: 90 tablet, Rfl: 1 .  zolpidem (AMBIEN) 10 MG tablet, Take 10 mg by mouth at bedtime as needed for sleep., Disp: , Rfl:   EXAM:  Vitals:   04/06/17  1139  BP: 124/80  Pulse: 76  Temp: 98 F (36.7 C)    Body mass index is 29.3 kg/m.  GENERAL: vitals reviewed and listed above, alert, oriented, appears well hydrated and in no acute distress  HEENT: atraumatic, conjunttiva clear, no obvious abnormalities on inspection of external nose and ears, normal appearance of both ear canals, she has small effusions of both middle ears, these are clear without purulence and without bulging or erythema of the eardrums, normal appearance of both nares and oropharynx  NECK: no obvious masses on inspection  LUNGS: clear to auscultation bilaterally, no wheezes, rales or rhonchi, good air movement  CV: HRRR, no peripheral edema  MS: moves all extremities without noticeable abnormality  PSYCH: pleasant and cooperative, no obvious depression or anxiety  ASSESSMENT AND PLAN:  Discussed the following assessment and plan:  Dysfunction of both eustachian tubes  Allergic rhinitis, unspecified seasonality, unspecified trigger  Recurrent major depressive disorder, in partial remission ([place])  -I am glad she is doing so much better -I did advise that she start nasal saline twice daily and gentle massage of the periauricular region anterior to the ears to help with the eustachian tube dysfunction -Continue treatment of depression with her psychiatrist -see PHQ 9, she reports she is doing better, no severe symptoms -Follow-up in 3-4 months, sooner as needed -Patient advised to return or notify a doctor immediately if symptoms worsen or persist or new concerns arise.  Patient Instructions  Follow up in 3-4 months (cancel other follow up if scheduled)  Nasal saline twice daily a long with allergy regimen  Massage twice daily for ear issues   Colin Benton R., DO

## 2017-05-10 DIAGNOSIS — L905 Scar conditions and fibrosis of skin: Secondary | ICD-10-CM | POA: Diagnosis not present

## 2017-05-10 DIAGNOSIS — C44722 Squamous cell carcinoma of skin of right lower limb, including hip: Secondary | ICD-10-CM | POA: Diagnosis not present

## 2017-05-10 DIAGNOSIS — L570 Actinic keratosis: Secondary | ICD-10-CM | POA: Diagnosis not present

## 2017-05-11 ENCOUNTER — Telehealth: Payer: Self-pay | Admitting: Family Medicine

## 2017-05-11 NOTE — Telephone Encounter (Signed)
Left message for patient asking her to contact her pharmacy to find out if her rx is part of the recall, as we cannot check this information. They will be able to run the manufacturer and lot nbr to see if it is under recall, and if so they need to send [place] a request for a new prescription. If her rx is not under recall she should continue to take her medication as usual.

## 2017-05-11 NOTE — Telephone Encounter (Signed)
Copied from [place] [id number]. Topic: Quick Communication - See Telephone Encounter >> May 11, 2017  2:26 PM Ether Griffins B wrote: CRM for notification. See Telephone encounter for:  Pt contacted pharmacy about recall on valsartan and was told to call the office.  05/11/17.

## 2017-05-31 ENCOUNTER — Encounter: Payer: Self-pay | Admitting: Family Medicine

## 2017-05-31 DIAGNOSIS — L578 Other skin changes due to chronic exposure to nonionizing radiation: Secondary | ICD-10-CM | POA: Diagnosis not present

## 2017-05-31 DIAGNOSIS — L570 Actinic keratosis: Secondary | ICD-10-CM | POA: Diagnosis not present

## 2017-05-31 DIAGNOSIS — Z85828 Personal history of other malignant neoplasm of skin: Secondary | ICD-10-CM | POA: Diagnosis not present

## 2017-06-18 ENCOUNTER — Encounter: Payer: Self-pay | Admitting: Family Medicine

## 2017-06-18 ENCOUNTER — Ambulatory Visit (INDEPENDENT_AMBULATORY_CARE_PROVIDER_SITE_OTHER): Payer: Medicare Other | Admitting: Family Medicine

## 2017-06-18 VITALS — BP 122/80 | HR 83 | Temp 98.2°F | Ht 62.0 in | Wt 163.7 lb

## 2017-06-18 DIAGNOSIS — J0100 Acute maxillary sinusitis, unspecified: Secondary | ICD-10-CM | POA: Diagnosis not present

## 2017-06-18 MED ORDER — DOXYCYCLINE HYCLATE 100 MG PO TABS: 100.0000 mg | ORAL_TABLET | Freq: Two times a day (BID) | ORAL | 0 refills | Status: DC

## 2017-06-18 NOTE — Progress Notes (Signed)
HPI:  Acute visit for respiratory illness: -started: 2-3 weeks ago  -symptoms:nasal congestion, sore throat, cough, now with thicker nasal congestion sinus pressure and discomfort -denies:fever, SOB, NVD, tooth pain -has tried:Robitussin -sick contacts/travel/risks: no reported flu, strep or tick exposure -Hx of: Sinusitis, reports last time she had this and we gave her an antibiotic, she ended up not taking the antibiotic and did discard that prescription ROS: See pertinent positives and negatives per HPI.  Past Medical History:  Diagnosis Date  . Anxiety and depression    sees psychiatrist for this  . Arthritis   . Chicken pox   . GERD (gastroesophageal reflux disease)    with hiatal hernia, esophageal stricture  . Hiatal hernia   . Hx of osteoporosis 03/17/2016   -did not tol bisphosphonates -did injections -does not wish to re-assess or repeat DEXA -doing walking and Vit D  . Hyperlipidemia    has refused treatment or further evaluation other then lifestyle changes  . Hypertension   . Liver failure ([place])    due to Baycol  . Overactive bladder 06/02/2013  . Peptic ulcer   . Seasonal allergies   . Squamous cell carcinoma   . Status post dilation of esophageal narrowing     Past Surgical History:  Procedure Laterality Date  . APPENDECTOMY  1964  . BUNIONECTOMY Bilateral     1 foot x 2,   . NASAL/SINUS ENDOSCOPY     x 3  . TONSILLECTOMY AND ADENOIDECTOMY      Family History  Problem Relation Age of Onset  . Throat cancer Father   . Heart disease Father   . Thyroid cancer Sister   . Ovarian cancer Paternal Aunt   . Prostate cancer Brother   . Prostate cancer Maternal Uncle        x 2  . Bladder Cancer Maternal Uncle   . Diabetes Daughter   . Dementia Mother     Social History   Socioeconomic History  . Marital status: Married    Spouse name: None  . Number of children: 3  . Years of education: None  . Highest education level: None  Social Needs  .  Financial resource strain: None  . Food insecurity - worry: None  . Food insecurity - inability: None  . Transportation needs - medical: None  . Transportation needs - non-medical: None  Occupational History  . Occupation: home maker  Tobacco Use  . Smoking status: Never Smoker  . Smokeless tobacco: Never Used  Substance and Sexual Activity  . Alcohol use: Yes    Alcohol/week: 0.0 oz    Comment: one drink a day  . Drug use: No  . Sexual activity: None  Other Topics Concern  . 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  BusPIRone HCl (BUSPAR PO), Take 20 mg by mouth 2 (two) times daily., Disp: , Rfl:  .  Cholecalciferol (VITAMIN D3) 2000 UNITS TABS, Take 2,000 Units by mouth daily., Disp: , Rfl:  .  DETROL LA 4 MG 24 hr capsule, TAKE 1 CAPSULE DAILY (NEEDS TO SCHEDULE PHYSICAL EXAM), Disp: 90 capsule, Rfl: 1 .  Esomeprazole Magnesium (  NEXIUM PO), Take 40 mg by mouth daily. , Disp: , Rfl:  .  fenofibrate 160 MG tablet, TAKE 1 TABLET DAILY (NEEDS TO SCHEDULE PHYSICAL EXAM), Disp: 90 tablet, Rfl: 1 .  FLUoxetine (PROZAC) 40 MG capsule, 20 mg. Take 2 tablets by mouth once daily, Disp: , Rfl:  .  magnesium gluconate (MAGONATE) 500 MG tablet, Take 500 mg by mouth daily., Disp: , Rfl:  .  Potassium Gluconate 595 MG CAPS, Take 595 mg by mouth daily., Disp: , Rfl:  .  valsartan-hydrochlorothiazide (DIOVAN-HCT) 80-12.5 MG tablet, TAKE 1 TABLET DAILY (NEEDS TO SCHEDULE PHYSICAL EXAM), Disp: 90 tablet, Rfl: 1 .  zolpidem (AMBIEN) 10 MG tablet, Take 10 mg by mouth at bedtime as needed for sleep., Disp: , Rfl:  .  doxycycline (VIBRA-TABS) 100 MG tablet, Take 1 tablet (100 mg total) by mouth 2 (two) times daily., Disp: 14 tablet, Rfl: 0  EXAM:  Vitals:    06/18/17 1025  BP: 122/80  Pulse: 83  Temp: 98.2 F (36.8 C)  SpO2: 98%    Body mass index is 29.94 kg/m.  GENERAL: vitals reviewed and listed above, alert, oriented, appears well hydrated and in no acute distress  HEENT: atraumatic, conjunttiva clear, no obvious abnormalities on inspection of external nose and ears, normal appearance of ear canals and TMs, thick nasal congestion, mild post oropharyngeal erythema with PND, no tonsillar edema or exudate, no sinus TTP  NECK: no obvious masses on inspection  LUNGS: clear to auscultation bilaterally, no wheezes, rales or rhonchi, good air movement  CV: HRRR, no peripheral edema  MS: moves all extremities without noticeable abnormality  PSYCH: pleasant and cooperative, no obvious depression or anxiety  ASSESSMENT AND PLAN:  Discussed the following assessment and plan:  Acute maxillary sinusitis, recurrence not specified  -Suspected sinusitis -Discussed treatment options and risks -She opted for an antibiotic, doxycycline -of course, we advised to return or notify a doctor immediately if symptoms worsen or persist or new concerns arise.    Patient Instructions  Take the antibiotic as prescribed   INSTRUCTIONS FOR UPPER RESPIRATORY INFECTION:  -plenty of rest and fluids  -nasal saline wash 2-3 times daily (use prepackaged nasal saline or bottled/distilled water if making your own)   -can use AFRIN nasal spray for drainage and nasal congestion - but do NOT use longer then 3-4 days  -can use tylenol (in no history of liver disease) or ibuprofen (if no history of kidney disease, bowel bleeding or significant heart disease) as directed for aches and sorethroat  -in the winter time, using a humidifier at night is helpful (please follow cleaning instructions)  -if you are taking a cough medication - use only as directed, may also try a teaspoon of honey to coat the throat and throat lozenges. If given a cough medication with  codeine or hydrocodone or other narcotic please be advised that this contains a strong and  potentially addicting medication. Please follow instructions carefully, take as little as possible and only use AS NEEDED for severe cough. Discuss potential side effects with your pharmacy. Please do not drive or operate machinery while taking these types of medications. Please do not take other sedating medications, drugs or alcohol while taking this medication without discussing with your doctor.  -for sore throat, salt water gargles can help  -follow up if you have fevers, facial pain, tooth pain, difficulty breathing or are worsening or symptoms persist longer then expected  Upper Respiratory Infection, Adult An upper respiratory infection (URI)  is also known as the common cold. It is often caused by a type of germ (virus). Colds are easily spread (contagious). You can pass it to others by kissing, coughing, sneezing, or drinking out of the same glass. Usually, you get better in 1 to 3  weeks.  However, the cough can last for even longer. HOME CARE   Only take medicine as told by your doctor. Follow instructions provided above.  Drink enough water and fluids to keep your pee (urine) clear or pale yellow.  Get plenty of rest.  Return to work when your temperature is < 100 for 24 hours or as told by your doctor. You may use a face mask and wash your hands to stop your cold from spreading. GET HELP RIGHT AWAY IF:   After the first few days, you feel you are getting worse.  You have questions about your medicine.  You have chills, shortness of breath, or red spit (mucus).  You have pain in the face for more then 1-2 days, especially when you bend forward.  You have a fever, puffy (swollen) neck, pain when you swallow, or white spots in the back of your throat.  You have a bad headache, ear pain, sinus pain, or chest pain.  You have a high-pitched whistling sound when you breathe in and out  (wheezing).  You cough up blood.  You have sore muscles or a stiff neck. MAKE SURE YOU:   Understand these instructions.  Will watch your condition.  Will get help right away if you are not doing well or get worse. Document Released: 10/25/2007 Document Revised: 07/31/2011 Document Reviewed: 08/13/2013 [facility] Patient Information 2015 [place], [place]. This information is not intended to replace advice given to you by your health care provider. Make sure you discuss any questions you have with your health care provider.    [name], DO

## 2017-06-18 NOTE — Patient Instructions (Signed)
Take the antibiotic as prescribed   INSTRUCTIONS FOR UPPER RESPIRATORY INFECTION:  -plenty of rest and fluids  -nasal saline wash 2-3 times daily (use prepackaged nasal saline or bottled/distilled water if making your own)   -can use AFRIN nasal spray for drainage and nasal congestion - but do NOT use longer then 3-4 days  -can use tylenol (in no history of liver disease) or ibuprofen (if no history of kidney disease, bowel bleeding or significant heart disease) as directed for aches and sorethroat  -in the winter time, using a humidifier at night is helpful (please follow cleaning instructions)  -if you are taking a cough medication - use only as directed, may also try a teaspoon of honey to coat the throat and throat lozenges. If given a cough medication with codeine or hydrocodone or other narcotic please be advised that this contains a strong and  potentially addicting medication. Please follow instructions carefully, take as little as possible and only use AS NEEDED for severe cough. Discuss potential side effects with your pharmacy. Please do not drive or operate machinery while taking these types of medications. Please do not take other sedating medications, drugs or alcohol while taking this medication without discussing with your doctor.  -for sore throat, salt water gargles can help  -follow up if you have fevers, facial pain, tooth pain, difficulty breathing or are worsening or symptoms persist longer then expected  Upper Respiratory Infection, Adult An upper respiratory infection (URI) is also known as the common cold. It is often caused by a type of germ (virus). Colds are easily spread (contagious). You can pass it to others by kissing, coughing, sneezing, or drinking out of the same glass. Usually, you get better in 1 to 3  weeks.  However, the cough can last for even longer. HOME CARE   Only take medicine as told by your doctor. Follow instructions provided above.  Drink  enough water and fluids to keep your pee (urine) clear or pale yellow.  Get plenty of rest.  Return to work when your temperature is < 100 for 24 hours or as told by your doctor. You may use a face mask and wash your hands to stop your cold from spreading. GET HELP RIGHT AWAY IF:   After the first few days, you feel you are getting worse.  You have questions about your medicine.  You have chills, shortness of breath, or red spit (mucus).  You have pain in the face for more then 1-2 days, especially when you bend forward.  You have a fever, puffy (swollen) neck, pain when you swallow, or white spots in the back of your throat.  You have a bad headache, ear pain, sinus pain, or chest pain.  You have a high-pitched whistling sound when you breathe in and out (wheezing).  You cough up blood.  You have sore muscles or a stiff neck. MAKE SURE YOU:   Understand these instructions.  Will watch your condition.  Will get help right away if you are not doing well or get worse. Document Released: 10/25/2007 Document Revised: 07/31/2011 Document Reviewed: 08/13/2013 [facility] Patient Information 2015 [place], [place]. This information is not intended to replace advice given to you by your health care provider. Make sure you discuss any questions you have with your health care provider.

## 2017-07-08 NOTE — Progress Notes (Signed)
HPI:  Crystal Patrick is a pleasant 72 y.o. here for follow up. Chronic medical problems summarized below were reviewed for changes and stability and were updated as needed below. These issues and their treatment remain stable for the most part.  Reports she is doing well for the most part.  She was treated for sinus infection a few weeks ago.  She reports most the symptoms are much better, but she still has some stuffiness and pressure on the right side of her nose.  Denies any pain, fevers, malaise.   She has some allergy issues on and off. She has a somewhat higher score on her PHQ 9, however she reports her mood is stable and she is seeing her psychiatrist tomorrow.  She denies any thoughts of harm to herself or others.Denies CP, SOB, DOE, treatment intolerance or new symptoms.  Due for AWV, mammo  HLD/Overweight: -meds: fenofibrate, reported allergy to statins -she declined further testing - she does not want to do any further additional treatment for this  HTN: -meds: valsartan-hctz (80-12.5) -saw Dr. Juleen China in the past -lots of stress - took care of twin grandchildren (5yo)  GERD/Dysphagia/constipation - IBS: -on PPI, was seeing GI, on nexium (insurance won't cover but she prefers to buy OTC) -hx esophageal stricture and hiatal hernia -normal EGD 07/2014 with Dr. Fuller Plan -reports: stable, reports worse if uses low-dose PPI  Depression/Anxiety/Insomnia: -managed by psychiatrist - [name] -meds: buspar, prozac, ambien  Osteoporosis: -did not tolerate bisphosphonate's -did injectable for 2 years -last DEXA remotely -she does not want to re-assess as does not want to take any further medications for this   ROS: See pertinent positives and negatives per HPI.  Past Medical History:  Diagnosis Date  . Anxiety and depression    sees psychiatrist for this  . Arthritis   . Chicken pox   . GERD (gastroesophageal reflux disease)    with hiatal hernia, esophageal  stricture  . Hiatal hernia   . Hx of osteoporosis 03/17/2016   -did not tol bisphosphonates -did injections -does not wish to re-assess or repeat DEXA -doing walking and Vit D  . Hyperlipidemia    has refused treatment or further evaluation other then lifestyle changes  . Hypertension   . Liver failure ([place])    due to Baycol  . Overactive bladder 06/02/2013  . Peptic ulcer   . Seasonal allergies   . Squamous cell carcinoma   . Status post dilation of esophageal narrowing     Past Surgical History:  Procedure Laterality Date  . APPENDECTOMY  1964  . BUNIONECTOMY Bilateral     1 foot x 2,   . NASAL/SINUS ENDOSCOPY     x 3  . TONSILLECTOMY AND ADENOIDECTOMY      Family History  Problem Relation Age of Onset  . Throat cancer Father   . Heart disease Father   . Thyroid cancer Sister   . Ovarian cancer Paternal Aunt   . Prostate cancer Brother   . Prostate cancer Maternal Uncle        x 2  . Bladder Cancer Maternal Uncle   . Diabetes Daughter   . Dementia Mother     Social History   Socioeconomic History  . Marital status: Married    Spouse name: None  . Number of children: 3  . Years of education: None  . Highest education level: None  Social Needs  . Financial resource strain: None  . Food insecurity - worry: None  .  Food insecurity - inability: None  . Transportation needs - medical: None  . Transportation needs - non-medical: None  Occupational History  . Occupation: home maker  Tobacco Use  . Smoking status: Never Smoker  . Smokeless tobacco: Never Used  Substance and Sexual Activity  . Alcohol use: Yes    Alcohol/week: 0.0 oz    Comment: one drink a day  . Drug use: No  . Sexual activity: None  Other Topics Concern  . 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  BusPIRone HCl (BUSPAR PO), Take 20 mg by mouth 2 (two) times daily., Disp: , Rfl:  .  Cholecalciferol (VITAMIN D3) 2000 UNITS TABS, Take 2,000 Units by mouth daily., Disp: , Rfl:  .  DETROL LA 4 MG 24 hr capsule, TAKE 1 CAPSULE DAILY (NEEDS TO SCHEDULE PHYSICAL EXAM), Disp: 90 capsule, Rfl: 1 .  Esomeprazole Magnesium (NEXIUM PO), Take 40 mg by mouth daily. , Disp: , Rfl:  .  fenofibrate 160 MG tablet, TAKE 1 TABLET DAILY (NEEDS TO SCHEDULE PHYSICAL EXAM), Disp: 90 tablet, Rfl: 1 .  FLUoxetine (PROZAC) 40 MG capsule, 20 mg. Take 2 tablets by mouth once daily, Disp: , Rfl:  .  magnesium gluconate (MAGONATE) 500 MG tablet, Take 500 mg by mouth daily., Disp: , Rfl:  .  Potassium Gluconate 595 MG CAPS, Take 595 mg by mouth daily., Disp: , Rfl:  .  valsartan-hydrochlorothiazide (DIOVAN-HCT) 80-12.5 MG tablet, TAKE 1 TABLET DAILY (NEEDS TO SCHEDULE PHYSICAL EXAM), Disp: 90 tablet, Rfl: 1 .  zolpidem (AMBIEN) 10 MG tablet, Take 10 mg by mouth at bedtime as needed for sleep., Disp: , Rfl:   EXAM:  Vitals:   07/10/17 0936  BP: 112/78  Pulse: 64  Temp: 98.2 F (36.8 C)    Body mass index is 29.45 kg/m.  GENERAL: vitals reviewed and listed above, alert, oriented, appears well hydrated and in no acute distress  HEENT: atraumatic, conjunttiva clear, no obvious abnormalities on inspection of external nose and ears, normal appearance of ear canals and TMs, clear nasal congestion, mildly boggy, pale turbinates, mild post oropharyngeal erythema with PND, no tonsillar edema or exudate, no sinus TTP  NECK: no obvious masses on inspection  LUNGS: clear to auscultation bilaterally, no wheezes, rales or rhonchi, good air movement  CV: HRRR, no peripheral edema  MS: moves all extremities without noticeable abnormality  PSYCH: pleasant and cooperative, no obvious depression or anxiety  ASSESSMENT AND PLAN:  Discussed the following assessment  and plan:  Sinus congestion  Seasonal allergies  Essential hypertension  Pure hypercholesterolemia  Recurrent major depressive disorder, in partial remission (HCC)  BMI 29.0-29.9,adult  -We will try Flonase and nasal saline for the sinus congestion, no signs of infection or symptoms to suggest bacterial infection.  Did offer sinus CT, declined.  Did advise ear nose and throat evaluation if symptoms persist. -She sees her psychiatrist tomorrow about her depression -We will do fasting labs at her next visit with her annual wellness visit -Lifestyle recommendations -Again let her know about the multiple blood pressure medication recalls and advised her to check with her pharmacy, or offered to change her  medications -she reported she is not worried about this and declined to change medicines -Advised to schedule mammogram, she agrees to schedule -Patient advised to return or notify a doctor immediately if symptoms worsen or persist or new concerns arise.  Patient Instructions  BEFORE YOU LEAVE: -follow up: Annual wellness visit with Manuela Schwartz and follow-up with Dr. Maudie Mercury in 3 months, please try to do a morning appointment and advised her to come fasting if possible for labs that day  Start Flonase 2 sprays each nostril daily for 1 month.  If you continue it after that, please decrease to 1 spray each nostril daily.  Please use the Nettie pot or saline.  Follow cleaning instructions and use carefully.  If you have persistent sinus issues, please call the number provided to schedule an Ear, nose and throat appointment.   We recommend the following healthy lifestyle for LIFE: 1) Small portions. But, make sure to get regular (at least 3 per day), healthy meals and small healthy snacks if needed.  2) Eat a healthy clean diet.   TRY TO EAT: -at least 5-7 servings of low sugar, colorful, and nutrient rich vegetables per day (not corn, potatoes or bananas.) -berries are the best choice if you  wish to eat fruit (only eat small amounts if trying to reduce weight)  -lean meets (fish, white meat of chicken or [place]) -vegan proteins for some meals - beans or tofu, whole grains, nuts and seeds -Replace bad fats with good fats - good fats include: fish, nuts and seeds, canola oil, olive oil -small amounts of low fat or non fat dairy -small amounts of100 % whole grains - check the lables -drink plenty of water  AVOID: -SUGAR, sweets, anything with added sugar, corn syrup or sweeteners - must read labels as even foods advertised as "healthy" often are loaded with sugar -if you must have a sweetener, small amounts of stevia may be best -sweetened beverages and artificially sweetened beverages -simple starches (rice, bread, potatoes, pasta, chips, etc - small amounts of 100% whole grains are ok) -red meat, pork, butter -fried foods, fast food, processed food, excessive dairy, eggs and coconut.  3)Get at least 150 minutes of sweaty aerobic exercise per week.  4)Reduce stress - consider counseling, meditation and relaxation to balance other aspects of your life.     [name], DO

## 2017-07-10 ENCOUNTER — Ambulatory Visit (INDEPENDENT_AMBULATORY_CARE_PROVIDER_SITE_OTHER): Payer: Medicare Other | Admitting: Family Medicine

## 2017-07-10 ENCOUNTER — Encounter: Payer: Self-pay | Admitting: Family Medicine

## 2017-07-10 VITALS — BP 112/78 | HR 64 | Temp 98.2°F | Ht 62.0 in | Wt 161.0 lb

## 2017-07-10 DIAGNOSIS — J302 Other seasonal allergic rhinitis: Secondary | ICD-10-CM | POA: Diagnosis not present

## 2017-07-10 DIAGNOSIS — F3341 Major depressive disorder, recurrent, in partial remission: Secondary | ICD-10-CM | POA: Diagnosis not present

## 2017-07-10 DIAGNOSIS — R0981 Nasal congestion: Secondary | ICD-10-CM | POA: Diagnosis not present

## 2017-07-10 DIAGNOSIS — Z6829 Body mass index (BMI) 29.0-29.9, adult: Secondary | ICD-10-CM | POA: Diagnosis not present

## 2017-07-10 DIAGNOSIS — I10 Essential (primary) hypertension: Secondary | ICD-10-CM

## 2017-07-10 DIAGNOSIS — E7800 Pure hypercholesterolemia, unspecified: Secondary | ICD-10-CM

## 2017-07-10 NOTE — Patient Instructions (Addendum)
BEFORE YOU LEAVE: -follow up: Annual wellness visit with Crystal Patrick and follow-up with Dr. Maudie Mercury in 3 months, please try to do a morning appointment and advised her to come fasting if possible for labs that day  Start Flonase 2 sprays each nostril daily for 1 month.  If you continue it after that, please decrease to 1 spray each nostril daily.  Please use the Nettie pot or saline.  Follow cleaning instructions and use carefully.  If you have persistent sinus issues, please call the number provided to schedule an Ear, nose and throat appointment.   We recommend the following healthy lifestyle for LIFE: 1) Small portions. But, make sure to get regular (at least 3 per day), healthy meals and small healthy snacks if needed.  2) Eat a healthy clean diet.   TRY TO EAT: -at least 5-7 servings of low sugar, colorful, and nutrient rich vegetables per day (not corn, potatoes or bananas.) -berries are the best choice if you wish to eat fruit (only eat small amounts if trying to reduce weight)  -lean meets (fish, white meat of chicken or [place]) -vegan proteins for some meals - beans or tofu, whole grains, nuts and seeds -Replace bad fats with good fats - good fats include: fish, nuts and seeds, canola oil, olive oil -small amounts of low fat or non fat dairy -small amounts of100 % whole grains - check the lables -drink plenty of water  AVOID: -SUGAR, sweets, anything with added sugar, corn syrup or sweeteners - must read labels as even foods advertised as "healthy" often are loaded with sugar -if you must have a sweetener, small amounts of stevia may be best -sweetened beverages and artificially sweetened beverages -simple starches (rice, bread, potatoes, pasta, chips, etc - small amounts of 100% whole grains are ok) -red meat, pork, butter -fried foods, fast food, processed food, excessive dairy, eggs and coconut.  3)Get at least 150 minutes of sweaty aerobic exercise per week.  4)Reduce stress -  consider counseling, meditation and relaxation to balance other aspects of your life.

## 2017-07-11 DIAGNOSIS — F331 Major depressive disorder, recurrent, moderate: Secondary | ICD-10-CM | POA: Diagnosis not present

## 2017-07-13 ENCOUNTER — Ambulatory Visit: Payer: Medicare Other

## 2017-08-02 ENCOUNTER — Ambulatory Visit: Payer: Medicare Other | Admitting: Family Medicine

## 2017-08-09 ENCOUNTER — Other Ambulatory Visit: Payer: Self-pay | Admitting: Family Medicine

## 2017-09-19 DIAGNOSIS — L570 Actinic keratosis: Secondary | ICD-10-CM | POA: Diagnosis not present

## 2017-09-29 ENCOUNTER — Other Ambulatory Visit: Payer: Self-pay | Admitting: Family Medicine

## 2017-10-01 MED ORDER — TOLTERODINE TARTRATE ER 4 MG PO CP24: ORAL_CAPSULE | ORAL | 0 refills | Status: DC

## 2017-10-01 NOTE — Addendum Note (Signed)
Addended by: Agnes Lawrence on: 10/01/2017 10:07 AM   Modules accepted: Orders

## 2017-10-01 NOTE — Telephone Encounter (Signed)
Rx resent.

## 2017-10-07 NOTE — Progress Notes (Signed)
HPI:  Using dictation device. Unfortunately this device frequently misinterprets words/phrases.  Crystal Patrick is a pleasant 72 y.o. here for follow up. Chronic medical problems summarized below were reviewed for changes.  She has lots of stress with taking care of her 43 year old twin grandchildren and dealing with her husband whom is an alcoholic.  She feels safe, but has a lot of stress.  She is seeing her psychiatrist about this.  She refuses a mammogram, annual wellness visit with her health coach and several preventive care measures.  Reports she is walking her dog on a regular basis so does get some exercise.  CP, SOB, DOE, treatment intolerance or new symptoms.  See PHQ 9.  Sees psychiatrist for management. Due for mammogram, labs, AWV - refuses mammo and AWV with healthcoach. Agrees to awv with me at next visit.  HLD/Overweight: -meds: fenofibrate, reported allergy to statins -she declined further testing - she does not want to do any further additional treatment for this  HTN: -meds: valsartan-hctz (80-12.5) -saw Dr. Juleen Patrick in the past -lots of stress - took care of twin grandchildren (10yo in may 2019)  GERD/Dysphagia/constipation - IBS: -on PPI, was seeing GI, on nexium (insurance won't cover but she prefers to buy OTC) -hx esophageal stricture and hiatal hernia -normal EGD 07/2014 with Dr. Fuller Patrick -reports: stable, reports worse if uses low-dose PPI  Depression/Anxiety/Insomnia: -managed by psychiatrist - Crystal Patrick -meds: buspar, prozac, ambien  Osteoporosis: -did not tolerate bisphosphonate's -did injectable for 2 years -last DEXA remotely -she does not want to re-assess as does not want to take any further medications for this    ROS: See pertinent positives and negatives per HPI.  Past Medical History:  Diagnosis Date  . Anxiety and depression    sees psychiatrist for this  . Arthritis   . Chicken pox   . GERD (gastroesophageal reflux disease)    with hiatal hernia, esophageal stricture  . Hiatal hernia   . Hx of osteoporosis 03/17/2016   -did not tol bisphosphonates -did injections -does not wish to re-assess or repeat DEXA -doing walking and Vit D  . Hyperlipidemia    has refused treatment or further evaluation other then lifestyle changes  . Hypertension   . Liver failure ([place])    due to Baycol  . Overactive bladder 06/02/2013  . Peptic ulcer   . Seasonal allergies   . Squamous cell carcinoma   . Status post dilation of esophageal narrowing     Past Surgical History:  Procedure Laterality Date  . APPENDECTOMY  1964  . BUNIONECTOMY Bilateral     1 foot x 2,   . NASAL/SINUS ENDOSCOPY     x 3  . TONSILLECTOMY AND ADENOIDECTOMY      Family History  Problem Relation Age of Onset  . Throat cancer Father   . Heart disease Father   . Thyroid cancer Sister   . Ovarian cancer Paternal Aunt   . Prostate cancer Brother   . Prostate cancer Maternal Uncle        x 2  . Bladder Cancer Maternal Uncle   . Diabetes Daughter   . Dementia Mother     SOCIAL HX: See HPI   Current Outpatient Medications:  .  BusPIRone HCl (BUSPAR PO), Take 20 mg by mouth 2 (two) times daily., Disp: , Rfl:  .  Cholecalciferol (VITAMIN D3) 2000 UNITS TABS, Take 2,000 Units by mouth daily., Disp: , Rfl:  .  Esomeprazole Magnesium (NEXIUM PO), Take 40  mg by mouth daily. , Disp: , Rfl:  .  fenofibrate 160 MG tablet, TAKE 1 TABLET DAILY (NEEDS TO SCHEDULE PHYSICAL EXAM), Disp: 90 tablet, Rfl: 0 .  FLUoxetine (PROZAC) 40 MG capsule, 20 mg. Take 2 tablets by mouth once daily, Disp: , Rfl:  .  magnesium gluconate (MAGONATE) 500 MG tablet, Take 500 mg by mouth daily., Disp: , Rfl:  .  Potassium Gluconate 595 MG CAPS, Take 595 mg by mouth daily., Disp: , Rfl:  .  tolterodine (DETROL LA) 4 MG 24 hr capsule, TAKE 1 CAPSULE DAILY (NEEDS TO SCHEDULE PHYSICAL EXAM), Disp: 90 capsule, Rfl: 0 .  valsartan-hydrochlorothiazide (DIOVAN-HCT) 80-12.5 MG tablet, TAKE  1 TABLET DAILY (NEEDS TO SCHEDULE PHYSICAL EXAM), Disp: 90 tablet, Rfl: 1 .  zolpidem (AMBIEN) 10 MG tablet, Take 10 mg by mouth at bedtime as needed for sleep., Disp: , Rfl:   EXAM:  Vitals:   10/08/17 0921  BP: 116/80  Pulse: 75  Temp: 98.4 F (36.9 C)    Body mass index is 30 kg/m.  GENERAL: vitals reviewed and listed above, alert, oriented, appears well hydrated and in no acute distress  HEENT: atraumatic, conjunttiva clear, no obvious abnormalities on inspection of external nose and ears  NECK: no obvious masses on inspection  LUNGS: clear to auscultation bilaterally, no wheezes, rales or rhonchi, good air movement  CV: HRRR, no peripheral edema  MS: moves all extremities without noticeable abnormality  PSYCH: pleasant and cooperative, no obvious depression or anxiety  ASSESSMENT AND Patrick:  Discussed the following assessment and Patrick:  Essential hypertension  Pure hypercholesterolemia  Recurrent major depressive disorder, in partial remission (HCC)  Obesity (BMI 30-39.9)  -Agrees to labs today, she has declined further cholesterol testing, so we will not check this -Advised breast cancer screening, she refused, did advise that she consider,   she agrees to self breast exams  -continue current medications  -continue care with psychiatrist for management of stress and depression -Advised annual wellness visit, she refused to do this with her health coach, but agrees to see me for this at her next visit   Patient Instructions  BEFORE YOU LEAVE: -labs -follow up: AWV with Dr. Maudie Patrick in 3-4 months  We have ordered labs or studies at this visit. It can take up to 1-2 weeks for results and processing. IF results require follow up or explanation, we will call you with instructions. Clinically stable results will be released to your [facility]. If you have not heard from [place] or cannot find your results in [facility] in 2 weeks please contact our office at [phone].  If  you are not yet signed up for [facility], please consider signing up.  Please consider getting your mammogram if you can.        Crystal Kern, DO

## 2017-10-08 ENCOUNTER — Ambulatory Visit (INDEPENDENT_AMBULATORY_CARE_PROVIDER_SITE_OTHER): Payer: Medicare Other | Admitting: Family Medicine

## 2017-10-08 ENCOUNTER — Encounter: Payer: Self-pay | Admitting: Family Medicine

## 2017-10-08 VITALS — BP 116/80 | HR 75 | Temp 98.4°F | Ht 62.0 in | Wt 164.0 lb

## 2017-10-08 DIAGNOSIS — E7800 Pure hypercholesterolemia, unspecified: Secondary | ICD-10-CM | POA: Diagnosis not present

## 2017-10-08 DIAGNOSIS — E669 Obesity, unspecified: Secondary | ICD-10-CM | POA: Diagnosis not present

## 2017-10-08 DIAGNOSIS — I10 Essential (primary) hypertension: Secondary | ICD-10-CM

## 2017-10-08 DIAGNOSIS — F3341 Major depressive disorder, recurrent, in partial remission: Secondary | ICD-10-CM | POA: Diagnosis not present

## 2017-10-08 LAB — BASIC METABOLIC PANEL
BUN: 17 mg/dL (ref 6–23)
CHLORIDE: 101 meq/L (ref 96–112)
CO2: 28 meq/L (ref 19–32)
CREATININE: 0.85 mg/dL (ref 0.40–1.20)
Calcium: 9.1 mg/dL (ref 8.4–10.5)
GFR: 70.24 mL/min (ref >60.00)
Glucose, Bld: 97 mg/dL (ref 70–99)
Potassium: 4 mEq/L (ref 3.5–5.1)
Sodium: 138 mEq/L (ref 135–145)

## 2017-10-08 LAB — CBC
HEMATOCRIT: 41.6 % (ref 36.0–46.0)
HEMOGLOBIN: 13.9 g/dL (ref 12.0–15.0)
MCHC: 33.3 g/dL (ref 30.0–36.0)
MCV: 91.3 fl (ref 78.0–100.0)
Platelets: 353 10*3/uL (ref 150.0–400.0)
RBC: 4.56 Mil/uL (ref 3.87–5.11)
RDW: 13.5 % (ref 11.5–15.5)
WBC: 4.8 10*3/uL (ref 4.0–10.5)

## 2017-10-08 NOTE — Patient Instructions (Signed)
BEFORE YOU LEAVE: -labs -follow up: AWV with Dr. Maudie Mercury in 3-4 months  We have ordered labs or studies at this visit. It can take up to 1-2 weeks for results and processing. IF results require follow up or explanation, we will call you with instructions. Clinically stable results will be released to your [facility]. If you have not heard from [place] or cannot find your results in [facility] in 2 weeks please contact our office at [phone].  If you are not yet signed up for [facility], please consider signing up.  Please consider getting your mammogram if you can.

## 2017-12-17 DIAGNOSIS — F331 Major depressive disorder, recurrent, moderate: Secondary | ICD-10-CM | POA: Diagnosis not present

## 2017-12-30 ENCOUNTER — Other Ambulatory Visit: Payer: Self-pay | Admitting: Family Medicine

## 2017-12-31 DIAGNOSIS — F331 Major depressive disorder, recurrent, moderate: Secondary | ICD-10-CM | POA: Diagnosis not present

## 2018-01-22 DIAGNOSIS — F331 Major depressive disorder, recurrent, moderate: Secondary | ICD-10-CM | POA: Diagnosis not present

## 2018-02-04 NOTE — Progress Notes (Signed)
Medicare Annual Preventive Care Visit  (initial annual wellness or annual wellness exam)  Concerns and/or follow up today:  Crystal Patrick is a pleasant 72 y.o. here for follow up. Chronic medical problems summarized below were reviewed for changes and remain stable for the most part, except for stress. She sees Doctor [employer] (psychiatry) and Lattie Haw for counseling. Reports has had some medication changes recently - wellbutrin added, ambien stopped and Restoril started. She has a tough situation at home with a husband with alcohol issues, daughter lost her job and she is caring for twin grandchildren 72 yo. Despite all of this, she is managing. She sees her psychiatry tomorrow. Has refused mammograms in the past. Continues to refuse, does self breast exams. Due for labs and flu shot (has declined and today declines further cholesterol testing) Declines further bone density testing. HLD/Overweight: -meds: fenofibrate, reported allergy to statins -she declined further testing - she does not want to do any further additional treatment for this  HTN: -meds: valsartan-hctz (80-12.5) -saw Dr. Phineas Real the past -lots of stress - took care of twin grandchildren (10yo in may 2019)  GERD/Dysphagia/constipation - IBS: -on PPI, was seeing GI, on nexium (insurance won't cover but she prefers to buy OTC) -hx esophageal stricture and hiatal hernia -normal EGD 07/2014 with Dr. Fuller Plan -reports: stable, reports worse if uses low-dose PPI  Depression/Anxiety/Insomnia: -managed by psychiatrist - [name] -meds: buspar, prozac, ambien  Osteoporosis: -did not tolerate bisphosphonate's -did injectable for 2 years -last DEXA remotely -she does not want to re-assess as does not want to take any further medications for this  See HM section in Epic for other details of completed HM. See scanned documentation under Media Tab for further documentation HPI, health risk assessment. See Media Tab and  Care Teams sections in Epic for other providers.  ROS: negative for report of fevers, unintentional weight loss, vision changes, vision loss, hearing loss or change, chest pain, sob, hemoptysis, melena, hematochezia, hematuria, genital discharge or lesions, falls, bleeding or bruising, loc, thoughts of suicide or self harm, memory loss  1.) Patient-completed health risk assessment  - completed and reviewed, see scanned documentation  2.) Review of Medical History: -PMH, PSH, Family History and current specialty and care providers reviewed and updated and listed below  - see scanned in document in chart and below  Past Medical History:  Diagnosis Date  . Anxiety and depression    sees psychiatrist for this  . Arthritis   . Chicken pox   . GERD (gastroesophageal reflux disease)    with hiatal hernia, esophageal stricture  . Hiatal hernia   . Hx of osteoporosis 03/17/2016   -did not tol bisphosphonates -did injections -does not wish to re-assess or repeat DEXA -doing walking and Vit D  . Hyperlipidemia    has refused treatment or further evaluation other then lifestyle changes  . Hypertension   . Liver failure ([place])    due to Baycol  . Overactive bladder 06/02/2013  . Peptic ulcer   . Seasonal allergies   . Squamous cell carcinoma   . Status post dilation of esophageal narrowing     Past Surgical History:  Procedure Laterality Date  . APPENDECTOMY  1964  . BUNIONECTOMY Bilateral     1 foot x 2,   . NASAL/SINUS ENDOSCOPY     x 3  . TONSILLECTOMY AND ADENOIDECTOMY      Social History   Socioeconomic History  . Marital status: Married    Spouse name:  Not on file  . Number of children: 3  . Years of education: Not on file  . Highest education level: Not on file  Occupational History  . Occupation: home maker  Social Needs  . Financial resource strain: Not on file  . Food insecurity:    Worry: Not on file    Inability: Not on file  . Transportation needs:    Medical:  Not on file    Non-medical: Not on file  Tobacco Use  . Smoking status: Never Smoker  . Smokeless tobacco: Never Used  Substance and Sexual Activity  . Alcohol use: Yes    Alcohol/week: 0.0 standard drinks    Comment: one drink a day  . Drug use: No  . Sexual activity: Not on file  Lifestyle  . Physical activity:    Days per week: Not on file    Minutes per session: Not on file  . Stress: Not on file  Relationships  . Social connections:    Talks on phone: Not on file    Gets together: Not on file    Attends religious service: Not on file    Active member of club or organization: Not on file    Attends meetings of clubs or organizations: Not on file    Relationship status: Not on file  . Intimate partner violence:    Fear of current or ex partner: Not on file    Emotionally abused: Not on file    Physically abused: Not on file    Forced sexual activity: Not on file  Other Topics Concern  . Not on file  Social History Narrative   Work or School: retired Web designer Situation: lives with husband, daughter and 2 children      Spiritual Beliefs: Christian, Catholic      Lifestyle: no regular exercise; diet is good              Family History  Problem Relation Age of Onset  . Throat cancer Father   . Heart disease Father   . Thyroid cancer Sister   . Ovarian cancer Paternal Aunt   . Prostate cancer Brother   . Prostate cancer Maternal Uncle        x 2  . Bladder Cancer Maternal Uncle   . Diabetes Daughter   . Dementia Mother     Current Outpatient Medications on File Prior to Visit  Medication Sig Dispense Refill  . buPROPion (WELLBUTRIN XL) 300 MG 24 hr tablet Take 300 mg by mouth daily.    . BusPIRone HCl (BUSPAR PO) Take 20 mg by mouth 2 (two) times daily.    . Cholecalciferol (VITAMIN D3) 2000 UNITS TABS Take 2,000 Units by mouth daily.    . Esomeprazole Magnesium (NEXIUM PO) Take 40 mg by mouth daily.     . fenofibrate 160 MG tablet TAKE 1  TABLET DAILY (NEEDS TO SCHEDULE PHYSICAL EXAM) 90 tablet 0  . FLUoxetine (PROZAC) 40 MG capsule 20 mg. Take 2 tablets by mouth once daily    . magnesium gluconate (MAGONATE) 500 MG tablet Take 500 mg by mouth daily.    . Potassium Gluconate 595 MG CAPS Take 595 mg by mouth daily.    . temazepam (RESTORIL) 30 MG capsule Take 30 mg by mouth at bedtime as needed for sleep.    Marland Kitchen tolterodine (DETROL LA) 4 MG 24 hr capsule TAKE 1 CAPSULE DAILY (NEED TO SCHEDULE PHYSICAL EXAM) 90 capsule 0  .  valsartan-hydrochlorothiazide (DIOVAN-HCT) 80-12.5 MG tablet TAKE 1 TABLET DAILY (NEEDS TO SCHEDULE PHYSICAL EXAM) 90 tablet 1   No current facility-administered medications on file prior to visit.      3.) Review of functional ability and level of safety:  Any difficulty hearing?  See scanned documentation  History of falling?  See scanned documentation  Any trouble with IADLs - using a phone, using transportation, grocery shopping, preparing meals, doing housework, doing laundry, taking medications and managing money?  See scanned documentation  Advance Directives?  Discussed briefly and offered more resources and detailed discussion with our trained staff.   See summary of recommendations in Patient Instructions below.  4.) Physical Exam Vitals:   02/05/18 0812 02/05/18 0815  BP: 118/78 140/80  Pulse: 81   Temp: 98.5 F (36.9 C)    Estimated body mass index is 28.97 kg/m as calculated from the following:   Height as of this encounter: 5\' 2"  (1.575 m).   Weight as of this encounter: 158 lb 6.4 oz (71.8 kg).  EKG (optional): deferred  General: alert, appear well hydrated and in no acute distress  HEENT: visual acuity grossly intact  CV: HRRR  Lungs: CTA bilaterally  Psych: pleasant and cooperative, no obvious depression or anxiety  Cognitive function grossly intact  See patient instructions for recommendations.  Education and counseling regarding the above review of health  provided with a plan for the following: -see scanned patient completed form for further details -fall prevention strategies discussed  -healthy lifestyle discussed -importance and resources for completing advanced directives discussed -see patient instructions below for any other recommendations provided  4)The following written screening schedule of preventive measures were reviewed with assessment and plan made per below, orders and patient instructions:      AAA screening done if applicable     Alcohol screening done     Obesity Screening and counseling done     STI screening (Hep C if born [birth year]-65) offered and per pt wishes     Tobacco Screening done done       Pneumococcal (PPSV23 -one dose after 64, one before if risk factors), influenza yearly and hepatitis B vaccines (if high risk - end stage renal disease, IV drugs, homosexual men, live in home for mentally retarded, hemophilia receiving factors) ASSESSMENT/PLAN: done       Screening mammograph (yearly if >40) ASSESSMENT/PLAN: refused breast ca screening in the past, advised today       Screening Pap smear/pelvic exam (q2 years) ASSESSMENT/PLAN: n/a, declined      Colorectal cancer screening (FOBT yearly or flex sig q4y or colonoscopy q10y or barium enema q4y) ASSESSMENT/PLAN: utd       Diabetes outpatient self-management training services ASSESSMENT/PLAN: utd or done      Bone mass measurements(covered q2y if indicated - estrogen def, osteoporosis, hyperparathyroid, vertebral abnormalities, osteoporosis or steroids) ASSESSMENT/PLAN: hx osteoporosis, treated and improved, has refused further monitoring or further DEXA scans      Screening for glaucoma(q1y if high risk - diabetes, FH, AA and > 50 or hispanic and > 65) ASSESSMENT/PLAN: utd or advised      Medical nutritional therapy for individuals with diabetes or renal disease ASSESSMENT/PLAN: see orders      Cardiovascular screening blood tests (lipids  q5y) ASSESSMENT/PLAN: see orders and labs      Diabetes screening tests ASSESSMENT/PLAN: see orders and labs   7.) Summary:   Medicare annual wellness visit, subsequent -risk factors and conditions per above assessment were discussed and  treatment, recommendations and referrals were offered per documentation above and orders and patient instructions. -refused mammograms, bone density and lipid check -agreeable to flu shot and other labs -plans to check on coverage for the shingles vaccine and consider -continues to get regular exercise  Hyperlipidemia, unspecified hyperlipidemia type -on fenofibrate, declined further monitoring  Essential hypertension -elevated on arrival, improved on recheck, monitor and cont current treatment -discussed risks with hctz, sees dermatologist for hx SCC skin and prefers to discuss with dermatologist and will let us know if recs to stop  Recurrent major depressive disorder, in partial remission [facility]) -sees psychiatry for managment  Patient Instructions   BEFORE YOU LEAVE: -flu shot -labs -follow up: 3-4 months  Please check on your insurance coverage for the shinrix (shingles) vaccine and let us know if you would like to do this.  We have ordered labs or studies at this visit. It can take up to 1-2 weeks for results and processing. IF results require follow up or explanation, we will call you with instructions. Clinically stable results will be released to your [facility]. If you have not heard from [place] or cannot find your results in [facility] in 2 weeks please contact our office at [phone].  If you are not yet signed up for [facility], please consider signing up.   Ms. Capistran , Thank you for taking time to come for your Medicare Wellness Visit. I appreciate your ongoing commitment to your health goals. Please review the following plan we discussed and let me know if I can assist you in the future.   These are the goals we discussed: Goals    See psychiatry in follow up for the Depression Check on coverage for shingles vaccine Eat a healthy diet and continue walking     This is a list of the screening recommended for you and due dates:  Health Maintenance  Topic Date Due  . Flu Shot  Today  . Mammogram  10/09/2018* Declined  . Tetanus Vaccine  05/22/2018  . Colon Cancer Screening  08/05/2019  . DEXA scan (bone density measurement)  Completed Declined further  .  Hepatitis C: One time screening is recommended by Center for Disease Control  (CDC) for  adults born from [birth year] through 1963.   Completed  . Pneumonia vaccines  Completed  *Topic was postponed. The date shown is not the original due date.           [name], DO

## 2018-02-05 ENCOUNTER — Other Ambulatory Visit: Payer: Self-pay | Admitting: Family Medicine

## 2018-02-05 ENCOUNTER — Ambulatory Visit (INDEPENDENT_AMBULATORY_CARE_PROVIDER_SITE_OTHER): Payer: Medicare Other | Admitting: Family Medicine

## 2018-02-05 ENCOUNTER — Encounter: Payer: Self-pay | Admitting: Family Medicine

## 2018-02-05 VITALS — BP 140/80 | HR 81 | Temp 98.5°F | Ht 62.0 in | Wt 158.4 lb

## 2018-02-05 DIAGNOSIS — R739 Hyperglycemia, unspecified: Secondary | ICD-10-CM

## 2018-02-05 DIAGNOSIS — I10 Essential (primary) hypertension: Secondary | ICD-10-CM | POA: Diagnosis not present

## 2018-02-05 DIAGNOSIS — Z23 Encounter for immunization: Secondary | ICD-10-CM | POA: Diagnosis not present

## 2018-02-05 DIAGNOSIS — E785 Hyperlipidemia, unspecified: Secondary | ICD-10-CM

## 2018-02-05 DIAGNOSIS — Z0000 Encounter for general adult medical examination without abnormal findings: Secondary | ICD-10-CM

## 2018-02-05 DIAGNOSIS — F3341 Major depressive disorder, recurrent, in partial remission: Secondary | ICD-10-CM

## 2018-02-05 LAB — CBC
HEMATOCRIT: 40.8 % (ref 36.0–46.0)
Hemoglobin: 13.9 g/dL (ref 12.0–15.0)
MCHC: 34 g/dL (ref 30.0–36.0)
MCV: 90.9 fl (ref 78.0–100.0)
PLATELETS: 344 10*3/uL (ref 150.0–400.0)
RBC: 4.49 Mil/uL (ref 3.87–5.11)
RDW: 13.4 % (ref 11.5–15.5)
WBC: 4.8 10*3/uL (ref 4.0–10.5)

## 2018-02-05 LAB — BASIC METABOLIC PANEL
BUN: 16 mg/dL (ref 6–23)
CHLORIDE: 100 meq/L (ref 96–112)
CO2: 24 mEq/L (ref 19–32)
CREATININE: 0.84 mg/dL (ref 0.40–1.20)
Calcium: 9.7 mg/dL (ref 8.4–10.5)
GFR: 71.13 mL/min (ref >60.00)
Glucose, Bld: 96 mg/dL (ref 70–99)
POTASSIUM: 3.9 meq/L (ref 3.5–5.1)
SODIUM: 137 meq/L (ref 135–145)

## 2018-02-05 LAB — HEMOGLOBIN A1C: HEMOGLOBIN A1C: 6 % (ref 4.6–6.5)

## 2018-02-05 NOTE — Addendum Note (Signed)
Addended by: Agnes Lawrence on: 02/05/2018 09:02 AM   Modules accepted: Orders

## 2018-02-05 NOTE — Patient Instructions (Signed)
BEFORE YOU LEAVE: -flu shot -labs -follow up: 3-4 months  Please check on your insurance coverage for the shinrix (shingles) vaccine and let us know if you would like to do this.  We have ordered labs or studies at this visit. It can take up to 1-2 weeks for results and processing. IF results require follow up or explanation, we will call you with instructions. Clinically stable results will be released to your [facility]. If you have not heard from [place] or cannot find your results in [facility] in 2 weeks please contact our office at [phone].  If you are not yet signed up for [facility], please consider signing up.   Ms. Hinsley , Thank you for taking time to come for your Medicare Wellness Visit. I appreciate your ongoing commitment to your health goals. Please review the following plan we discussed and let me know if I can assist you in the future.   These are the goals we discussed: Goals   See psychiatry in follow up for the Depression Check on coverage for shingles vaccine Eat a healthy diet and continue walking     This is a list of the screening recommended for you and due dates:  Health Maintenance  Topic Date Due  . Flu Shot  Today  . Mammogram  10/09/2018* Declined  . Tetanus Vaccine  05/22/2018  . Colon Cancer Screening  08/05/2019  . DEXA scan (bone density measurement)  Completed Declined further  .  Hepatitis C: One time screening is recommended by Center for Disease Control  (CDC) for  adults born from [birth year] through 1963.   Completed  . Pneumonia vaccines  Completed  *Topic was postponed. The date shown is not the original due date.

## 2018-02-06 DIAGNOSIS — F331 Major depressive disorder, recurrent, moderate: Secondary | ICD-10-CM | POA: Diagnosis not present

## 2018-03-31 ENCOUNTER — Other Ambulatory Visit: Payer: Self-pay | Admitting: Family Medicine

## 2018-04-24 DIAGNOSIS — F331 Major depressive disorder, recurrent, moderate: Secondary | ICD-10-CM | POA: Diagnosis not present

## 2018-04-29 DIAGNOSIS — D225 Melanocytic nevi of trunk: Secondary | ICD-10-CM | POA: Diagnosis not present

## 2018-04-29 DIAGNOSIS — L821 Other seborrheic keratosis: Secondary | ICD-10-CM | POA: Diagnosis not present

## 2018-04-29 DIAGNOSIS — L570 Actinic keratosis: Secondary | ICD-10-CM | POA: Diagnosis not present

## 2018-04-29 DIAGNOSIS — D1801 Hemangioma of skin and subcutaneous tissue: Secondary | ICD-10-CM | POA: Diagnosis not present

## 2018-04-29 DIAGNOSIS — Z85828 Personal history of other malignant neoplasm of skin: Secondary | ICD-10-CM | POA: Diagnosis not present

## 2018-04-29 DIAGNOSIS — L720 Epidermal cyst: Secondary | ICD-10-CM | POA: Diagnosis not present

## 2018-05-28 DIAGNOSIS — F331 Major depressive disorder, recurrent, moderate: Secondary | ICD-10-CM | POA: Diagnosis not present

## 2018-06-10 ENCOUNTER — Ambulatory Visit: Payer: Medicare Other | Admitting: Family Medicine

## 2018-07-30 ENCOUNTER — Encounter: Payer: Self-pay | Admitting: Family Medicine

## 2018-07-30 ENCOUNTER — Ambulatory Visit (INDEPENDENT_AMBULATORY_CARE_PROVIDER_SITE_OTHER): Payer: Medicare Other | Admitting: Family Medicine

## 2018-07-30 ENCOUNTER — Ambulatory Visit (HOSPITAL_COMMUNITY)
Admission: RE | Admit: 2018-07-30 | Discharge: 2018-07-30 | Disposition: A | Discharge status: Home / Self Care | Payer: Medicare Other | Source: Ambulatory Visit | Attending: Family Medicine | Admitting: Family Medicine

## 2018-07-30 ENCOUNTER — Telehealth: Payer: Self-pay

## 2018-07-30 VITALS — BP 140/80 | HR 84 | Temp 98.7°F | Ht 62.0 in | Wt 150.0 lb

## 2018-07-30 DIAGNOSIS — R1011 Right upper quadrant pain: Secondary | ICD-10-CM

## 2018-07-30 DIAGNOSIS — K8020 Calculus of gallbladder without cholecystitis without obstruction: Secondary | ICD-10-CM | POA: Diagnosis not present

## 2018-07-30 DIAGNOSIS — K810 Acute cholecystitis: Secondary | ICD-10-CM

## 2018-07-30 LAB — COMPREHENSIVE METABOLIC PANEL
ALT: 26 U/L (ref 0–35)
AST: 19 U/L (ref 0–37)
Albumin: 4.8 g/dL (ref 3.5–5.2)
Alkaline Phosphatase: 47 U/L (ref 39–117)
BUN: 19 mg/dL (ref 6–23)
CO2: 26 mEq/L (ref 19–32)
Calcium: 9.8 mg/dL (ref 8.4–10.5)
Chloride: 101 mEq/L (ref 96–112)
Creatinine, Ser: 0.92 mg/dL (ref 0.40–1.20)
GFR: 60.17 mL/min (ref >60.00)
Glucose, Bld: 83 mg/dL (ref 70–99)
Potassium: 4 mEq/L (ref 3.5–5.1)
Sodium: 137 mEq/L (ref 135–145)
Total Bilirubin: 0.6 mg/dL (ref 0.2–1.2)
Total Protein: 7.1 g/dL (ref 6.0–8.3)

## 2018-07-30 LAB — CBC WITH DIFFERENTIAL/PLATELET
Basophils Absolute: 0 10*3/uL (ref 0.0–0.1)
Basophils Relative: 0.6 % (ref 0.0–3.0)
Eosinophils Absolute: 0.1 10*3/uL (ref 0.0–0.7)
Eosinophils Relative: 0.8 % (ref 0.0–5.0)
HCT: 41.7 % (ref 36.0–46.0)
Hemoglobin: 14.2 g/dL (ref 12.0–15.0)
Lymphocytes Relative: 26.4 % (ref 12.0–46.0)
Lymphs Abs: 1.7 10*3/uL (ref 0.7–4.0)
MCHC: 34 g/dL (ref 30.0–36.0)
MCV: 90.7 fl (ref 78.0–100.0)
Monocytes Absolute: 0.6 10*3/uL (ref 0.1–1.0)
Monocytes Relative: 8.9 % (ref 3.0–12.0)
Neutro Abs: 4.1 10*3/uL (ref 1.4–7.7)
Neutrophils Relative %: 63.3 % (ref 43.0–77.0)
Platelets: 376 10*3/uL (ref 150.0–400.0)
RBC: 4.6 Mil/uL (ref 3.87–5.11)
RDW: 13.6 % (ref 11.5–15.5)
WBC: 6.5 10*3/uL (ref 4.0–10.5)

## 2018-07-30 LAB — LIPASE: Lipase: 7 U/L — ABNORMAL LOW (ref 11.0–59.0)

## 2018-07-30 MED ORDER — ONDANSETRON 4 MG PO TBDP
4.0000 mg | ORAL_TABLET | Freq: Once | ORAL | Status: AC
  Administered 2018-07-30: 4 mg via ORAL

## 2018-07-30 MED ORDER — ONDANSETRON 4 MG PO TBDP: 4.0000 mg | ORAL_TABLET | Freq: Three times a day (TID) | ORAL | 0 refills | Status: DC | PRN

## 2018-07-30 NOTE — Telephone Encounter (Signed)
Called pt and advised of results/recommendations. Pt advised that urgent referral has been placed to [facility] as she is currently asymptomatic. She has been advised to go to the ED if she starts to develop and nausea, vomiting, diarrhea, or abd pain. Pt verbalized understanding. Referral has been placed.

## 2018-07-30 NOTE — Telephone Encounter (Signed)
Call received from [facility] radiology (Jan) with results. Have printed and placed on your computer for your review.

## 2018-07-30 NOTE — Progress Notes (Signed)
Crystal Patrick is a 73 y.o. female here for an acute visit.  History of Present Illness:   Anselmo Pickler, LPN, scribe for Dr. Juleen China  HPI: Abdominal Pain  This is a new problem. The current episode started today (started around 1:00 AM). The onset quality is sudden. The problem occurs constantly. The most recent episode lasted 5 hours. The problem has been resolved. The pain is located in the RLQ. The pain is at a severity of 6/10 (Last night ). The pain is moderate. The quality of the pain is sharp (stabbbing pain). Pain radiation: mid back area. Associated symptoms include diarrhea (several times during episode), headaches, nausea and vomiting (5 x's ). Pertinent negatives include no constipation, dysuria, fever or flatus. Nothing aggravates the pain. The pain is relieved by nothing. Treatments tried: Pepto bismol. The treatment provided no relief. Her past medical history is significant for abdominal surgery (Appendectomy) and GERD. There is no history of gallstones or irritable bowel syndrome.   PMHx, SurgHx, SocialHx, Medications, and Allergies were reviewed in the Visit Navigator and updated as appropriate.  Current Medications   .  buPROPion (WELLBUTRIN XL) 150 MG 24 hr tablet, Take 150 mg by mouth daily. , Disp: , Rfl:  .  BusPIRone HCl (BUSPAR PO), Take 20 mg by mouth 2 (two) times daily., Disp: , Rfl:  .  Cholecalciferol (VITAMIN D3) 2000 UNITS TABS, Take 2,000 Units by mouth daily., Disp: , Rfl:  .  Esomeprazole Magnesium (NEXIUM PO), Take 40 mg by mouth daily. , Disp: , Rfl:  .  fenofibrate 160 MG tablet, TAKE 1 TABLET DAILY (NEEDS TO SCHEDULE PHYSICAL EXAM), Disp: 90 tablet, Rfl: 0 .  FLUoxetine (PROZAC) 40 MG capsule, 20 mg. Take 2 tablets by mouth once daily, Disp: , Rfl:  .  magnesium gluconate (MAGONATE) 500 MG tablet, Take 500 mg by mouth daily., Disp: , Rfl:  .  Potassium Gluconate 595 MG CAPS, Take 595 mg by mouth daily., Disp: , Rfl:  .  temazepam (RESTORIL) 15 MG  capsule, Take 30 mg by mouth at bedtime as needed. , Disp: , Rfl:  .  tolterodine (DETROL LA) 4 MG 24 hr capsule, TAKE 1 CAPSULE DAILY, Disp: 90 capsule, Rfl: 1 .  valsartan-hydrochlorothiazide (DIOVAN-HCT) 80-12.5 MG tablet, Take 1 tablet by mouth daily., Disp: 90 tablet, Rfl: 1 .  ondansetron (ZOFRAN ODT) 4 MG disintegrating tablet, Take 1 tablet (4 mg total) by mouth every 8 (eight) hours as needed for nausea or vomiting., Disp: 20 tablet, Rfl: 0   Allergies  Allergen Reactions  . Other     Hormone replacement therapy-per patient she cannot recall reaction  . Statins     Liver failue   Review of Systems   Pertinent items are noted in the HPI. Otherwise, ROS is negative.  Vitals   Vitals:   07/30/18 1231  BP: 140/80  Pulse: 84  Temp: 98.7 F (37.1 C)  TempSrc: Oral  SpO2: 96%  Weight: 150 lb (68 kg)  Height: 5\' 2"  (1.575 m)     Body mass index is 27.44 kg/m.  Physical Exam   Physical Exam Vitals signs and nursing note reviewed.  HENT:     Head: Normocephalic and atraumatic.  Eyes:     Pupils: Pupils are equal, round, and reactive to light.  Neck:     Musculoskeletal: Normal range of motion and neck supple.  Cardiovascular:     Rate and Rhythm: Normal rate and regular rhythm.  Heart sounds: Normal heart sounds.  Pulmonary:     Effort: Pulmonary effort is normal.  Abdominal:     Palpations: Abdomen is soft.     Tenderness: There is abdominal tenderness in the right upper quadrant.  Skin:    General: Skin is warm.  Psychiatric:        Behavior: Behavior normal.      Results for orders placed or performed in visit on 07/30/18  CBC with Differential/Platelet  Result Value Ref Range   WBC 6.5 4.0 - 10.5 K/uL   RBC 4.60 3.87 - 5.11 Mil/uL   Hemoglobin 14.2 12.0 - 15.0 g/dL   HCT 41.7 36.0 - 46.0 %   MCV 90.7 78.0 - 100.0 fl   MCHC 34.0 30.0 - 36.0 g/dL   RDW 13.6 11.5 - 15.5 %   Platelets 376.0 150.0 - 400.0 K/uL   Neutrophils Relative % 63.3 43.0 -  77.0 %   Lymphocytes Relative 26.4 12.0 - 46.0 %   Monocytes Relative 8.9 3.0 - 12.0 %   Eosinophils Relative 0.8 0.0 - 5.0 %   Basophils Relative 0.6 0.0 - 3.0 %   Neutro Abs 4.1 1.4 - 7.7 K/uL   Lymphs Abs 1.7 0.7 - 4.0 K/uL   Monocytes Absolute 0.6 0.1 - 1.0 K/uL   Eosinophils Absolute 0.1 0.0 - 0.7 K/uL   Basophils Absolute 0.0 0.0 - 0.1 K/uL  Comprehensive metabolic panel  Result Value Ref Range   Sodium 137 135 - 145 mEq/L   Potassium 4.0 3.5 - 5.1 mEq/L   Chloride 101 96 - 112 mEq/L   CO2 26 19 - 32 mEq/L   Glucose, Bld 83 70 - 99 mg/dL   BUN 19 6 - 23 mg/dL   Creatinine, Ser 0.92 0.40 - 1.20 mg/dL   Total Bilirubin 0.6 0.2 - 1.2 mg/dL   Alkaline Phosphatase 47 39 - 117 U/L   AST 19 0 - 37 U/L   ALT 26 0 - 35 U/L   Total Protein 7.1 6.0 - 8.3 g/dL   Albumin 4.8 3.5 - 5.2 g/dL   Calcium 9.8 8.4 - 10.5 mg/dL   GFR 60.17 >60.00 mL/min  Lipase  Result Value Ref Range   Lipase 7.0 (L) 11.0 - 59.0 U/L   US Abdomen Limited Ruq  Result Date: 07/30/2018 CLINICAL DATA:  Abdominal pain with vomiting EXAM: ULTRASOUND ABDOMEN LIMITED RIGHT UPPER QUADRANT COMPARISON:  CT abdomen and pelvis November 19, 2013 FINDINGS: Gallbladder: Gallbladder is contracted with thickened wall. There are echogenic foci in the gallbladder which shadow, felt to represent cholelithiasis. Largest gallstone measures 4 mm. There is slight pericholecystic fluid. No sonographic Murphy sign noted by sonographer. Common bile duct: Diameter: 5 mm. No intrahepatic or extrahepatic biliary duct dilatation. Liver: No focal lesion identified. Liver echogenicity overall is increased. Portal vein is patent on color Doppler imaging with normal direction of blood flow towards the liver. IMPRESSION: 1. Cholelithiasis with contracted, thick-walled gallbladder. Mild pericholecystic fluid. These findings are concerning for a degree of acute cholecystitis. 2. Increase in liver echogenicity, a finding felt to be indicative of a degree of  hepatic steatosis. No focal liver lesions evident. These results will be called to the ordering clinician or representative by the Radiologist Assistant, and communication documented in the PACS or zVision Dashboard. Electronically Signed   By: Lowella Grip III M.D.   On: 07/30/2018 14:41   Assessment and Plan   Varnell was seen today for abdominal pain.  Diagnoses and all  orders for this visit:  Right upper quadrant abdominal pain -     CBC with Differential/Platelet -     Comprehensive metabolic panel -     Lipase -     ondansetron (ZOFRAN ODT) 4 MG disintegrating tablet; Take 1 tablet (4 mg total) by mouth every 8 (eight) hours as needed for nausea or vomiting. -     US ABDOMEN LIMITED RUQ -     ondansetron (ZOFRAN-ODT) disintegrating tablet 4 mg   . Reviewed expectations re: course of current medical issues. . Discussed self-management of symptoms. . Outlined signs and symptoms indicating need for more acute intervention. . Patient verbalized understanding and all questions were answered. Marland Kitchen Health Maintenance issues including appropriate healthy diet, exercise, and smoking avoidance were discussed with patient. . See orders for this visit as documented in the electronic medical record. . Patient received an After Visit Summary.  CMA served as Education administrator during this visit. History, Physical, and Plan performed by medical provider. The above documentation has been reviewed and is accurate and complete. Briscoe Deutscher, D.O.  Briscoe Deutscher, DO [place], [facility] [place] 07/31/2018

## 2018-07-31 ENCOUNTER — Encounter: Payer: Self-pay | Admitting: Family Medicine

## 2018-08-04 ENCOUNTER — Other Ambulatory Visit: Payer: Self-pay | Admitting: Family Medicine

## 2018-08-05 ENCOUNTER — Ambulatory Visit: Payer: Self-pay | Admitting: Surgery

## 2018-08-05 DIAGNOSIS — K8010 Calculus of gallbladder with chronic cholecystitis without obstruction: Secondary | ICD-10-CM | POA: Diagnosis not present

## 2018-08-06 ENCOUNTER — Ambulatory Visit (HOSPITAL_COMMUNITY): Payer: Self-pay | Admitting: Surgery

## 2018-08-06 NOTE — H&P (Signed)
History of Present Illness Crystal Patrick. Crystal Kochanski MD; 08/06/2018 3:30 PM) The patient is a 73 year old female who presents for evaluation of gall stones. Referred by Hermelinda Medicus, D.O. for symptomatic gallstones   This is a 73 year old female who presents with 2 weeks of right upper quadrant abdominal pain. She had a severe episode that began on 07/29/18. This radiated into her right upper quadrant through to her back. This is associated with nausea and vomiting. This lasted about 6 hours. She has also had significant abdominal bloating and increased flatulence. She denies any fever or constipation. She underwent ultrasound that showed gallstones with some mild wall thickening. Lipase was normal. Liver function tests were normal. White count was normal. She continues to have some intermittent discomfort but she has been watching her diet carefully.  CLINICAL DATA: Abdominal pain with vomiting  EXAM: ULTRASOUND ABDOMEN LIMITED RIGHT UPPER QUADRANT  COMPARISON: CT abdomen and pelvis November 19, 2013  FINDINGS: Gallbladder:  Gallbladder is contracted with thickened wall. There are echogenic foci in the gallbladder which shadow, felt to represent cholelithiasis. Largest gallstone measures 4 mm. There is slight pericholecystic fluid. No sonographic Murphy sign noted by sonographer.  Common bile duct:  Diameter: 5 mm. No intrahepatic or extrahepatic biliary duct dilatation.  Liver:  No focal lesion identified. Liver echogenicity overall is increased. Portal vein is patent on color Doppler imaging with normal direction of blood flow towards the liver.  IMPRESSION: 1. Cholelithiasis with contracted, thick-walled gallbladder. Mild pericholecystic fluid. These findings are concerning for a degree of acute cholecystitis.  2. Increase in liver echogenicity, a finding felt to be indicative of a degree of hepatic steatosis. No focal liver lesions evident.  These results will be called to  the ordering clinician or representative by the Radiologist Assistant, and communication documented in the PACS or zVision Dashboard.   Electronically Signed By: Crystal Patrick M.D. On: 07/30/2018 14:41   Past Surgical History Crystal Patrick, Crystal Patrick; 08/05/2018 11:14 AM) Appendectomy Cataract Surgery Bilateral. Colon Polyp Removal - Colonoscopy Foot Surgery Bilateral. Oral Surgery Tonsillectomy  Diagnostic Studies History Crystal Patrick, CMA; 08/05/2018 11:14 AM) Colonoscopy 1-5 years ago Mammogram 1-3 years ago Pap Smear 1-5 years ago  Allergies Crystal Patrick, CMA; 08/05/2018 11:15 AM) Statins Allergies Reconciled  Medication History Crystal Patrick, CMA; 08/05/2018 11:16 AM) buPROPion HCl ER (XL) (150MG  Tablet ER 24HR, Oral) Active. busPIRone HCl (15MG  Tablet, Oral) Active. Temazepam (15MG  Capsule, Oral) Active. Detrol LA (4MG  Capsule ER 24HR, Oral) Active. FLUoxetine HCl (40MG  Capsule, Oral) Active. Valsartan-hydroCHLOROthiazide (80-12.5MG  Tablet, Oral) Active. Fenofibrate (40MG  Tablet, Oral) Active. Medications Reconciled  Social History Crystal Patrick, [place]; 08/05/2018 11:14 AM) Alcohol use Moderate alcohol use. Caffeine use Carbonated beverages, Tea. No drug use Tobacco use Never smoker.  Family History Crystal Patrick, [place]; 08/05/2018 11:14 AM) Anesthetic complications Daughter. Arthritis Daughter, Mother, Sister. Colon Polyps Daughter. Depression Daughter. Diabetes Mellitus Daughter. Heart Disease Brother. Heart disease in female family member before age 77 Heart disease in female family member before age 49 Hypertension Father. Migraine Headache Sister.  Pregnancy / Birth History Crystal Patrick, [place]; 08/05/2018 11:14 AM) Age at menarche 21 years. Age of menopause <47 Gravida 2 Maternal age 33-25 Para 3  Other Problems Crystal Patrick, [place]; 08/05/2018 11:14 AM) Anxiety Disorder Arthritis Bladder  Problems Cholelithiasis Depression Gastroesophageal Reflux Disease Hemorrhoids High blood pressure Hypercholesterolemia Inguinal Hernia Melanoma     Review of Systems Crystal Patrick CMA; 08/05/2018 11:14 AM) General Present- Fatigue. Not Present- Appetite Loss, Chills, Fever, Night Sweats, Weight  Gain and Weight Loss. HEENT Present- Seasonal Allergies and Sore Throat. Not Present- Earache, Hearing Loss, Hoarseness, Nose Bleed, Oral Ulcers, Ringing in the Ears, Sinus Pain, Visual Disturbances, Wears glasses/contact lenses and Yellow Eyes. Respiratory Present- Snoring. Not Present- Bloody sputum, Chronic Cough, Difficulty Breathing and Wheezing. Cardiovascular Not Present- Chest Pain, Difficulty Breathing Lying Down, Leg Cramps, Palpitations, Rapid Heart Rate, Shortness of Breath and Swelling of Extremities. Gastrointestinal Present- Abdominal Pain, Bloating, Excessive gas, Hemorrhoids, Nausea and Vomiting. Not Present- Bloody Stool, Change in Bowel Habits, Chronic diarrhea, Constipation, Difficulty Swallowing, Gets full quickly at meals, Indigestion and Rectal Pain. Female Genitourinary Present- Pelvic Pain. Not Present- Frequency, Nocturia, Painful Urination and Urgency. Musculoskeletal Present- Back Pain. Not Present- Joint Pain, Joint Stiffness, Muscle Pain, Muscle Weakness and Swelling of Extremities. Neurological Present- Headaches. Not Present- Decreased Memory, Fainting, Numbness, Seizures, Tingling, Tremor, Trouble walking and Weakness. Psychiatric Present- Anxiety and Depression. Not Present- Bipolar, Change in Sleep Pattern, Fearful and Frequent crying. Endocrine Not Present- Cold Intolerance, Excessive Hunger, Hair Changes, Heat Intolerance, Hot flashes and New Diabetes. Hematology Not Present- Blood Thinners, Easy Bruising, Excessive bleeding, Gland problems, HIV and Persistent Infections.  Vitals Crystal Patrick CMA; 08/05/2018 11:15 AM) 08/05/2018 11:14 AM Weight:  149.2 lb Height: 62in Body Surface Area: 1.69 m Body Mass Index: 27.29 kg/m  Temp.: 97.61F  Pulse: 90 (Regular)  BP: 154/84 (Sitting, Left Arm, Standard)      Physical Exam Crystal Key K. Skarlett Sedlacek MD; 08/06/2018 3:30 PM)  The physical exam findings are as follows: Note:WDWN in NAD Eyes: Pupils equal, round; sclera anicteric HENT: Oral mucosa moist; good dentition Neck: No masses palpated, no thyromegaly Lungs: CTA bilaterally; normal respiratory effort CV: Regular rate and rhythm; no murmurs; extremities well-perfused with no edema Abd: +bowel sounds, soft, mildly tender in RUQ, no palpable organomegaly; no palpable hernias Skin: Warm, dry; no sign of jaundice Psychiatric - alert and oriented x 4; calm mood and affect    Assessment & Plan Crystal Key K. Nygel Prokop MD; 08/06/2018 3:31 PM)  CHRONIC CHOLECYSTITIS WITH CALCULUS (K80.10)  Current Plans Schedule for Surgery -Laparoscopic cholecystectomy with intraoperative cholangiogram. The surgical procedure has been discussed with the patient. Potential risks, benefits, alternative treatments, and expected outcomes have been explained. All of the patient's questions at this time have been answered. The likelihood of reaching the patient's treatment goal is good. The patient understand the proposed surgical procedure and wishes to proceed.  Her surgery may be delayed a few weeks because of restrictions from the COVID-19 pandemic.  Crystal Patrick. Crystal Dover, MD, [facility] [phone]  08/06/2018 3:32 PM

## 2018-08-09 NOTE — Pre-Procedure Instructions (Signed)
Crystal Patrick  08/09/2018      [place], [place] [address] [place] [address] Phone: [phone] Fax: [phone]  [facility] [place], [place] [address] [place] [address] Phone: [phone] Fax: [phone]    Your procedure is scheduled on Tuesday, March 31st.  Report to [facility] Entrance "A" Admitting at 5:30 A.M.  Call this number if you have problems the morning of surgery:  [phone]   Remember:  Do not eat after midnight.  You may drink clear liquids until 4:30 A.M (3 hours prior to procedure time).  Clear liquids allowed [id number], Juice (non-citric and without pulp), Carbonated beverages, Clear Tea, Black Coffee only and Gatorade    Take these medicines the morning of surgery with A SIP OF WATER  buPROPion (WELLBUTRIN XL) BusPIRone HCl  FLUoxetine (PROZAC)  tolterodine (DETROL LA)  As needed: ondansetron (ZOFRAN ODT)  7 days prior to surgery STOP taking any Aspirin (unless otherwise instructed by your surgeon), Aleve, Naproxen, Ibuprofen, Motrin, Advil, Goody's, BC's, all herbal medications, fish oil, and all vitamins.    Do not wear jewelry, make-up or nail polish.  Do not wear lotions, powders, or perfumes, or deodorant.  Do not shave 48 hours prior to surgery.   Do not bring valuables to the hospital.  [facility] is not responsible for any belongings or valuables.  Contacts, dentures or bridgework may not be worn into surgery.  Leave your suitcase in the car.  After surgery it may be brought to your room.  For patients admitted to the hospital, discharge time will be determined by your treatment team.  Patients discharged the day of surgery will not be allowed to drive home.   Special instructions:   [place]- Preparing For Surgery  Before surgery, you can play an important role. Because skin is not sterile,  your skin needs to be as free of germs as possible. You can reduce the number of germs on your skin by washing with CHG (chlorahexidine gluconate) Soap before surgery.  CHG is an antiseptic cleaner which kills germs and bonds with the skin to continue killing germs even after washing.    Oral Hygiene is also important to reduce your risk of infection.  Remember - BRUSH YOUR TEETH THE MORNING OF SURGERY WITH YOUR REGULAR TOOTHPASTE  Please do not use if you have an allergy to CHG or antibacterial soaps. If your skin becomes reddened/irritated stop using the CHG.  Do not shave (including legs and underarms) for at least 48 hours prior to first CHG shower. It is OK to shave your face.  Please follow these instructions carefully.   1. Shower the NIGHT BEFORE SURGERY and the MORNING OF SURGERY with CHG.   2. If you chose to wash your hair, wash your hair first as usual with your normal shampoo.  3. After you shampoo, rinse your hair and body thoroughly to remove the shampoo.  4. Use CHG as you would any other liquid soap. You can apply CHG directly to the skin and wash gently with a scrungie or a clean washcloth.   5. Apply the CHG Soap to your body ONLY FROM THE NECK DOWN.  Do not use on open wounds or open sores. Avoid contact with your eyes, ears, mouth and genitals (private parts). Wash Face and genitals (private parts)  with your normal soap.  6. Wash  thoroughly, paying special attention to the area where your surgery will be performed.  7. Thoroughly rinse your body with warm water from the neck down.  8. DO NOT shower/wash with your normal soap after using and rinsing off the CHG Soap.  9. Pat yourself dry with a CLEAN TOWEL.  10. Wear CLEAN PAJAMAS to bed the night before surgery, wear comfortable clothes the morning of surgery  11. Place CLEAN SHEETS on your bed the night of your first shower and DO NOT SLEEP WITH PETS.    Day of Surgery:  Do not apply any deodorants/lotions.   Please wear clean clothes to the hospital/surgery center.   Remember to brush your teeth WITH YOUR REGULAR TOOTHPASTE.   Please read over the following fact sheets that you were given.

## 2018-08-12 ENCOUNTER — Inpatient Hospital Stay (HOSPITAL_COMMUNITY)
Admission: RE | Admit: 2018-08-12 | Discharge: 2018-08-12 | Disposition: A | Discharge status: Home / Self Care | Payer: Medicare Other | Source: Ambulatory Visit

## 2018-08-20 ENCOUNTER — Encounter (HOSPITAL_COMMUNITY): Admission: RE | Payer: Self-pay | Source: Home / Self Care

## 2018-08-20 ENCOUNTER — Ambulatory Visit (HOSPITAL_COMMUNITY): Admission: RE | Admit: 2018-08-20 | Payer: Medicare Other | Source: Home / Self Care | Admitting: Surgery

## 2018-08-20 SURGERY — LAPAROSCOPIC CHOLECYSTECTOMY WITH INTRAOPERATIVE CHOLANGIOGRAM
Anesthesia: General

## 2018-08-31 ENCOUNTER — Inpatient Hospital Stay (HOSPITAL_COMMUNITY)
Admission: EM | Admit: 2018-08-31 | Discharge: 2018-09-02 | DRG: 418 | Disposition: A | Discharge status: Home / Self Care | Payer: Medicare Other | Attending: Student | Admitting: Student

## 2018-08-31 ENCOUNTER — Encounter (HOSPITAL_COMMUNITY): Payer: Self-pay | Admitting: Emergency Medicine

## 2018-08-31 ENCOUNTER — Observation Stay (HOSPITAL_COMMUNITY): Payer: Medicare Other

## 2018-08-31 ENCOUNTER — Encounter (HOSPITAL_COMMUNITY): Payer: Self-pay

## 2018-08-31 ENCOUNTER — Emergency Department (HOSPITAL_COMMUNITY): Payer: Medicare Other

## 2018-08-31 ENCOUNTER — Other Ambulatory Visit: Payer: Self-pay

## 2018-08-31 DIAGNOSIS — K8012 Calculus of gallbladder with acute and chronic cholecystitis without obstruction: Secondary | ICD-10-CM | POA: Diagnosis not present

## 2018-08-31 DIAGNOSIS — E785 Hyperlipidemia, unspecified: Secondary | ICD-10-CM | POA: Diagnosis not present

## 2018-08-31 DIAGNOSIS — I10 Essential (primary) hypertension: Secondary | ICD-10-CM | POA: Diagnosis not present

## 2018-08-31 DIAGNOSIS — K8000 Calculus of gallbladder with acute cholecystitis without obstruction: Secondary | ICD-10-CM | POA: Diagnosis not present

## 2018-08-31 DIAGNOSIS — R740 Nonspecific elevation of levels of transaminase and lactic acid dehydrogenase [LDH]: Secondary | ICD-10-CM

## 2018-08-31 DIAGNOSIS — F419 Anxiety disorder, unspecified: Secondary | ICD-10-CM | POA: Diagnosis present

## 2018-08-31 DIAGNOSIS — K219 Gastro-esophageal reflux disease without esophagitis: Secondary | ICD-10-CM | POA: Diagnosis present

## 2018-08-31 DIAGNOSIS — K851 Biliary acute pancreatitis without necrosis or infection: Principal | ICD-10-CM | POA: Diagnosis present

## 2018-08-31 DIAGNOSIS — Z808 Family history of malignant neoplasm of other organs or systems: Secondary | ICD-10-CM

## 2018-08-31 DIAGNOSIS — Z833 Family history of diabetes mellitus: Secondary | ICD-10-CM

## 2018-08-31 DIAGNOSIS — M1990 Unspecified osteoarthritis, unspecified site: Secondary | ICD-10-CM | POA: Diagnosis present

## 2018-08-31 DIAGNOSIS — M810 Age-related osteoporosis without current pathological fracture: Secondary | ICD-10-CM | POA: Diagnosis present

## 2018-08-31 DIAGNOSIS — K8010 Calculus of gallbladder with chronic cholecystitis without obstruction: Secondary | ICD-10-CM

## 2018-08-31 DIAGNOSIS — K449 Diaphragmatic hernia without obstruction or gangrene: Secondary | ICD-10-CM | POA: Diagnosis present

## 2018-08-31 DIAGNOSIS — Z888 Allergy status to other drugs, medicaments and biological substances status: Secondary | ICD-10-CM

## 2018-08-31 DIAGNOSIS — Z8042 Family history of malignant neoplasm of prostate: Secondary | ICD-10-CM

## 2018-08-31 DIAGNOSIS — Z8249 Family history of ischemic heart disease and other diseases of the circulatory system: Secondary | ICD-10-CM

## 2018-08-31 DIAGNOSIS — F329 Major depressive disorder, single episode, unspecified: Secondary | ICD-10-CM | POA: Diagnosis present

## 2018-08-31 DIAGNOSIS — E876 Hypokalemia: Secondary | ICD-10-CM

## 2018-08-31 DIAGNOSIS — R1011 Right upper quadrant pain: Secondary | ICD-10-CM | POA: Diagnosis not present

## 2018-08-31 DIAGNOSIS — R7401 Elevation of levels of liver transaminase levels: Secondary | ICD-10-CM

## 2018-08-31 DIAGNOSIS — Z8711 Personal history of peptic ulcer disease: Secondary | ICD-10-CM

## 2018-08-31 DIAGNOSIS — K859 Acute pancreatitis without necrosis or infection, unspecified: Secondary | ICD-10-CM

## 2018-08-31 DIAGNOSIS — Z8041 Family history of malignant neoplasm of ovary: Secondary | ICD-10-CM

## 2018-08-31 DIAGNOSIS — Z8052 Family history of malignant neoplasm of bladder: Secondary | ICD-10-CM

## 2018-08-31 DIAGNOSIS — K660 Peritoneal adhesions (postprocedural) (postinfection): Secondary | ICD-10-CM | POA: Diagnosis present

## 2018-08-31 DIAGNOSIS — K760 Fatty (change of) liver, not elsewhere classified: Secondary | ICD-10-CM | POA: Diagnosis present

## 2018-08-31 HISTORY — DX: Biliary acute pancreatitis without necrosis or infection: K85.10

## 2018-08-31 LAB — COMPREHENSIVE METABOLIC PANEL
ALT: 93 U/L — ABNORMAL HIGH (ref 0–44)
ALT: 144 U/L — ABNORMAL HIGH (ref 0–44)
AST: 105 U/L — ABNORMAL HIGH (ref 15–41)
AST: 192 U/L — ABNORMAL HIGH (ref 15–41)
Albumin: 4.1 g/dL (ref 3.5–5.0)
Albumin: 3.3 g/dL — ABNORMAL LOW (ref 3.5–5.0)
Alkaline Phosphatase: 86 U/L (ref 38–126)
Alkaline Phosphatase: 81 U/L (ref 38–126)
Anion gap: 13 (ref 5–15)
Anion gap: 10 (ref 5–15)
BUN: 15 mg/dL (ref 8–23)
BUN: 12 mg/dL (ref 8–23)
CO2: 24 mmol/L (ref 22–32)
CO2: 24 mmol/L (ref 22–32)
Calcium: 9.3 mg/dL (ref 8.9–10.3)
Calcium: 8.5 mg/dL — ABNORMAL LOW (ref 8.9–10.3)
Chloride: 101 mmol/L (ref 98–111)
Chloride: 106 mmol/L (ref 98–111)
Creatinine, Ser: 0.84 mg/dL (ref 0.44–1.00)
Creatinine, Ser: 0.78 mg/dL (ref 0.44–1.00)
GFR calc Af Amer: >60 mL/min (ref >60)
GFR calc Af Amer: >60 mL/min (ref >60)
GFR calc non Af Amer: >60 mL/min (ref >60)
GFR calc non Af Amer: >60 mL/min (ref >60)
Glucose, Bld: 112 mg/dL — ABNORMAL HIGH (ref 70–99)
Glucose, Bld: 95 mg/dL (ref 70–99)
Potassium: 3.4 mmol/L — ABNORMAL LOW (ref 3.5–5.1)
Potassium: 3.6 mmol/L (ref 3.5–5.1)
Sodium: 138 mmol/L (ref 135–145)
Sodium: 140 mmol/L (ref 135–145)
Total Bilirubin: 1 mg/dL (ref 0.3–1.2)
Total Bilirubin: 1.1 mg/dL (ref 0.3–1.2)
Total Protein: 6.5 g/dL (ref 6.5–8.1)
Total Protein: 5.5 g/dL — ABNORMAL LOW (ref 6.5–8.1)

## 2018-08-31 LAB — CBC
HCT: 40.7 % (ref 36.0–46.0)
HCT: 37.1 % (ref 36.0–46.0)
Hemoglobin: 13.1 g/dL (ref 12.0–15.0)
Hemoglobin: 11.8 g/dL — ABNORMAL LOW (ref 12.0–15.0)
MCH: 29.6 pg (ref 26.0–34.0)
MCH: 29.2 pg (ref 26.0–34.0)
MCHC: 32.2 g/dL (ref 30.0–36.0)
MCHC: 31.8 g/dL (ref 30.0–36.0)
MCV: 91.9 fL (ref 80.0–100.0)
MCV: 91.8 fL (ref 80.0–100.0)
Platelets: 350 10*3/uL (ref 150–400)
Platelets: 299 10*3/uL (ref 150–400)
RBC: 4.43 MIL/uL (ref 3.87–5.11)
RBC: 4.04 MIL/uL (ref 3.87–5.11)
RDW: 12.9 % (ref 11.5–15.5)
RDW: 12.9 % (ref 11.5–15.5)
WBC: 7 10*3/uL (ref 4.0–10.5)
WBC: 5 10*3/uL (ref 4.0–10.5)
nRBC: 0 % (ref 0.0–0.2)
nRBC: 0 % (ref 0.0–0.2)

## 2018-08-31 LAB — URINALYSIS, ROUTINE W REFLEX MICROSCOPIC
Bilirubin Urine: NEGATIVE
Glucose, UA: NEGATIVE mg/dL
Ketones, ur: NEGATIVE mg/dL
Nitrite: NEGATIVE
Protein, ur: NEGATIVE mg/dL
Specific Gravity, Urine: 1.015 (ref 1.005–1.030)
pH: 7 (ref 5.0–8.0)

## 2018-08-31 LAB — PROTIME-INR
INR: 1 (ref 0.8–1.2)
Prothrombin Time: 13.2 seconds (ref 11.4–15.2)

## 2018-08-31 LAB — LIPID PANEL
Cholesterol: 153 mg/dL (ref 0–200)
HDL: 54 mg/dL (ref >40)
LDL Cholesterol: 91 mg/dL (ref 0–99)
Total CHOL/HDL Ratio: 2.8 RATIO
Triglycerides: 42 mg/dL (ref <150)
VLDL: 8 mg/dL (ref 0–40)

## 2018-08-31 LAB — LIPASE, BLOOD: Lipase: 570 U/L — ABNORMAL HIGH (ref 11–51)

## 2018-08-31 LAB — MAGNESIUM: Magnesium: 2.1 mg/dL (ref 1.7–2.4)

## 2018-08-31 MED ORDER — ONDANSETRON HCL 4 MG/2ML IJ SOLN: 4.0000 mg | Freq: Four times a day (QID) | INTRAMUSCULAR | Status: DC | PRN

## 2018-08-31 MED ORDER — ACETAMINOPHEN 650 MG RE SUPP: 650.0000 mg | Freq: Four times a day (QID) | RECTAL | Status: DC | PRN

## 2018-08-31 MED ORDER — SODIUM CHLORIDE 0.9% FLUSH: 3.0000 mL | Freq: Once | INTRAVENOUS | Status: DC

## 2018-08-31 MED ORDER — ONDANSETRON HCL 4 MG/2ML IJ SOLN
4.0000 mg | Freq: Once | INTRAMUSCULAR | Status: DC
  Filled 2018-08-31: qty 2

## 2018-08-31 MED ORDER — SODIUM CHLORIDE 0.9 % IV SOLN: INTRAVENOUS | Status: DC

## 2018-08-31 MED ORDER — SODIUM CHLORIDE 0.9 % IV BOLUS
1000.0000 mL | Freq: Once | INTRAVENOUS | Status: AC
  Administered 2018-08-31: 1000 mL via INTRAVENOUS

## 2018-08-31 MED ORDER — MORPHINE SULFATE (PF) 4 MG/ML IV SOLN
4.0000 mg | Freq: Once | INTRAVENOUS | Status: DC
  Filled 2018-08-31: qty 1

## 2018-08-31 MED ORDER — HEPARIN SODIUM (PORCINE) 5000 UNIT/ML IJ SOLN
5000.0000 [IU] | Freq: Three times a day (TID) | INTRAMUSCULAR | Status: DC
  Administered 2018-08-31 – 2018-09-02 (×5): 5000 [IU] via SUBCUTANEOUS
  Filled 2018-08-31 (×6): qty 1

## 2018-08-31 MED ORDER — POTASSIUM CHLORIDE 10 MEQ/100ML IV SOLN
10.0000 meq | Freq: Once | INTRAVENOUS | Status: AC
  Administered 2018-08-31: 10 meq via INTRAVENOUS
  Filled 2018-08-31: qty 100

## 2018-08-31 MED ORDER — POTASSIUM CHLORIDE 2 MEQ/ML IV SOLN
INTRAVENOUS | Status: AC
  Administered 2018-08-31 – 2018-09-02 (×4): via INTRAVENOUS
  Filled 2018-08-31 (×6): qty 1000

## 2018-08-31 MED ORDER — ACETAMINOPHEN 325 MG PO TABS: 650.0000 mg | ORAL_TABLET | Freq: Four times a day (QID) | ORAL | Status: DC | PRN

## 2018-08-31 MED ORDER — LORAZEPAM 2 MG/ML IJ SOLN: 1.0000 mg | Freq: Once | INTRAMUSCULAR | Status: DC | PRN

## 2018-08-31 MED ORDER — HYDRALAZINE HCL 20 MG/ML IJ SOLN: 10.0000 mg | Freq: Four times a day (QID) | INTRAMUSCULAR | Status: DC | PRN

## 2018-08-31 MED ORDER — HYDRALAZINE HCL 20 MG/ML IJ SOLN
5.0000 mg | INTRAMUSCULAR | Status: DC | PRN
  Administered 2018-08-31: 5 mg via INTRAVENOUS
  Filled 2018-08-31: qty 1

## 2018-08-31 MED ORDER — MORPHINE SULFATE (PF) 2 MG/ML IV SOLN
1.0000 mg | INTRAVENOUS | Status: DC | PRN
  Administered 2018-09-01 – 2018-09-02 (×4): 1 mg via INTRAVENOUS
  Filled 2018-08-31 (×5): qty 1

## 2018-08-31 NOTE — ED Notes (Addendum)
PER [facility] EDP- abd pain labs

## 2018-08-31 NOTE — ED Provider Notes (Signed)
[facility] EMERGENCY DEPARTMENT Provider Note   CSN: [id number] Arrival date & time: 08/31/18  0028    History   Chief Complaint Chief Complaint  Patient presents with  . Abdominal Pain    HPI Crystal Patrick is a 73 y.o. female.  The history is provided by the patient.  She has history of hypertension, hyperlipidemia, cholelithiasis and comes in with right upper quadrant pain radiating through to the back which started about 9 PM.  This pain is similar to what she had with prior episodes of biliary colic.  She was scheduled to have elective cholecystectomy on March 31, but surgery was canceled because of COVID-19.  She states that she also has had 2 other episodes of biliary colic which were not as severe.  She currently rates her pain at 4/10.  There is associated nausea but no vomiting.  She denies fever or chills.  She did have positive with butter sauce prior to onset of her pain.  Nothing makes it better, nothing makes it worse.  Past Medical History:  Diagnosis Date  . Anxiety and depression    sees psychiatrist for this  . Arthritis   . Chicken pox   . GERD (gastroesophageal reflux disease)    with hiatal hernia, esophageal stricture  . Hiatal hernia   . Hx of osteoporosis 03/17/2016   -did not tol bisphosphonates -did injections -does not wish to re-assess or repeat DEXA -doing walking and Vit D  . Hyperlipidemia    has refused treatment or further evaluation other then lifestyle changes  . Hypertension   . Liver failure ([place])    due to Baycol  . Overactive bladder 06/02/2013  . Peptic ulcer   . Seasonal allergies   . Squamous cell carcinoma   . Status post dilation of esophageal narrowing     Patient Active Problem List   Diagnosis Date Noted  . MDD (major depressive disorder) 03/17/2016  . BMI 29.0-29.9,adult 03/17/2016  . Hx of osteoporosis 03/17/2016  . Essential hypertension 10/30/2014  . Seasonal allergies 10/30/2014  . GERD  (gastroesophageal reflux disease) 06/02/2013  . Hyperlipidemia 06/02/2013  . Overactive bladder 06/02/2013    Past Surgical History:  Procedure Laterality Date  . APPENDECTOMY  1964  . BUNIONECTOMY Bilateral     1 foot x 2,   . NASAL/SINUS ENDOSCOPY     x 3  . TONSILLECTOMY AND ADENOIDECTOMY       OB History   No obstetric history on file.      Home Medications    Prior to Admission medications   Medication Sig Start Date End Date Taking? Authorizing Provider  buPROPion (WELLBUTRIN XL) 150 MG 24 hr tablet Take 150 mg by mouth daily.  07/16/18   [provider]  BusPIRone HCl (BUSPAR PO) Take 15 mg by mouth 2 (two) times daily.     [provider]  Cholecalciferol (VITAMIN D3) 125 MCG (5000 UT) TABS Take 5,000 Units by mouth daily.     [provider]  esomeprazole (NEXIUM) 20 MG capsule Take 40 mg by mouth at bedtime.     [provider]  fenofibrate 160 MG tablet TAKE 1 TABLET DAILY (NEEDS TO SCHEDULE PHYSICAL EXAM) Patient taking differently: Take 160 mg by mouth daily.  10/01/17   [name], DO  FLUoxetine (PROZAC) 40 MG capsule Take 40 mg by mouth daily.     [provider]  magnesium gluconate (MAGONATE) 500 MG tablet Take 500 mg  by mouth daily.    [provider]  ondansetron (ZOFRAN ODT) 4 MG disintegrating tablet Take 1 tablet (4 mg total) by mouth every 8 (eight) hours as needed for nausea or vomiting. 07/30/18   Briscoe Deutscher, DO  potassium gluconate 595 (99 K) MG TABS tablet Take 99 mg by mouth daily.     [provider]  temazepam (RESTORIL) 15 MG capsule Take 30 mg by mouth at bedtime.  07/16/18   [provider]  tolterodine (DETROL LA) 4 MG 24 hr capsule TAKE 1 CAPSULE DAILY Patient taking differently: Take 4 mg by mouth daily.  04/01/18   [name], DO  valsartan-hydrochlorothiazide (DIOVAN-HCT) 80-12.5 MG tablet TAKE 1 TABLET DAILY Patient taking differently: Take 1 tablet by mouth  daily.  08/05/18   [name], DO  Vitamin E 180 MG CAPS Take 180 mg by mouth daily.    [provider]    Family History Family History  Problem Relation Age of Onset  . Throat cancer Father   . Heart disease Father   . Thyroid cancer Sister   . Ovarian cancer Paternal Aunt   . Prostate cancer Brother   . Prostate cancer Maternal Uncle        x 2  . Bladder Cancer Maternal Uncle   . Diabetes Daughter   . Dementia Mother     Social History Social History   Tobacco Use  . Smoking status: Never Smoker  . Smokeless tobacco: Never Used  Substance Use Topics  . Alcohol use: Yes    Alcohol/week: 0.0 standard drinks    Comment: one drink a day  . Drug use: No     Allergies   Other and Statins   Review of Systems Review of Systems  All other systems reviewed and are negative.    Physical Exam Updated Vital Signs BP (!) 113/43   Pulse 79   Temp 98.1 F (36.7 C) (Oral)   Resp 16   SpO2 95%   Physical Exam Vitals signs and nursing note reviewed.    73 year old [place], resting comfortably and in no acute distress. Vital signs are normal. Oxygen saturation is 95%, which is normal. Head is normocephalic and atraumatic. PERRLA, EOMI. Oropharynx is clear. Neck is nontender and supple without adenopathy or JVD. Back is nontender and there is no CVA tenderness. Lungs are clear without rales, wheezes, or rhonchi. Chest is nontender. Heart has regular rate and rhythm with 2/6 systolic ejection murmur heard at the upper left sternal border. Abdomen is soft, flat, with mild right upper quadrant tenderness.  There is no rebound or guarding.  There are no masses or hepatosplenomegaly and peristalsis is hypoactive. Extremities have no cyanosis or edema, full range of motion is present. Skin is warm and dry without rash. Neurologic: Mental status is normal, cranial nerves are intact, there are no motor or sensory deficits.  ED Treatments / Results  Labs (all  labs ordered are listed, but only abnormal results are displayed) Labs Reviewed  LIPASE, BLOOD - Abnormal; Notable for the following components:      Result Value   Lipase 570 (*)    All other components within normal limits  COMPREHENSIVE METABOLIC PANEL - Abnormal; Notable for the following components:   Potassium 3.4 (*)    Glucose, Bld 112 (*)    AST 105 (*)    ALT 93 (*)    All other components within normal limits  URINALYSIS, ROUTINE W REFLEX  MICROSCOPIC - Abnormal; Notable for the following components:   APPearance HAZY (*)    Hgb urine dipstick SMALL (*)    Leukocytes,Ua SMALL (*)    Bacteria, UA RARE (*)    All other components within normal limits  CBC    EKG None  Radiology No results found.  Procedures Procedures   Medications Ordered in ED Medications  sodium chloride flush (NS) 0.9 % injection 3 mL (has no administration in time range)  sodium chloride 0.9 % bolus 1,000 mL (has no administration in time range)  ondansetron (ZOFRAN) injection 4 mg (has no administration in time range)  morphine 4 MG/ML injection 4 mg (has no administration in time range)     Initial Impression / Assessment and Plan / ED Course  I have reviewed the triage vital signs and the nursing notes.  Pertinent labs & imaging results that were available during my care of the patient were reviewed by me and considered in my medical decision making (see chart for details).  Biliary colic.  Old records reviewed confirming right upper quadrant ultrasound on March 10 showing cholelithiasis.  Laboratory work-up today shows significant elevation of lipase with mild elevation of transaminases.  She will need to be admitted for ERCP and endoscopic sphincterotomy and surgical consultation for possible cholecystectomy.  She is given IV fluids, morphine, ondansetron.  She is also noted to be mildly hypokalemic and is given intravenous potassium.  Case is discussed with Dr. Marlowe Sax of [facility], who agrees to admit the patient.  Final Clinical Impressions(s) / ED Diagnoses   Final diagnoses:  Acute gallstone pancreatitis  Elevated transaminase level  Hypokalemia    ED Discharge Orders    None       Delora Fuel, MD 54/65/68 [phone]

## 2018-08-31 NOTE — H&P (Signed)
History and Physical    KRYSTYNE TEWKSBURY BPZ:[id number] DOB: Jun 20, 1945 DOA: 08/31/2018  PCP: [name], DO Patient coming from: Home  Chief Complaint: Abdominal pain  HPI: Crystal HENTON is a 73 y.o. female with medical history significant of cholelithiasis, peptic ulcer disease, GERD, hiatal hernia, prior appendectomy, hypertension, hyperlipidemia, anxiety, depression presenting to the hospital for evaluation of right upper quadrant abdominal pain.  Patient states she has been having gallbladder pains for the past 1 month and was supposed to have an elective cholecystectomy done on March 31 but surgery was canceled because of COVID-19 pandemic.  States last night she had shrimp with butter for dinner and a few hours later started having right upper quadrant abdominal pain.  The pain is dull in character, 4 out of 10 intensity, and constant.  She is feeling nauseous but has not vomited.  Denies fevers or chills.  Denies recent travel, sick contacts, cough, shortness of breath, or exposure to any individual with confirmed COVID-19.  Review of Systems: As per HPI otherwise 10 point review of systems negative.  Past Medical History:  Diagnosis Date  . Anxiety and depression    sees psychiatrist for this  . Arthritis   . Chicken pox   . GERD (gastroesophageal reflux disease)    with hiatal hernia, esophageal stricture  . Hiatal hernia   . Hx of osteoporosis 03/17/2016   -did not tol bisphosphonates -did injections -does not wish to re-assess or repeat DEXA -doing walking and Vit D  . Hyperlipidemia    has refused treatment or further evaluation other then lifestyle changes  . Hypertension   . Liver failure ([place])    due to Baycol  . Overactive bladder 06/02/2013  . Peptic ulcer   . Seasonal allergies   . Squamous cell carcinoma   . Status post dilation of esophageal narrowing     Past Surgical History:  Procedure Laterality Date  . APPENDECTOMY  1964  . BUNIONECTOMY Bilateral    1 foot x 2,   . NASAL/SINUS ENDOSCOPY     x 3  . TONSILLECTOMY AND ADENOIDECTOMY       reports that she has never smoked. She has never used smokeless tobacco. She reports current alcohol use. She reports that she does not use drugs.  Allergies  Allergen Reactions  . Other     Hormone replacement therapy-per patient she cannot recall reaction  . Statins     Liver failue    Family History  Problem Relation Age of Onset  . Throat cancer Father   . Heart disease Father   . Thyroid cancer Sister   . Ovarian cancer Paternal Aunt   . Prostate cancer Brother   . Prostate cancer Maternal Uncle        x 2  . Bladder Cancer Maternal Uncle   . Diabetes Daughter   . Dementia Mother     Prior to Admission medications   Medication Sig Start Date End Date Taking? Authorizing Provider  buPROPion (WELLBUTRIN XL) 150 MG 24 hr tablet Take 150 mg by mouth daily.  07/16/18   [provider]  BusPIRone HCl (BUSPAR PO) Take 15 mg by mouth 2 (two) times daily.     [provider]  Cholecalciferol (VITAMIN D3) 125 MCG (5000 UT) TABS Take 5,000 Units by mouth daily.     [provider]  esomeprazole (NEXIUM) 20 MG capsule Take 40 mg by mouth at bedtime.     [provider]  fenofibrate 160 MG tablet TAKE 1 TABLET DAILY (NEEDS TO SCHEDULE PHYSICAL EXAM) Patient taking differently: Take 160 mg by mouth daily.  10/01/17   [name], DO  FLUoxetine (PROZAC) 40 MG capsule Take 40 mg by mouth daily.     [provider]  magnesium gluconate (MAGONATE) 500 MG tablet Take 500 mg by mouth daily.    [provider]  ondansetron (ZOFRAN ODT) 4 MG disintegrating tablet Take 1 tablet (4 mg total) by mouth every 8 (eight) hours as needed for nausea or vomiting. 07/30/18   Briscoe Deutscher, DO  potassium gluconate 595 (99 K) MG TABS tablet Take 99 mg by mouth daily.     [provider]  temazepam (RESTORIL) 15 MG capsule Take 30 mg by mouth at bedtime.   07/16/18   [provider]  tolterodine (DETROL LA) 4 MG 24 hr capsule TAKE 1 CAPSULE DAILY Patient taking differently: Take 4 mg by mouth daily.  04/01/18   [name], DO  valsartan-hydrochlorothiazide (DIOVAN-HCT) 80-12.5 MG tablet TAKE 1 TABLET DAILY Patient taking differently: Take 1 tablet by mouth daily.  08/05/18   [name], DO  Vitamin E 180 MG CAPS Take 180 mg by mouth daily.    [provider]    Physical Exam: Vitals:   08/31/18 0154 08/31/18 0200 08/31/18 0230 08/31/18 0445  BP: (!) 113/43 132/88 (!) 124/91 (!) 171/85  Pulse: 79 75 77 78  Resp:    18  Temp:    97.8 F (36.6 C)  TempSrc:    Oral  SpO2: 95% 97% 96% 98%  Weight:    67.8 kg  Height:    5\' 2"  (1.575 m)    Physical Exam  Constitutional: She is oriented to person, place, and time. She appears well-developed and well-nourished. No distress.  HENT:  Head: Normocephalic.  Eyes: Right eye exhibits no discharge. Left eye exhibits no discharge.  Neck: Neck supple.  Cardiovascular: Normal rate, regular rhythm and intact distal pulses.  Pulmonary/Chest: Effort normal and breath sounds normal. No respiratory distress. She has no wheezes. She has no rales.  Abdominal: Soft. Bowel sounds are normal. She exhibits no distension. There is abdominal tenderness. There is no rebound and no guarding.  Right upper quadrant and epigastrium mildly tender to deep palpation  Musculoskeletal:        General: No edema.  Neurological: She is alert and oriented to person, place, and time.  Skin: Skin is warm and dry. She is not diaphoretic.     Labs on Admission: I have personally reviewed following labs and imaging studies  CBC: Recent Labs  Lab 08/31/18 0051  WBC 7.0  HGB 13.1  HCT 40.7  MCV 91.9  PLT 097   Basic Metabolic Panel: Recent Labs  Lab 08/31/18 0051  NA 138  K 3.4*  CL 101  CO2 24  GLUCOSE 112*  BUN 15  CREATININE 0.84  CALCIUM 9.3   GFR: Estimated Creatinine Clearance:  55.5 mL/min (by C-G formula based on SCr of 0.84 mg/dL). Liver Function Tests: Recent Labs  Lab 08/31/18 0051  AST 105*  ALT 93*  ALKPHOS 86  BILITOT 1.0  PROT 6.5  ALBUMIN 4.1   Recent Labs  Lab 08/31/18 0051  LIPASE 570*   No results for input(s): AMMONIA in the last 168 hours. Coagulation Profile: No results for input(s): INR, PROTIME in the last 168 hours. Cardiac Enzymes: No results for input(s): CKTOTAL, CKMB, CKMBINDEX, TROPONINI in the last  168 hours. BNP (last 3 results) No results for input(s): PROBNP in the last 8760 hours. HbA1C: No results for input(s): HGBA1C in the last 72 hours. CBG: No results for input(s): GLUCAP in the last 168 hours. Lipid Profile: No results for input(s): CHOL, HDL, LDLCALC, TRIG, CHOLHDL, LDLDIRECT in the last 72 hours. Thyroid Function Tests: No results for input(s): TSH, T4TOTAL, FREET4, T3FREE, THYROIDAB in the last 72 hours. Anemia Panel: No results for input(s): VITAMINB12, FOLATE, FERRITIN, TIBC, IRON, RETICCTPCT in the last 72 hours. Urine analysis:    Component Value Date/Time   COLORURINE YELLOW 08/31/2018 0049   APPEARANCEUR HAZY (A) 08/31/2018 0049   LABSPEC 1.015 08/31/2018 0049   PHURINE 7.0 08/31/2018 0049   GLUCOSEU NEGATIVE 08/31/2018 0049   GLUCOSEU NEGATIVE 09/22/2013 1451   HGBUR SMALL (A) 08/31/2018 0049   BILIRUBINUR NEGATIVE 08/31/2018 0049   BILIRUBINUR negative 09/17/2014 0820   KETONESUR NEGATIVE 08/31/2018 0049   PROTEINUR NEGATIVE 08/31/2018 0049   UROBILINOGEN 0.2 09/17/2014 0820   UROBILINOGEN 0.2 09/22/2013 1451   NITRITE NEGATIVE 08/31/2018 0049   LEUKOCYTESUR SMALL (A) 08/31/2018 0049    Radiological Exams on Admission: US Abdomen Limited  Result Date: 08/31/2018 CLINICAL DATA:  Right upper quadrant epigastric pain tonight. Known gallstones. Elevated lipase. EXAM: ULTRASOUND ABDOMEN LIMITED RIGHT UPPER QUADRANT COMPARISON:  Ultrasound 07/30/2018 FINDINGS: Gallbladder: Contracted with  gallstones and gallbladder wall thickening, wall thicknesses of 6-30mm. No pericholecystic fluid. No sonographic Murphy sign noted by sonographer. Common bile duct: Diameter: 4-6 mm, normal. Liver: No focal lesion identified. Heterogeneous and slightly increased parenchymal echogenicity. Portal vein is patent on color Doppler imaging with normal direction of blood flow towards the liver. IMPRESSION: 1. Contracted gallbladder with gallstones and wall thickening, similar in appearance to prior exam. Findings suggest chronic cholecystitis. Negative sonographic Murphy sign biliary dilatation. 2. Mild hepatic steatosis. Electronically Signed   By: Keith Rake M.D.   On: 08/31/2018 03:14    Assessment/Plan Principal Problem:   Gallstone pancreatitis Active Problems:   Essential hypertension   Hypokalemia   Gallstone pancreatitis Patient has a history of cholelithiasis.  Scheduled elective cholecystectomy was recently canceled in the setting of COVID-19 pandemic.  She started having biliary colic again last night. No leukocytosis.  No signs of sepsis at this time.  Lipase significantly elevated.  Transaminases mildly elevated.  T bili normal. No fever or jaundice to suggest cholangitis. Right upper quadrant ultrasound with evidence of chronic cholecystitis, negative sonographic Murphy sign, and no biliary dilatation. -IV fluid hydration -IV Zofran PRN -IV morphine PRN -Keep n.p.o. -Consult GI in the morning for ERCP.  In addition, surgical consultation will be needed for cholecystectomy. -Continue to monitor LFTs  Mild hypokalemia -Potassium 3.4. Replete potassium.  Check magnesium level.  Continue to monitor BMP.  Hypertension -IV hydralazine PRN  Unable to safely order home medications at this time as pharmacy medication reconciliation is pending.   DVT prophylaxis: Subcutaneous heparin Code Status: Full code Family Communication: No family available. Disposition Plan: Anticipate  discharge after clinical improvement. Consults called: None Admission status: Observation, MedSurg  This chart was dictated using voice recognition software.  Despite best efforts to proofread, errors can occur which can change the documentation meaning.  Shela Leff MD [facility] Pager [phone]  If 7PM-7AM, please contact night-coverage [email or web address] Password [facility]  08/31/2018, 6:11 AM

## 2018-08-31 NOTE — Progress Notes (Signed)
PROGRESS NOTE                                                                                                                                                                                                             Patient Demographics:    Crystal Patrick, is a 73 y.o. female, DOB - August 28, 1945, KDT:[id number]  Admit date - 08/31/2018   Admitting Physician Shela Leff, MD  Outpatient Primary MD for the patient is [name], DO  LOS - 0  Chief Complaint  Patient presents with  . Abdominal Pain       Brief Narrative  Crystal Patrick is a 73 y.o. female with medical history significant of cholelithiasis, peptic ulcer disease, GERD, hiatal hernia, prior appendectomy, hypertension, hyperlipidemia, anxiety, depression presenting to the hospital for evaluation of epigastric abdominal pain work-up suggestive of acute on chronic cholecystitis with possible gallstone pancreatitis.   Subjective:    Crystal Patrick today has, No headache, No chest pain, mild nonradiating epigastric abdominal pain - No Nausea, No new weakness tingling or numbness, No Cough - SOB.     Assessment  & Plan :     1.  Acute on chronic cholecystitis with small possibility of gallstone pancreatitis.  Pain is mostly epigastric but nonradiating, she has had off-and-on discomfort for several weeks, she was recommended cholecystectomy outpatient by general surgery.  Currently feels much better, continue n.p.o., IV fluids, pain control.  Check MRCP.  Trend LFTs, triglycerides stable.  General surgery consulted.  She will likely require cholecystectomy one way or the other.  2.  Hypokalemia.  Replaced and stable.  3.  Essential hypertension.  PRN hydralazine.    Family Communication  :  None  Code Status :  Full  Disposition Plan  :  TBD  Consults  :  CCS  Procedures  :    [place] -  1. Contracted gallbladder with gallstones and wall thickening, similar in appearance to prior exam. Findings suggest  chronic cholecystitis. Negative sonographic Murphy sign biliary dilatation. 2. Mild hepatic steatosis.  MRCP -   DVT Prophylaxis  :   Heparin   Lab Results  Component Value Date   PLT 299 08/31/2018    Diet :  Diet Order            Diet NPO time specified  Diet effective now               Inpatient Medications Scheduled Meds: . heparin  5,000 Units Subcutaneous Q8H  .  sodium chloride flush  3 mL Intravenous Once   Continuous Infusions: . lactated ringers with kcl 125 mL/hr at 08/31/18 0905   PRN Meds:.acetaminophen **OR** acetaminophen, hydrALAZINE, LORazepam, morphine injection, ondansetron (ZOFRAN) IV  Antibiotics  :   Anti-infectives (From admission, onward)   None       Objective:   Vitals:   08/31/18 0200 08/31/18 0230 08/31/18 0445 08/31/18 0655  BP: 132/88 (!) 124/91 (!) 171/85 (!) 153/72  Pulse: 75 77 78   Resp:   18   Temp:   97.8 F (36.6 C)   TempSrc:   Oral   SpO2: 97% 96% 98%   Weight:   67.8 kg   Height:   5\' 2"  (1.575 m)     Wt Readings from Last 3 Encounters:  08/31/18 67.8 kg  07/30/18 68 kg  02/05/18 71.8 kg    No intake or output data in the 24 hours ending 08/31/18 1000   Physical Exam  Awake Alert, Oriented X 3, No new F.N deficits, Normal affect [place].AT,PERRAL Supple Neck,No JVD, No cervical lymphadenopathy appriciated.  Symmetrical Chest wall movement, Good air movement bilaterally, CTAB RRR,No Gallops,Rubs or new Murmurs, No Parasternal Heave +ve B.Sounds, Abd Soft, mild epigastric tenderness, No organomegaly appriciated, No rebound - guarding or rigidity. No Cyanosis, Clubbing or edema, No new Rash or bruise       Data Review:    CBC Recent Labs  Lab 08/31/18 0051 08/31/18 0802  WBC 7.0 5.0  HGB 13.1 11.8*  HCT 40.7 37.1  PLT 350 299  MCV 91.9 91.8  MCH 29.6 29.2  MCHC 32.2 31.8  RDW 12.9 12.9    Chemistries  Recent Labs  Lab 08/31/18 0051 08/31/18 0802  NA 138 140  K 3.4* 3.6  CL 101 106  CO2 24  24  GLUCOSE 112* 95  BUN 15 12  CREATININE 0.84 0.78  CALCIUM 9.3 8.5*  MG 2.1  --   AST 105* 192*  ALT 93* 144*  ALKPHOS 86 81  BILITOT 1.0 1.1   ------------------------------------------------------------------------------------------------------------------ Recent Labs    08/31/18 0802  CHOL 153  HDL 54  LDLCALC 91  TRIG 42  CHOLHDL 2.8    Lab Results  Component Value Date   HGBA1C 6.0 02/05/2018   ------------------------------------------------------------------------------------------------------------------ No results for input(s): TSH, T4TOTAL, T3FREE, THYROIDAB in the last 72 hours.  Invalid input(s): FREET3 ------------------------------------------------------------------------------------------------------------------ No results for input(s): VITAMINB12, FOLATE, FERRITIN, TIBC, IRON, RETICCTPCT in the last 72 hours.  Coagulation profile Recent Labs  Lab 08/31/18 0802  INR 1.0    No results for input(s): DDIMER in the last 72 hours.  Cardiac Enzymes No results for input(s): CKMB, TROPONINI, MYOGLOBIN in the last 168 hours.  Invalid input(s): CK ------------------------------------------------------------------------------------------------------------------ No results found for: BNP  Micro Results No results found for this or any previous visit (from the past 240 hour(s)).  Radiology Reports US Abdomen Limited  Result Date: 08/31/2018 CLINICAL DATA:  Right upper quadrant epigastric pain tonight. Known gallstones. Elevated lipase. EXAM: ULTRASOUND ABDOMEN LIMITED RIGHT UPPER QUADRANT COMPARISON:  Ultrasound 07/30/2018 FINDINGS: Gallbladder: Contracted with gallstones and gallbladder wall thickening, wall thicknesses of 6-76mm. No pericholecystic fluid. No sonographic Murphy sign noted by sonographer. Common bile duct: Diameter: 4-6 mm, normal. Liver: No focal lesion identified. Heterogeneous and slightly increased parenchymal echogenicity. Portal vein  is patent on color Doppler imaging with normal direction of blood flow towards the liver. IMPRESSION: 1. Contracted gallbladder with gallstones and wall thickening, similar in appearance to prior exam.  Findings suggest chronic cholecystitis. Negative sonographic Murphy sign biliary dilatation. 2. Mild hepatic steatosis. Electronically Signed   By: Keith Rake M.D.   On: 08/31/2018 03:14    Time Spent in minutes  30   Lala Lund M.D on 08/31/2018 at 10:00 AM  To page go to [email or web address] - password [facility]

## 2018-08-31 NOTE — ED Triage Notes (Addendum)
C/o "gallbladder attack" with RUQ pain, nausea, and dizziness since 9pm.  States she was scheduled for cholecystectomy on 3/31 by Dr. Georgette Dover and it was postponed due to [place].

## 2018-08-31 NOTE — ED Notes (Signed)
Patient transported to Ultrasound 

## 2018-08-31 NOTE — ED Notes (Signed)
Admitting Provider at bedside. 

## 2018-08-31 NOTE — ED Notes (Signed)
ED TO INPATIENT HANDOFF REPORT  ED Nurse Name and Phone #: Gwyndolyn Saxon [phone]  S Name/Age/Gender Crystal Patrick 73 y.o. female Room/Bed: 022C/022C  Code Status   Code Status: Not on file  Home/SNF/Other Home Patient oriented to: self, place, time and situation Is this baseline? Yes   Triage Complete: Triage complete  Chief Complaint Abdominal Pain  Triage Note C/o "gallbladder attack" with RUQ pain, nausea, and dizziness since 9pm.  States she was scheduled for cholecystectomy on 3/31 by Dr. Georgette Dover and it was postponed due to [place].    Allergies Allergies  Allergen Reactions  . Other     Hormone replacement therapy-per patient she cannot recall reaction  . Statins     Liver failue    Level of Care/Admitting Diagnosis ED Disposition    ED Disposition Condition [facility] Area: [place] [100100]  Level of Care: Med-Surg [16]  I expect the patient will be discharged within 24 hours: No (not a candidate for 5C-Observation unit)  Diagnosis: Gallstone pancreatitis [485462]  Admitting Physician: Shela Leff [[id number]]  Attending Physician: Shela Leff [[id number]]  PT Class (Do Not Modify): Observation [104]  PT Acc Code (Do Not Modify): Observation [10022]       B Medical/Surgery History Past Medical History:  Diagnosis Date  . Anxiety and depression    sees psychiatrist for this  . Arthritis   . Chicken pox   . GERD (gastroesophageal reflux disease)    with hiatal hernia, esophageal stricture  . Hiatal hernia   . Hx of osteoporosis 03/17/2016   -did not tol bisphosphonates -did injections -does not wish to re-assess or repeat DEXA -doing walking and Vit D  . Hyperlipidemia    has refused treatment or further evaluation other then lifestyle changes  . Hypertension   . Liver failure ([place])    due to [place]  . Overactive bladder 06/02/2013  . Peptic ulcer   . Seasonal allergies   . Squamous cell carcinoma    . Status post dilation of esophageal narrowing    Past Surgical History:  Procedure Laterality Date  . APPENDECTOMY  1964  . BUNIONECTOMY Bilateral     1 foot x 2,   . NASAL/SINUS ENDOSCOPY     x 3  . TONSILLECTOMY AND ADENOIDECTOMY       A IV Location/Drains/Wounds Patient Lines/Drains/Airways Status   Active Line/Drains/Airways    None          Intake/Output Last 24 hours No intake or output data in the 24 hours ending 08/31/18 0326  Labs/Imaging Results for orders placed or performed during the hospital encounter of 08/31/18 (from the past 48 hour(s))  Urinalysis, Routine w reflex microscopic     Status: Abnormal   Collection Time: 08/31/18 12:49 AM  Result Value Ref Range   Color, Urine YELLOW YELLOW   APPearance HAZY (A) CLEAR   Specific Gravity, Urine 1.015 1.005 - 1.030   pH 7.0 5.0 - 8.0   Glucose, UA NEGATIVE NEGATIVE mg/dL   Hgb urine dipstick SMALL (A) NEGATIVE   Bilirubin Urine NEGATIVE NEGATIVE   Ketones, ur NEGATIVE NEGATIVE mg/dL   Protein, ur NEGATIVE NEGATIVE mg/dL   Nitrite NEGATIVE NEGATIVE   Leukocytes,Ua SMALL (A) NEGATIVE   RBC / HPF 0-5 0 - 5 RBC/hpf   WBC, UA 0-5 0 - 5 WBC/hpf   Bacteria, UA RARE (A) NONE SEEN   Squamous Epithelial / LPF 0-5 0 - 5   Mucus PRESENT  Comment: Performed at [facility], [place] [address], [place], [place] [address]  Lipase, blood     Status: Abnormal   Collection Time: 08/31/18 12:51 AM  Result Value Ref Range   Lipase 570 (H) 11 - 51 U/L    Comment: RESULTS CONFIRMED BY MANUAL DILUTION Performed at [facility], [place] [address], [place], [place] [address]   Comprehensive metabolic panel     Status: Abnormal   Collection Time: 08/31/18 12:51 AM  Result Value Ref Range   Sodium 138 135 - 145 mmol/L   Potassium 3.4 (L) 3.5 - 5.1 mmol/L   Chloride 101 98 - 111 mmol/L   CO2 24 22 - 32 mmol/L   Glucose, Bld 112 (H) 70 - 99 mg/dL   BUN 15 8 - 23 mg/dL   Creatinine, Ser 0.84 0.44 - 1.00 mg/dL    Calcium 9.3 8.9 - 10.3 mg/dL   Total Protein 6.5 6.5 - 8.1 g/dL   Albumin 4.1 3.5 - 5.0 g/dL   AST 105 (H) 15 - 41 U/L   ALT 93 (H) 0 - 44 U/L   Alkaline Phosphatase 86 38 - 126 U/L   Total Bilirubin 1.0 0.3 - 1.2 mg/dL   GFR calc non Af Amer >60 >60 mL/min   GFR calc Af Amer >60 >60 mL/min   Anion gap 13 5 - 15    Comment: Performed at [facility], [place] [address], [place], [place] [address]  CBC     Status: None   Collection Time: 08/31/18 12:51 AM  Result Value Ref Range   WBC 7.0 4.0 - 10.5 K/uL   RBC 4.43 3.87 - 5.11 MIL/uL   Hemoglobin 13.1 12.0 - 15.0 g/dL   HCT 40.7 36.0 - 46.0 %   MCV 91.9 80.0 - 100.0 fL   MCH 29.6 26.0 - 34.0 pg   MCHC 32.2 30.0 - 36.0 g/dL   RDW 12.9 11.5 - 15.5 %   Platelets 350 150 - 400 K/uL   nRBC 0.0 0.0 - 0.2 %    Comment: Performed at [facility], [place] [address] [place], [place] [address]   US Abdomen Limited  Result Date: 08/31/2018 CLINICAL DATA:  Right upper quadrant epigastric pain tonight. Known gallstones. Elevated lipase. EXAM: ULTRASOUND ABDOMEN LIMITED RIGHT UPPER QUADRANT COMPARISON:  Ultrasound 07/30/2018 FINDINGS: Gallbladder: Contracted with gallstones and gallbladder wall thickening, wall thicknesses of 6-64mm. No pericholecystic fluid. No sonographic Murphy sign noted by sonographer. Common bile duct: Diameter: 4-6 mm, normal. Liver: No focal lesion identified. Heterogeneous and slightly increased parenchymal echogenicity. Portal vein is patent on color Doppler imaging with normal direction of blood flow towards the liver. IMPRESSION: 1. Contracted gallbladder with gallstones and wall thickening, similar in appearance to prior exam. Findings suggest chronic cholecystitis. Negative sonographic Murphy sign biliary dilatation. 2. Mild hepatic steatosis. Electronically Signed   By: Keith Rake M.D.   On: 08/31/2018 03:14    Pending Labs Unresulted Labs (From admission, onward)   None      Vitals/Pain Today's Vitals    08/31/18 0154 08/31/18 0156 08/31/18 0200 08/31/18 0230  BP: (!) 113/43  132/88 (!) 124/91  Pulse: 79  75 77  Resp:      Temp:      TempSrc:      SpO2: 95%  97% 96%  PainSc:  4       Isolation Precautions No active isolations  Medications Medications  sodium chloride flush (NS) 0.9 %  injection 3 mL (has no administration in time range)  sodium chloride 0.9 % bolus 1,000 mL (has no administration in time range)  ondansetron (ZOFRAN) injection 4 mg (has no administration in time range)  morphine 4 MG/ML injection 4 mg (has no administration in time range)  potassium chloride 10 mEq in 100 mL IVPB (has no administration in time range)    Mobility walks Low fall risk   Focused Assessments    R Recommendations: See Admitting Provider Note  Report given to:   Additional Notes:

## 2018-08-31 NOTE — Progress Notes (Signed)
   Subjective/Chief Complaint: Patient seen by Dr Georgette Dover in last 3 weeks for chronic cholecystitis, may have had surgery scheduled but was postponed.  She had been having a couple weeks of ruq pain to her back.  US showed stones with mild pericholecystic fluid came in last night for ruq pain that was worse not getting better lipase is elevated. Repeat US showed: IMPRESSION: 1. Contracted gallbladder with gallstones and wall thickening, similar in appearance to prior exam. Findings suggest chronic cholecystitis. Negative sonographic Murphy sign biliary dilatation. 2. Mild hepatic steatosis.   Objective: Vital signs in last 24 hours: Temp:  [97.8 F (36.6 C)-98.1 F (36.7 C)] 97.8 F (36.6 C) (04/11 0445) Pulse Rate:  [75-80] 78 (04/11 0445) Resp:  [16-18] 18 (04/11 0445) BP: (113-171)/(43-91) 153/72 (04/11 0655) SpO2:  [95 %-98 %] 98 % (04/11 0445) Weight:  [67.8 kg] 67.8 kg (04/11 0445) Last BM Date: (PTA)  Intake/Output from previous day: No intake/output data recorded. Intake/Output this shift: No intake/output data recorded.  cv rrr Lungs clear abd tender ruq and epigastrium no murphys sign  Lab Results:  Recent Labs    08/31/18 0051 08/31/18 0802  WBC 7.0 5.0  HGB 13.1 11.8*  HCT 40.7 37.1  PLT 350 299   BMET Recent Labs    08/31/18 0051 08/31/18 0802  NA 138 140  K 3.4* 3.6  CL 101 106  CO2 24 24  GLUCOSE 112* 95  BUN 15 12  CREATININE 0.84 0.78  CALCIUM 9.3 8.5*   PT/INR Recent Labs    08/31/18 0802  LABPROT 13.2  INR 1.0   ABG No results for input(s): PHART, HCO3 in the last 72 hours.  Invalid input(s): PCO2, PO2  Studies/Results: US Abdomen Limited  Result Date: 08/31/2018 CLINICAL DATA:  Right upper quadrant epigastric pain tonight. Known gallstones. Elevated lipase. EXAM: ULTRASOUND ABDOMEN LIMITED RIGHT UPPER QUADRANT COMPARISON:  Ultrasound 07/30/2018 FINDINGS: Gallbladder: Contracted with gallstones and gallbladder wall thickening,  wall thicknesses of 6-67mm. No pericholecystic fluid. No sonographic Murphy sign noted by sonographer. Common bile duct: Diameter: 4-6 mm, normal. Liver: No focal lesion identified. Heterogeneous and slightly increased parenchymal echogenicity. Portal vein is patent on color Doppler imaging with normal direction of blood flow towards the liver. IMPRESSION: 1. Contracted gallbladder with gallstones and wall thickening, similar in appearance to prior exam. Findings suggest chronic cholecystitis. Negative sonographic Murphy sign biliary dilatation. 2. Mild hepatic steatosis. Electronically Signed   By: Keith Rake M.D.   On: 08/31/2018 03:14    Anti-infectives: Anti-infectives (From admission, onward)   None      Assessment/Plan: Chronic calculous cholecystitis Mild GSP -pancreatitis likely will resolve quickly, will recheck labs in am -plan for lap chole either tomorrow or Monday -discussed plan with patient  Rolm Bookbinder 08/31/2018

## 2018-09-01 ENCOUNTER — Observation Stay (HOSPITAL_COMMUNITY): Payer: Medicare Other | Admitting: Certified Registered Nurse Anesthetist

## 2018-09-01 ENCOUNTER — Encounter (HOSPITAL_COMMUNITY)
Admission: EM | Disposition: A | Discharge status: Home / Self Care | Payer: Self-pay | Source: Home / Self Care | Attending: Internal Medicine

## 2018-09-01 DIAGNOSIS — E785 Hyperlipidemia, unspecified: Secondary | ICD-10-CM | POA: Diagnosis not present

## 2018-09-01 DIAGNOSIS — M1990 Unspecified osteoarthritis, unspecified site: Secondary | ICD-10-CM | POA: Diagnosis present

## 2018-09-01 DIAGNOSIS — K660 Peritoneal adhesions (postprocedural) (postinfection): Secondary | ICD-10-CM | POA: Diagnosis present

## 2018-09-01 DIAGNOSIS — E876 Hypokalemia: Secondary | ICD-10-CM | POA: Diagnosis not present

## 2018-09-01 DIAGNOSIS — M810 Age-related osteoporosis without current pathological fracture: Secondary | ICD-10-CM | POA: Diagnosis not present

## 2018-09-01 DIAGNOSIS — Z833 Family history of diabetes mellitus: Secondary | ICD-10-CM | POA: Diagnosis not present

## 2018-09-01 DIAGNOSIS — Z8711 Personal history of peptic ulcer disease: Secondary | ICD-10-CM | POA: Diagnosis not present

## 2018-09-01 DIAGNOSIS — F419 Anxiety disorder, unspecified: Secondary | ICD-10-CM | POA: Diagnosis present

## 2018-09-01 DIAGNOSIS — Z808 Family history of malignant neoplasm of other organs or systems: Secondary | ICD-10-CM | POA: Diagnosis not present

## 2018-09-01 DIAGNOSIS — K8010 Calculus of gallbladder with chronic cholecystitis without obstruction: Secondary | ICD-10-CM | POA: Diagnosis not present

## 2018-09-01 DIAGNOSIS — Z8041 Family history of malignant neoplasm of ovary: Secondary | ICD-10-CM | POA: Diagnosis not present

## 2018-09-01 DIAGNOSIS — Z888 Allergy status to other drugs, medicaments and biological substances status: Secondary | ICD-10-CM | POA: Diagnosis not present

## 2018-09-01 DIAGNOSIS — K851 Biliary acute pancreatitis without necrosis or infection: Secondary | ICD-10-CM | POA: Diagnosis not present

## 2018-09-01 DIAGNOSIS — I10 Essential (primary) hypertension: Secondary | ICD-10-CM | POA: Diagnosis not present

## 2018-09-01 DIAGNOSIS — K219 Gastro-esophageal reflux disease without esophagitis: Secondary | ICD-10-CM | POA: Diagnosis present

## 2018-09-01 DIAGNOSIS — R1011 Right upper quadrant pain: Secondary | ICD-10-CM | POA: Diagnosis not present

## 2018-09-01 DIAGNOSIS — K449 Diaphragmatic hernia without obstruction or gangrene: Secondary | ICD-10-CM | POA: Diagnosis present

## 2018-09-01 DIAGNOSIS — Z8249 Family history of ischemic heart disease and other diseases of the circulatory system: Secondary | ICD-10-CM | POA: Diagnosis not present

## 2018-09-01 DIAGNOSIS — K760 Fatty (change of) liver, not elsewhere classified: Secondary | ICD-10-CM | POA: Diagnosis present

## 2018-09-01 DIAGNOSIS — K8012 Calculus of gallbladder with acute and chronic cholecystitis without obstruction: Secondary | ICD-10-CM | POA: Diagnosis present

## 2018-09-01 DIAGNOSIS — Z8042 Family history of malignant neoplasm of prostate: Secondary | ICD-10-CM | POA: Diagnosis not present

## 2018-09-01 DIAGNOSIS — F329 Major depressive disorder, single episode, unspecified: Secondary | ICD-10-CM | POA: Diagnosis present

## 2018-09-01 DIAGNOSIS — Z8052 Family history of malignant neoplasm of bladder: Secondary | ICD-10-CM | POA: Diagnosis not present

## 2018-09-01 HISTORY — PX: CHOLECYSTECTOMY: SHX55

## 2018-09-01 LAB — COMPREHENSIVE METABOLIC PANEL
ALT: 112 U/L — ABNORMAL HIGH (ref 0–44)
AST: 69 U/L — ABNORMAL HIGH (ref 15–41)
Albumin: 3.6 g/dL (ref 3.5–5.0)
Alkaline Phosphatase: 70 U/L (ref 38–126)
Anion gap: 15 (ref 5–15)
BUN: 8 mg/dL (ref 8–23)
CO2: 18 mmol/L — ABNORMAL LOW (ref 22–32)
Calcium: 9.1 mg/dL (ref 8.9–10.3)
Chloride: 105 mmol/L (ref 98–111)
Creatinine, Ser: 0.76 mg/dL (ref 0.44–1.00)
GFR calc Af Amer: >60 mL/min (ref >60)
GFR calc non Af Amer: >60 mL/min (ref >60)
Glucose, Bld: 59 mg/dL — ABNORMAL LOW (ref 70–99)
Potassium: 3.9 mmol/L (ref 3.5–5.1)
Sodium: 138 mmol/L (ref 135–145)
Total Bilirubin: 1.1 mg/dL (ref 0.3–1.2)
Total Protein: 5.9 g/dL — ABNORMAL LOW (ref 6.5–8.1)

## 2018-09-01 LAB — CBC
HCT: 37.8 % (ref 36.0–46.0)
Hemoglobin: 12.2 g/dL (ref 12.0–15.0)
MCH: 29.8 pg (ref 26.0–34.0)
MCHC: 32.3 g/dL (ref 30.0–36.0)
MCV: 92.4 fL (ref 80.0–100.0)
Platelets: 310 10*3/uL (ref 150–400)
RBC: 4.09 MIL/uL (ref 3.87–5.11)
RDW: 13.1 % (ref 11.5–15.5)
WBC: 7.3 10*3/uL (ref 4.0–10.5)
nRBC: 0 % (ref 0.0–0.2)

## 2018-09-01 LAB — SURGICAL PCR SCREEN
MRSA, PCR: NEGATIVE
Staphylococcus aureus: NEGATIVE

## 2018-09-01 LAB — LIPASE, BLOOD: Lipase: 44 U/L (ref 11–51)

## 2018-09-01 SURGERY — LAPAROSCOPIC CHOLECYSTECTOMY WITH INTRAOPERATIVE CHOLANGIOGRAM
Anesthesia: General | Site: Abdomen

## 2018-09-01 MED ORDER — DEXAMETHASONE SODIUM PHOSPHATE 4 MG/ML IJ SOLN
INTRAMUSCULAR | Status: DC | PRN
  Administered 2018-09-01: 5 mg via INTRAVENOUS

## 2018-09-01 MED ORDER — CHLORHEXIDINE GLUCONATE CLOTH 2 % EX PADS: 6.0000 | MEDICATED_PAD | Freq: Once | CUTANEOUS | Status: DC

## 2018-09-01 MED ORDER — LIDOCAINE HCL (CARDIAC) PF 100 MG/5ML IV SOSY
PREFILLED_SYRINGE | INTRAVENOUS | Status: DC | PRN
  Administered 2018-09-01: 60 mg via INTRAVENOUS

## 2018-09-01 MED ORDER — 0.9 % SODIUM CHLORIDE (POUR BTL) OPTIME
TOPICAL | Status: DC | PRN
  Administered 2018-09-01: 1000 mL

## 2018-09-01 MED ORDER — PROPOFOL 10 MG/ML IV BOLUS
INTRAVENOUS | Status: AC
  Filled 2018-09-01: qty 20

## 2018-09-01 MED ORDER — BUPIVACAINE-EPINEPHRINE 0.25% -1:200000 IJ SOLN
INTRAMUSCULAR | Status: DC | PRN
  Administered 2018-09-01: 11 mL

## 2018-09-01 MED ORDER — ONDANSETRON HCL 4 MG/2ML IJ SOLN
INTRAMUSCULAR | Status: DC | PRN
  Administered 2018-09-01: 4 mg via INTRAVENOUS

## 2018-09-01 MED ORDER — FENTANYL CITRATE (PF) 100 MCG/2ML IJ SOLN
25.0000 ug | INTRAMUSCULAR | Status: DC | PRN
  Administered 2018-09-01: 25 ug via INTRAVENOUS
  Administered 2018-09-01: 50 ug via INTRAVENOUS
  Administered 2018-09-01: 25 ug via INTRAVENOUS

## 2018-09-01 MED ORDER — FENTANYL CITRATE (PF) 100 MCG/2ML IJ SOLN
INTRAMUSCULAR | Status: DC | PRN
  Administered 2018-09-01 (×2): 100 ug via INTRAVENOUS
  Administered 2018-09-01: 50 ug via INTRAVENOUS

## 2018-09-01 MED ORDER — KETOROLAC TROMETHAMINE 30 MG/ML IJ SOLN
INTRAMUSCULAR | Status: AC
  Filled 2018-09-01: qty 1

## 2018-09-01 MED ORDER — PROPOFOL 10 MG/ML IV BOLUS
INTRAVENOUS | Status: DC | PRN
  Administered 2018-09-01: 130 mg via INTRAVENOUS

## 2018-09-01 MED ORDER — ROCURONIUM BROMIDE 100 MG/10ML IV SOLN
INTRAVENOUS | Status: DC | PRN
  Administered 2018-09-01: 30 mg via INTRAVENOUS

## 2018-09-01 MED ORDER — FENTANYL CITRATE (PF) 250 MCG/5ML IJ SOLN
INTRAMUSCULAR | Status: AC
  Filled 2018-09-01: qty 5

## 2018-09-01 MED ORDER — SUCCINYLCHOLINE CHLORIDE 200 MG/10ML IV SOSY
PREFILLED_SYRINGE | INTRAVENOUS | Status: DC | PRN
  Administered 2018-09-01: 80 mg via INTRAVENOUS

## 2018-09-01 MED ORDER — SUGAMMADEX SODIUM 200 MG/2ML IV SOLN
INTRAVENOUS | Status: DC | PRN
  Administered 2018-09-01: 136 mg via INTRAVENOUS

## 2018-09-01 MED ORDER — ACETAMINOPHEN 500 MG PO TABS: 1000.0000 mg | ORAL_TABLET | Freq: Once | ORAL | Status: DC | PRN

## 2018-09-01 MED ORDER — ACETAMINOPHEN 500 MG PO TABS: 1000.0000 mg | ORAL_TABLET | ORAL | Status: DC

## 2018-09-01 MED ORDER — OXYCODONE HCL 5 MG PO TABS: 5.0000 mg | ORAL_TABLET | Freq: Once | ORAL | Status: DC | PRN

## 2018-09-01 MED ORDER — BUPIVACAINE-EPINEPHRINE (PF) 0.25% -1:200000 IJ SOLN
INTRAMUSCULAR | Status: AC
  Filled 2018-09-01: qty 30

## 2018-09-01 MED ORDER — ACETAMINOPHEN 10 MG/ML IV SOLN: 1000.0000 mg | Freq: Once | INTRAVENOUS | Status: DC | PRN

## 2018-09-01 MED ORDER — GABAPENTIN 300 MG PO CAPS: 300.0000 mg | ORAL_CAPSULE | ORAL | Status: DC

## 2018-09-01 MED ORDER — CEFAZOLIN SODIUM-DEXTROSE 2-4 GM/100ML-% IV SOLN
INTRAVENOUS | Status: AC
  Filled 2018-09-01: qty 100

## 2018-09-01 MED ORDER — GLYCOPYRROLATE 0.2 MG/ML IJ SOLN
INTRAMUSCULAR | Status: DC | PRN
  Administered 2018-09-01: 0.2 mg via INTRAVENOUS

## 2018-09-01 MED ORDER — CEFAZOLIN SODIUM-DEXTROSE 2-4 GM/100ML-% IV SOLN
2.0000 g | INTRAVENOUS | Status: AC
  Administered 2018-09-01: 12:00:00 2 g via INTRAVENOUS

## 2018-09-01 MED ORDER — ACETAMINOPHEN 160 MG/5ML PO SOLN: 1000.0000 mg | Freq: Once | ORAL | Status: DC | PRN

## 2018-09-01 MED ORDER — SUCCINYLCHOLINE CHLORIDE 20 MG/ML IJ SOLN
INTRAMUSCULAR | Status: DC | PRN
  Administered 2018-09-01: 60 mg via INTRAVENOUS

## 2018-09-01 MED ORDER — SODIUM CHLORIDE 0.9 % IR SOLN
Status: DC | PRN
  Administered 2018-09-01: 1000 mL

## 2018-09-01 MED ORDER — FENTANYL CITRATE (PF) 100 MCG/2ML IJ SOLN
INTRAMUSCULAR | Status: AC
  Administered 2018-09-01: 50 ug via INTRAVENOUS
  Filled 2018-09-01: qty 2

## 2018-09-01 MED ORDER — PHENYLEPHRINE HCL (PRESSORS) 10 MG/ML IV SOLN
INTRAVENOUS | Status: DC | PRN
  Administered 2018-09-01: 80 ug via INTRAVENOUS

## 2018-09-01 MED ORDER — PHENOL 1.4 % MT LIQD
1.0000 | OROMUCOSAL | Status: DC | PRN
  Administered 2018-09-01: 1 via OROMUCOSAL
  Filled 2018-09-01: qty 177

## 2018-09-01 MED ORDER — OXYCODONE HCL 5 MG/5ML PO SOLN: 5.0000 mg | Freq: Once | ORAL | Status: DC | PRN

## 2018-09-01 MED ORDER — LACTATED RINGERS IV SOLN
INTRAVENOUS | Status: DC
  Administered 2018-09-01 (×2): via INTRAVENOUS

## 2018-09-01 SURGICAL SUPPLY — 40 items
APPLIER CLIP 5 13 M/L LIGAMAX5 (MISCELLANEOUS) ×3
BLADE CLIPPER SURG (BLADE) IMPLANT
CANISTER SUCT 3000ML PPV (MISCELLANEOUS) ×3 IMPLANT
CHLORAPREP W/TINT 26ML (MISCELLANEOUS) ×3 IMPLANT
CLIP APPLIE 5 13 M/L LIGAMAX5 (MISCELLANEOUS) ×1 IMPLANT
COVER MAYO STAND STRL (DRAPES) ×3 IMPLANT
COVER SURGICAL LIGHT HANDLE (MISCELLANEOUS) ×3 IMPLANT
COVER WAND RF STERILE (DRAPES) ×3 IMPLANT
DERMABOND ADVANCED (GAUZE/BANDAGES/DRESSINGS) ×2
DERMABOND ADVANCED .7 DNX12 (GAUZE/BANDAGES/DRESSINGS) ×1 IMPLANT
DISSECTOR BLUNT TIP ENDO 5MM (MISCELLANEOUS) IMPLANT
DRAPE C-ARM 42X72 X-RAY (DRAPES) ×3 IMPLANT
ELECT CAUTERY BLADE 6.4 (BLADE) ×3 IMPLANT
ELECT REM PT RETURN 9FT ADLT (ELECTROSURGICAL) ×3
ELECTRODE REM PT RTRN 9FT ADLT (ELECTROSURGICAL) ×1 IMPLANT
GLOVE BIOGEL M STRL SZ7.5 (GLOVE) ×3 IMPLANT
GLOVE INDICATOR 8.0 STRL GRN (GLOVE) ×3 IMPLANT
GOWN STRL REUS W/ TWL LRG LVL3 (GOWN DISPOSABLE) ×2 IMPLANT
GOWN STRL REUS W/ TWL XL LVL3 (GOWN DISPOSABLE) ×1 IMPLANT
GOWN STRL REUS W/TWL LRG LVL3 (GOWN DISPOSABLE) ×4
GOWN STRL REUS W/TWL XL LVL3 (GOWN DISPOSABLE) ×2
KIT BASIN OR (CUSTOM PROCEDURE TRAY) ×3 IMPLANT
KIT TURNOVER KIT B (KITS) ×3 IMPLANT
NS IRRIG 1000ML POUR BTL (IV SOLUTION) ×3 IMPLANT
PAD ARMBOARD 7.5X6 YLW CONV (MISCELLANEOUS) ×3 IMPLANT
PENCIL SMOKE EVACUATOR (MISCELLANEOUS) ×3 IMPLANT
POUCH SPECIMEN RETRIEVAL 10MM (ENDOMECHANICALS) IMPLANT
SCISSORS LAP 5X35 DISP (ENDOMECHANICALS) ×3 IMPLANT
SET CHOLANGIOGRAPH 5 50 .035 (SET/KITS/TRAYS/PACK) ×3 IMPLANT
SET IRRIG TUBING LAPAROSCOPIC (IRRIGATION / IRRIGATOR) ×3 IMPLANT
SET TUBE SMOKE EVAC HIGH FLOW (TUBING) ×3 IMPLANT
SLEEVE ENDOPATH XCEL 5M (ENDOMECHANICALS) ×6 IMPLANT
SPECIMEN JAR SMALL (MISCELLANEOUS) ×3 IMPLANT
SUT MNCRL AB 4-0 PS2 18 (SUTURE) ×3 IMPLANT
TOWEL OR 17X24 6PK STRL BLUE (TOWEL DISPOSABLE) ×3 IMPLANT
TOWEL OR 17X26 10 PK STRL BLUE (TOWEL DISPOSABLE) ×3 IMPLANT
TRAY LAPAROSCOPIC MC (CUSTOM PROCEDURE TRAY) ×3 IMPLANT
TROCAR XCEL BLUNT TIP 100MML (ENDOMECHANICALS) ×3 IMPLANT
TROCAR XCEL NON-BLD 5MMX100MML (ENDOMECHANICALS) ×3 IMPLANT
WATER STERILE IRR 1000ML POUR (IV SOLUTION) ×3 IMPLANT

## 2018-09-01 NOTE — Anesthesia Postprocedure Evaluation (Signed)
Anesthesia Post Note  Patient: Crystal Patrick  Procedure(s) Performed: LAPAROSCOPIC CHOLECYSTECTOMY WITH INTRAOPERATIVE CHOLANGIOGRAM (N/A Abdomen)     Patient location during evaluation: PACU Anesthesia Type: General Level of consciousness: awake and alert Pain management: pain level controlled Vital Signs Assessment: post-procedure vital signs reviewed and stable Respiratory status: spontaneous breathing, nonlabored ventilation, respiratory function stable and patient connected to nasal cannula oxygen Cardiovascular status: blood pressure returned to baseline and stable Postop Assessment: no apparent nausea or vomiting Anesthetic complications: no    Last Vitals:  Vitals:   09/01/18 1345 09/01/18 1425  BP: (!) 141/63 133/65  Pulse: 84 76  Resp: 15 18  Temp:  37.1 C  SpO2: 97% 97%    Last Pain:  Vitals:   09/01/18 1425  TempSrc: Oral  PainSc:                  Neilson Oehlert

## 2018-09-01 NOTE — Transfer of Care (Signed)
Immediate Anesthesia Transfer of Care Note  Patient: Crystal Patrick  Procedure(s) Performed: LAPAROSCOPIC CHOLECYSTECTOMY WITH INTRAOPERATIVE CHOLANGIOGRAM (N/A Abdomen)  Patient Location: PACU  Anesthesia Type:General  Level of Consciousness: awake, alert  and oriented  Airway & Oxygen Therapy: Patient Spontanous Breathing and Patient connected to face mask oxygen  Post-op Assessment: Report given to RN, Post -op Vital signs reviewed and stable and Patient moving all extremities X 4  Post vital signs: Reviewed and stable  Last Vitals:  Vitals Value Taken Time  BP    Temp    Pulse 86 09/01/2018 12:56 PM  Resp 16 09/01/2018 12:56 PM  SpO2 100 % 09/01/2018 12:56 PM  Vitals shown include unvalidated device data.  Last Pain:  Vitals:   09/01/18 0900  TempSrc:   PainSc: 0-No pain      Patients Stated Pain Goal: 0 (88/91/69 4503)  Complications: No apparent anesthesia complications

## 2018-09-01 NOTE — Anesthesia Preprocedure Evaluation (Signed)
Anesthesia Evaluation  Patient identified by MRN, date of birth, ID band Patient awake    Reviewed: Allergy & Precautions, NPO status , Patient's Chart, lab work & pertinent test results  History of Anesthesia Complications Negative for: history of anesthetic complications  Airway Mallampati: II  TM Distance: >3 FB Neck ROM: Full    Dental  (+) Teeth Intact, Dental Advisory Given   Pulmonary neg pulmonary ROS,    breath sounds clear to auscultation       Cardiovascular hypertension,  Rhythm:Regular     Neuro/Psych PSYCHIATRIC DISORDERS Anxiety Depression negative neurological ROS     GI/Hepatic Neg liver ROS, hiatal hernia, PUD, GERD  Medicated and Controlled,cholecystitis   Endo/Other  negative endocrine ROS  Renal/GU negative Renal ROS     Musculoskeletal  (+) Arthritis ,   Abdominal   Peds  Hematology   Anesthesia Other Findings   Reproductive/Obstetrics                             Anesthesia Physical Anesthesia Plan  ASA: II  Anesthesia Plan: General   Post-op Pain Management:    Induction: Intravenous and Rapid sequence  PONV Risk Score and Plan: 3 and Ondansetron and Dexamethasone  Airway Management Planned: Oral ETT  Additional Equipment: None  Intra-op Plan:   Post-operative Plan: Extubation in OR  Informed Consent: I have reviewed the patients History and Physical, chart, labs and discussed the procedure including the risks, benefits and alternatives for the proposed anesthesia with the patient or authorized representative who has indicated his/her understanding and acceptance.     Dental advisory given  Plan Discussed with: CRNA and Surgeon  Anesthesia Plan Comments:         Anesthesia Quick Evaluation

## 2018-09-01 NOTE — Progress Notes (Signed)
Subjective/Chief Complaint: Doing well today. Denies abdominal pain/n/v.  US showed stones with mild pericholecystic fluid. Repeat US showed: IMPRESSION: 1. Contracted gallbladder with gallstones and wall thickening, similar in appearance to prior exam. Findings suggest chronic cholecystitis. Negative sonographic Murphy sign biliary dilatation. 2. Mild hepatic steatosis.   Objective: Vital signs in last 24 hours: Temp:  [97.4 F (36.3 C)-98.1 F (36.7 C)] 98 F (36.7 C) (04/12 0617) Pulse Rate:  [74-77] 77 (04/12 0617) Resp:  [18-20] 18 (04/12 0617) BP: (134-158)/(63-75) 134/63 (04/12 0617) SpO2:  [97 %-98 %] 98 % (04/12 0617) Last BM Date: (PTA)  Intake/Output from previous day: 04/11 0701 - 04/12 0700 In: 636.1 [I.V.:636.1] Out: -  Intake/Output this shift: No intake/output data recorded.  cv rrr Lungs clear abd is soft, NT/ND  Lab Results:  Recent Labs    08/31/18 0802 09/01/18 0310  WBC 5.0 7.3  HGB 11.8* 12.2  HCT 37.1 37.8  PLT 299 310   BMET Recent Labs    08/31/18 0802 09/01/18 0310  NA 140 138  K 3.6 3.9  CL 106 105  CO2 24 18*  GLUCOSE 95 59*  BUN 12 8  CREATININE 0.78 0.76  CALCIUM 8.5* 9.1   PT/INR Recent Labs    08/31/18 0802  LABPROT 13.2  INR 1.0   ABG No results for input(s): PHART, HCO3 in the last 72 hours.  Invalid input(s): PCO2, PO2  Studies/Results: Mr Abdomen Mrcp Wo Contrast  Result Date: 08/31/2018 CLINICAL DATA:  Abdominal pain.  Suspected atypical pancreatitis. EXAM: MRI ABDOMEN WITHOUT CONTRAST  (INCLUDING MRCP) TECHNIQUE: Multiplanar multisequence MR imaging of the abdomen was performed. Heavily T2-weighted images of the biliary and pancreatic ducts were obtained, and three-dimensional MRCP images were rendered by post processing. COMPARISON:  Ultrasound 08/31/2018.  CT 11/19/2013. FINDINGS: Lower chest:  The visualized lower chest appears unremarkable. Hepatobiliary: The liver is normal in signal without  significant steatosis or focal abnormality on noncontrast imaging. As demonstrated on earlier ultrasound, there is chronic gallbladder wall thickening and cholelithiasis. There was no sonographic Murphy sign on ultrasound. There is no biliary dilatation or evidence of choledocholithiasis. Pancreas: Unremarkable. No pancreatic ductal dilatation or surrounding inflammatory changes. No pancreas divisum. Spleen: Normal in size without focal abnormality. Adrenals/Urinary Tract: Both adrenal glands appear normal. The kidneys and ureters appear normal. No hydronephrosis. Stomach/Bowel: No evidence of bowel wall thickening, distention or surrounding inflammatory change. Vascular/Lymphatic: There are no enlarged abdominal lymph nodes. No significant vascular findings. Other: No ascites. Musculoskeletal: No acute or significant osseous findings. Mild lumbar spondylosis. IMPRESSION: 1. The pancreas appears normal. 2. Cholelithiasis and chronic gallbladder wall thickening suggesting chronic cholecystitis as correlated with previous ultrasound. No biliary dilatation. Electronically Signed   By: Richardean Sale M.D.   On: 08/31/2018 13:04   Mr 3d Recon At Scanner  Result Date: 08/31/2018 CLINICAL DATA:  Abdominal pain.  Suspected atypical pancreatitis. EXAM: MRI ABDOMEN WITHOUT CONTRAST  (INCLUDING MRCP) TECHNIQUE: Multiplanar multisequence MR imaging of the abdomen was performed. Heavily T2-weighted images of the biliary and pancreatic ducts were obtained, and three-dimensional MRCP images were rendered by post processing. COMPARISON:  Ultrasound 08/31/2018.  CT 11/19/2013. FINDINGS: Lower chest:  The visualized lower chest appears unremarkable. Hepatobiliary: The liver is normal in signal without significant steatosis or focal abnormality on noncontrast imaging. As demonstrated on earlier ultrasound, there is chronic gallbladder wall thickening and cholelithiasis. There was no sonographic Murphy sign on ultrasound. There is  no biliary dilatation or evidence of choledocholithiasis. Pancreas: Unremarkable.  No pancreatic ductal dilatation or surrounding inflammatory changes. No pancreas divisum. Spleen: Normal in size without focal abnormality. Adrenals/Urinary Tract: Both adrenal glands appear normal. The kidneys and ureters appear normal. No hydronephrosis. Stomach/Bowel: No evidence of bowel wall thickening, distention or surrounding inflammatory change. Vascular/Lymphatic: There are no enlarged abdominal lymph nodes. No significant vascular findings. Other: No ascites. Musculoskeletal: No acute or significant osseous findings. Mild lumbar spondylosis. IMPRESSION: 1. The pancreas appears normal. 2. Cholelithiasis and chronic gallbladder wall thickening suggesting chronic cholecystitis as correlated with previous ultrasound. No biliary dilatation. Electronically Signed   By: Richardean Sale M.D.   On: 08/31/2018 13:04   US Abdomen Limited  Result Date: 08/31/2018 CLINICAL DATA:  Right upper quadrant epigastric pain tonight. Known gallstones. Elevated lipase. EXAM: ULTRASOUND ABDOMEN LIMITED RIGHT UPPER QUADRANT COMPARISON:  Ultrasound 07/30/2018 FINDINGS: Gallbladder: Contracted with gallstones and gallbladder wall thickening, wall thicknesses of 6-12mm. No pericholecystic fluid. No sonographic Murphy sign noted by sonographer. Common bile duct: Diameter: 4-6 mm, normal. Liver: No focal lesion identified. Heterogeneous and slightly increased parenchymal echogenicity. Portal vein is patent on color Doppler imaging with normal direction of blood flow towards the liver. IMPRESSION: 1. Contracted gallbladder with gallstones and wall thickening, similar in appearance to prior exam. Findings suggest chronic cholecystitis. Negative sonographic Murphy sign biliary dilatation. 2. Mild hepatic steatosis. Electronically Signed   By: Keith Rake M.D.   On: 08/31/2018 03:14    Anti-infectives: Anti-infectives (From admission, onward)    None      Assessment/Plan: Chronic calculous cholecystitis Mild GSP -Lipase normalized; abdominal pain resolved -Possible OR later today for lap chole as timing allows -The anatomy and physiology of the hepatobiliary system was discussed at length with the patient with associated pictures. The pathophysiology of gallbladder disease was discussed at length as well. -The options for treatment were discussed including ongoing observation which may result in subsequent gallbladder complications and recurrent pancreatitis (infection, pancreatitis, choledocholithiasis, etc). -The procedure, material risks (including, but not limited to, pain, bleeding, infection, scarring, need for blood transfusion, damage to surrounding structures- blood vessels/nerves/viscus/organs, damage to bile duct, bile leak, need for additional procedures, DVT/PE, hernia, worsening of pre-existing medical conditions, pancreatitis, pneumonia, heart attack, stroke, death) benefits and alternatives to surgery were discussed at length. I noted a good probability that the procedure would help improve their symptoms (50-90%). The patient's questions were answered to her satisfaction, she voiced understanding and they elected to proceed with surgery. Additionally, we discussed typical postoperative expectations and the recovery process.  Sharon Mt Rasool Rommel 09/01/2018

## 2018-09-01 NOTE — Progress Notes (Signed)
I attempted to reach her husband Legrand Como Shanon Brow) to let him know we finished with her case and she did well. She cannot remember his number and the number listed in the chart, someone answers and immediately hangs up ([phone]#).  Sharon Mt. Dema Severin, M.D. [facility]

## 2018-09-01 NOTE — Op Note (Signed)
09/01/2018 12:43 PM  PATIENT: Crystal Patrick  73 y.o. female  Patient Care Team: [name], DO as PCP - General (Family Medicine) [name], MD as Consulting Physician (Psychiatry) Rutherford Guys, MD as Consulting Physician (Ophthalmology) Druscilla Brownie, MD as Consulting Physician (Dermatology)  PRE-OPERATIVE DIAGNOSIS: Gallstone pancreatitis, hx of chronic cholecystitis  POST-OPERATIVE DIAGNOSIS: Same  PROCEDURE: Laparoscopic cholecystectomy  SURGEON: Sharon Mt. Dema Severin, MD  ASSISTANT: Michael Boston, MD  ANESTHESIA: General endotracheal  EBL: Total I/O In: 76 [I.V.:800] Out: 10 [Blood:10]  DRAINS: None  SPECIMEN: Gallbladder  COUNTS: Sponge, needle and instrument counts were reported correct x2 at the conclusion of the operation  DISPOSITION: PACU in satisfactory condition  COMPLICATIONS: None  FINDINGS: Some chronic cholecystitis - omentum adherent to gallbladder. Critical view of safety was obtained prior to clipping or dividing any structures.  A posterior cystic artery was noted running high up onto the gallbladder and also clipped and ligated.  Relatively short cystic duct with now normal LFTs preoperatively.  Her lipase also normalized.  For these reasons, a cholangiogram was not performed so as to reduce the chance of a possible backwall injury to the common bile duct during attempted cholangiogram  INDICATION: Ms. Yordy is a very pleasant 83yoF with hx of possible chronic cholecystitis who was originally scheduled for surgery at the end of March but this was canceled due to the COVID-19 outbreak and planned for a later date.  She however presented to the emergency room 08/31/2018 with findings consistent with gallstone pancreatitis.  Given this, options were discussed moving forward.  She was admitted to the hospital and monitored.  On 09/01/2018, her lipase had normalized and she had resolution of her abdominal pain.  For these reasons, options were  discussed and she opted to pursue surgery.  Please refer to notes elsewhere for details regarding this discussion  DESCRIPTION:   The patient was identified & brought into the operating room. She was then positioned supine on the OR table. SCDs were in place and active during the entire case. She then underwent general endotracheal anesthesia. Pressure points were padded. Hair on the abdomen was clipped by the OR team. The abdomen was prepped and draped in the standard sterile fashion. Antibiotics were administered. A surgical timeout was performed and confirmed our plan.   A periumbilical incision was made. The umbilical stalk was grasped and retracted outwardly. The supraumbilical fascia was identified and incised. The peritoneal cavity was gently entered bluntly. A purse-string 0 Vicryl suture was placed. The Hasson cannula was inserted into the peritoneal cavity and insufflation with CO2 commenced to 70mmHg. A laparoscope was inserted into the peritoneal cavity and inspection confirmed no evidence of trocar site complications. Adhesions were noted in the right lower quadrant of omentum and left alone as they were not obstructing the view for the procedure or in the way for instrument exchanges. The patient was then positioned in reverse Trendelenburg with slight left side down. 3 additional 12mm trocars were placed along the right subcostal line - one 88mm port in mid subcostal region, another 22mm port in the right flank near the anterior axillary line, and a third 75mm port in the left subxiphoid region obliquely near the falciform ligament.  The liver and gallbladder were inspected.  Omental adhesions to the gallbladder were carefully taken down sharply. The gallbladder fundus was grasped and elevated cephalad. An additional grasper was then placed on the infundibulum of the gallbladder and the infundibulum was retracted laterally. Staying high on the  gallbladder, the peritoneum on both sides of the  gallbladder was opened with hook cautery. Gentle blunt dissection was then employed with a IT consultant working down into [employer]. The cystic duct was identified and carefully circumferentially dissected. The cystic artery was also identified and carefully circumferentially dissected. The space between the cystic artery and hepatocystic plate was developed such that a good view of the liver could be seen through a window medial to the cystic artery. The triangle of Calot had been cleared of all fibrofatty tissue. At this point, a critical view of safety was achieved and the only structures visualized was the skeletonized cystic duct laterally, the skeletonized cystic artery and the liver through the window medial to the artery. A posterior cystic artery was noted which gave off two branches to the gallbladder. These were controlled with clips as well. This was high on the gallbladder as well.  A cholangiogram was not performed at this point as she had a relatively short cystic duct with now normal LFTs preoperatively.  Her lipase also normalized.  For these reasons, a cholangiogram was not performed so as to reduce the chance of a possible backwall injury to the common bile duct during attempted cholangiogram  The cystic duct and artery were clipped with 2 clips on the patient side and 1 clip on the specimen side. The cystic duct and artery were then divided. The gallbladder was then freed from its remaining attachments to the liver using electrocautery and placed into an endocatch bag. The RUQ was gently irrigated with sterile saline. Hemostasis was then verified. The clips were in good position; the gallbladder fossa was dry. The rest of the abdomen was inspected no injury nor bleeding elsewhere was identified.  The endocatch bag containing the gallbladder was then removed from the umbilical port site and passed off as specimen. Pneumoperitoneum was then exhausted. The RUQ ports were removed  under direct visualization and noted to be hemostatic. The umbilical fascia was then closed using the 0 Vicryl purse-string suture. The fascia was palpated and noted to be completely closed. The skin of all incision sites was approximated with 4-0 monocryl subcuticular suture and dermabond applied. She was then awakened from anesthesia, extubated, and transferred to a stretcher for transport to PACU in satisfactory condition.

## 2018-09-01 NOTE — Progress Notes (Signed)
Pt with a new complaint of a sore throat that started overnight. On Call provider was notified. Awaiting further orders. Will continue to monitor pt.

## 2018-09-01 NOTE — Progress Notes (Signed)
PROGRESS NOTE                                                                                                                                                                                                             Patient Demographics:    Crystal Patrick, is a 73 y.o. female, DOB - August 14, 1945, ZDG:[id number]  Admit date - 08/31/2018   Admitting Physician Shela Leff, MD  Outpatient Primary MD for the patient is [name], DO  LOS - 0  Chief Complaint  Patient presents with  . Abdominal Pain       Brief Narrative  Crystal SUNGA is a 73 y.o. female with medical history significant of cholelithiasis, peptic ulcer disease, GERD, hiatal hernia, prior appendectomy, hypertension, hyperlipidemia, anxiety, depression presenting to the hospital for evaluation of epigastric abdominal pain work-up suggestive of acute on chronic cholecystitis with possible gallstone pancreatitis.   Subjective:    Patient in bed, appears comfortable, denies any headache, no fever, no chest pain or pressure, no shortness of breath , no abdominal pain. No focal weakness.      Assessment  & Plan :     1.  Acute on chronic cholecystitis with mild reactive pancreatitis versus nonspecific elevation of lipase.  Consistent with chronic cholecystitis, she was scheduled to have outpatient cholecystectomy but could not get it done, RCP shows clean CBD, seen by general surgery, continue bowel rest IV fluids and pain control.  Plan is for lap chole either late on 09/01/2018 or early 09/02/2018.  Lipase has normalized.  She is now pain-free.  2.  Hypokalemia.  Replaced and stable.  3.  Essential hypertension.  PRN hydralazine.    Family Communication  :  None  Code Status :  Full  Disposition Plan  :  TBD  Consults  :  CCS  Procedures  :    [place] -  1. Contracted gallbladder with gallstones and wall thickening, similar in appearance to prior exam. Findings suggest chronic cholecystitis. Negative  sonographic Murphy sign biliary dilatation. 2. Mild hepatic steatosis.  MRCP -   DVT Prophylaxis  :   Heparin   Lab Results  Component Value Date   PLT 310 09/01/2018    Diet :  Diet Order            Diet NPO time specified  Diet effective now               Inpatient Medications Scheduled Meds: . heparin  5,000 Units  Subcutaneous Q8H  . sodium chloride flush  3 mL Intravenous Once   Continuous Infusions: . lactated ringers with kcl 75 mL/hr at 08/31/18 2040   PRN Meds:.acetaminophen **OR** acetaminophen, hydrALAZINE, LORazepam, morphine injection, ondansetron (ZOFRAN) IV  Antibiotics  :   Anti-infectives (From admission, onward)   None       Objective:   Vitals:   08/31/18 0655 08/31/18 1359 08/31/18 2110 09/01/18 0617  BP: (!) 153/72 (!) 158/67 135/75 134/63  Pulse:  74 77 77  Resp:  18 20 18   Temp:  (!) 97.4 F (36.3 C) 98.1 F (36.7 C) 98 F (36.7 C)  TempSrc:  Oral    SpO2:  97% 97% 98%  Weight:      Height:        Wt Readings from Last 3 Encounters:  08/31/18 67.8 kg  07/30/18 68 kg  02/05/18 71.8 kg     Intake/Output Summary (Last 24 hours) at 09/01/2018 0850 Last data filed at 08/31/2018 1557 Gross per 24 hour  Intake 636.12 ml  Output -  Net 636.12 ml     Physical Exam  Awake Alert, Oriented X 3, No new F.N deficits, Normal affect [place].AT,PERRAL Supple Neck,No JVD, No cervical lymphadenopathy appriciated.  Symmetrical Chest wall movement, Good air movement bilaterally, CTAB RRR,No Gallops,Rubs or new Murmurs, No Parasternal Heave +ve B.Sounds, Abd Soft, no abdominal tenderness, No organomegaly appriciated, No rebound - guarding or rigidity. No Cyanosis, Clubbing or edema, No new Rash or bruise       Data Review:    CBC Recent Labs  Lab 08/31/18 0051 08/31/18 0802 09/01/18 0310  WBC 7.0 5.0 7.3  HGB 13.1 11.8* 12.2  HCT 40.7 37.1 37.8  PLT 350 299 310  MCV 91.9 91.8 92.4  MCH 29.6 29.2 29.8  MCHC 32.2 31.8 32.3  RDW  12.9 12.9 13.1    Chemistries  Recent Labs  Lab 08/31/18 0051 08/31/18 0802 09/01/18 0310  NA 138 140 138  K 3.4* 3.6 3.9  CL 101 106 105  CO2 24 24 18*  GLUCOSE 112* 95 59*  BUN 15 12 8   CREATININE 0.84 0.78 0.76  CALCIUM 9.3 8.5* 9.1  MG 2.1  --   --   AST 105* 192* 69*  ALT 93* 144* 112*  ALKPHOS 86 81 70  BILITOT 1.0 1.1 1.1   ------------------------------------------------------------------------------------------------------------------ Recent Labs    08/31/18 0802  CHOL 153  HDL 54  LDLCALC 91  TRIG 42  CHOLHDL 2.8    Lab Results  Component Value Date   HGBA1C 6.0 02/05/2018   ------------------------------------------------------------------------------------------------------------------ No results for input(s): TSH, T4TOTAL, T3FREE, THYROIDAB in the last 72 hours.  Invalid input(s): FREET3 ------------------------------------------------------------------------------------------------------------------ No results for input(s): VITAMINB12, FOLATE, FERRITIN, TIBC, IRON, RETICCTPCT in the last 72 hours.  Coagulation profile Recent Labs  Lab 08/31/18 0802  INR 1.0    No results for input(s): DDIMER in the last 72 hours.  Cardiac Enzymes No results for input(s): CKMB, TROPONINI, MYOGLOBIN in the last 168 hours.  Invalid input(s): CK ------------------------------------------------------------------------------------------------------------------ No results found for: BNP  Micro Results No results found for this or any previous visit (from the past 240 hour(s)).  Radiology Reports Mr Abdomen Mrcp Wo Contrast  Result Date: 08/31/2018 CLINICAL DATA:  Abdominal pain.  Suspected atypical pancreatitis. EXAM: MRI ABDOMEN WITHOUT CONTRAST  (INCLUDING MRCP) TECHNIQUE: Multiplanar multisequence MR imaging of the abdomen was performed. Heavily T2-weighted images of the biliary and pancreatic ducts were obtained, and three-dimensional MRCP images were  rendered by post processing. COMPARISON:  Ultrasound 08/31/2018.  CT 11/19/2013. FINDINGS: Lower chest:  The visualized lower chest appears unremarkable. Hepatobiliary: The liver is normal in signal without significant steatosis or focal abnormality on noncontrast imaging. As demonstrated on earlier ultrasound, there is chronic gallbladder wall thickening and cholelithiasis. There was no sonographic Murphy sign on ultrasound. There is no biliary dilatation or evidence of choledocholithiasis. Pancreas: Unremarkable. No pancreatic ductal dilatation or surrounding inflammatory changes. No pancreas divisum. Spleen: Normal in size without focal abnormality. Adrenals/Urinary Tract: Both adrenal glands appear normal. The kidneys and ureters appear normal. No hydronephrosis. Stomach/Bowel: No evidence of bowel wall thickening, distention or surrounding inflammatory change. Vascular/Lymphatic: There are no enlarged abdominal lymph nodes. No significant vascular findings. Other: No ascites. Musculoskeletal: No acute or significant osseous findings. Mild lumbar spondylosis. IMPRESSION: 1. The pancreas appears normal. 2. Cholelithiasis and chronic gallbladder wall thickening suggesting chronic cholecystitis as correlated with previous ultrasound. No biliary dilatation. Electronically Signed   By: Richardean Sale M.D.   On: 08/31/2018 13:04   Mr 3d Recon At Scanner  Result Date: 08/31/2018 CLINICAL DATA:  Abdominal pain.  Suspected atypical pancreatitis. EXAM: MRI ABDOMEN WITHOUT CONTRAST  (INCLUDING MRCP) TECHNIQUE: Multiplanar multisequence MR imaging of the abdomen was performed. Heavily T2-weighted images of the biliary and pancreatic ducts were obtained, and three-dimensional MRCP images were rendered by post processing. COMPARISON:  Ultrasound 08/31/2018.  CT 11/19/2013. FINDINGS: Lower chest:  The visualized lower chest appears unremarkable. Hepatobiliary: The liver is normal in signal without significant steatosis or  focal abnormality on noncontrast imaging. As demonstrated on earlier ultrasound, there is chronic gallbladder wall thickening and cholelithiasis. There was no sonographic Murphy sign on ultrasound. There is no biliary dilatation or evidence of choledocholithiasis. Pancreas: Unremarkable. No pancreatic ductal dilatation or surrounding inflammatory changes. No pancreas divisum. Spleen: Normal in size without focal abnormality. Adrenals/Urinary Tract: Both adrenal glands appear normal. The kidneys and ureters appear normal. No hydronephrosis. Stomach/Bowel: No evidence of bowel wall thickening, distention or surrounding inflammatory change. Vascular/Lymphatic: There are no enlarged abdominal lymph nodes. No significant vascular findings. Other: No ascites. Musculoskeletal: No acute or significant osseous findings. Mild lumbar spondylosis. IMPRESSION: 1. The pancreas appears normal. 2. Cholelithiasis and chronic gallbladder wall thickening suggesting chronic cholecystitis as correlated with previous ultrasound. No biliary dilatation. Electronically Signed   By: Richardean Sale M.D.   On: 08/31/2018 13:04   US Abdomen Limited  Result Date: 08/31/2018 CLINICAL DATA:  Right upper quadrant epigastric pain tonight. Known gallstones. Elevated lipase. EXAM: ULTRASOUND ABDOMEN LIMITED RIGHT UPPER QUADRANT COMPARISON:  Ultrasound 07/30/2018 FINDINGS: Gallbladder: Contracted with gallstones and gallbladder wall thickening, wall thicknesses of 6-30mm. No pericholecystic fluid. No sonographic Murphy sign noted by sonographer. Common bile duct: Diameter: 4-6 mm, normal. Liver: No focal lesion identified. Heterogeneous and slightly increased parenchymal echogenicity. Portal vein is patent on color Doppler imaging with normal direction of blood flow towards the liver. IMPRESSION: 1. Contracted gallbladder with gallstones and wall thickening, similar in appearance to prior exam. Findings suggest chronic cholecystitis. Negative  sonographic Murphy sign biliary dilatation. 2. Mild hepatic steatosis. Electronically Signed   By: Keith Rake M.D.   On: 08/31/2018 03:14    Time Spent in minutes  30   Lala Lund M.D on 09/01/2018 at 8:50 AM  To page go to [email or web address] - password [email or web address]

## 2018-09-01 NOTE — Care Management Obs Status (Signed)
MEDICARE OBSERVATION STATUS NOTIFICATION   Patient Details  Name: Crystal Patrick MRN: [id number] Date of Birth: 11-Apr-1946   Medicare Observation Status Notification Given:  Yes  CSW will email copy of OBS letter to patients spouse. Spouse received information and acknowledged.    Toney Lizaola, LCSW 09/01/2018, 9:07 AM

## 2018-09-01 NOTE — Anesthesia Procedure Notes (Signed)
Procedure Name: Intubation Date/Time: 09/01/2018 11:34 AM Performed by: Oletta Lamas, CRNA Pre-anesthesia Checklist: Patient identified, Emergency Drugs available, Suction available and Patient being monitored Patient Re-evaluated:Patient Re-evaluated prior to induction Oxygen Delivery Method: Circle System Utilized Preoxygenation: Pre-oxygenation with 100% oxygen Induction Type: IV induction Laryngoscope Size: Mac and 3 Grade View: Grade II Tube type: Oral Tube size: 7.0 mm Number of attempts: 1 Airway Equipment and Method: Stylet Placement Confirmation: ETT inserted through vocal cords under direct vision,  positive ETCO2 and breath sounds checked- equal and bilateral Secured at: 22 cm Tube secured with: Tape Dental Injury: Teeth and Oropharynx as per pre-operative assessment

## 2018-09-02 ENCOUNTER — Encounter (HOSPITAL_COMMUNITY): Payer: Self-pay | Admitting: Surgery

## 2018-09-02 DIAGNOSIS — E876 Hypokalemia: Secondary | ICD-10-CM

## 2018-09-02 DIAGNOSIS — K8010 Calculus of gallbladder with chronic cholecystitis without obstruction: Secondary | ICD-10-CM

## 2018-09-02 DIAGNOSIS — I10 Essential (primary) hypertension: Secondary | ICD-10-CM

## 2018-09-02 LAB — COMPREHENSIVE METABOLIC PANEL
ALT: 109 U/L — ABNORMAL HIGH (ref 0–44)
AST: 87 U/L — ABNORMAL HIGH (ref 15–41)
Albumin: 3.2 g/dL — ABNORMAL LOW (ref 3.5–5.0)
Alkaline Phosphatase: 106 U/L (ref 38–126)
Anion gap: 12 (ref 5–15)
BUN: 13 mg/dL (ref 8–23)
CO2: 22 mmol/L (ref 22–32)
Calcium: 9 mg/dL (ref 8.9–10.3)
Chloride: 103 mmol/L (ref 98–111)
Creatinine, Ser: 0.79 mg/dL (ref 0.44–1.00)
GFR calc Af Amer: >60 mL/min (ref >60)
GFR calc non Af Amer: >60 mL/min (ref >60)
Glucose, Bld: 102 mg/dL — ABNORMAL HIGH (ref 70–99)
Potassium: 4 mmol/L (ref 3.5–5.1)
Sodium: 137 mmol/L (ref 135–145)
Total Bilirubin: 0.7 mg/dL (ref 0.3–1.2)
Total Protein: 5.6 g/dL — ABNORMAL LOW (ref 6.5–8.1)

## 2018-09-02 LAB — CBC
HCT: 33.5 % — ABNORMAL LOW (ref 36.0–46.0)
Hemoglobin: 10.9 g/dL — ABNORMAL LOW (ref 12.0–15.0)
MCH: 29.6 pg (ref 26.0–34.0)
MCHC: 32.5 g/dL (ref 30.0–36.0)
MCV: 91 fL (ref 80.0–100.0)
Platelets: 278 10*3/uL (ref 150–400)
RBC: 3.68 MIL/uL — ABNORMAL LOW (ref 3.87–5.11)
RDW: 12.8 % (ref 11.5–15.5)
WBC: 9.2 10*3/uL (ref 4.0–10.5)
nRBC: 0 % (ref 0.0–0.2)

## 2018-09-02 MED ORDER — OXYCODONE HCL 5 MG PO TABS: 5.0000 mg | ORAL_TABLET | Freq: Four times a day (QID) | ORAL | 0 refills | Status: DC | PRN

## 2018-09-02 NOTE — Progress Notes (Signed)
[facility]/Trauma Progress Note  1 Day Post-Op   Assessment/Plan Chronic calculous cholecystitis Mild GSP - S/P lap chole, Dr. Dema Severin, 04/12 - pt is okay for discharge from our standpoint, Follow up in [facility]    LOS: 1 day    Subjective: CC: s/p lap chole  Pt is tolerating diet. Having flatus, urinating without issues. No nausea, vomiting, fever or chills overnight. She is ready to go home.   Objective: Vital signs in last 24 hours: Temp:  [97.3 F (36.3 C)-99.1 F (37.3 C)] 98.8 F (37.1 C) (04/13 0445) Pulse Rate:  [66-87] 66 (04/13 0445) Resp:  [13-18] 16 (04/13 0445) BP: (118-157)/(50-83) 128/62 (04/13 0445) SpO2:  [94 %-100 %] 94 % (04/13 0445) Last BM Date: 09/01/18  Intake/Output from previous day: 04/12 0701 - 04/13 0700 In: 2140 [P.O.:240; I.V.:1900] Out: 10 [Blood:10] Intake/Output this shift: No intake/output data recorded.  PE: Gen:  Alert, NAD, pleasant, cooperative Pulm:  Rate and effort normal Abd: Soft, ND, +BS, mild generalized TTP without guarding, no peritonitis, incisions with glue intact appear well without bleeding or drainage  Skin: no rashes noted, warm and dry   Anti-infectives: Anti-infectives (From admission, onward)   Start     Dose/Rate Route Frequency Ordered Stop   09/01/18 1030  ceFAZolin (ANCEF) IVPB 2g/100 mL premix     2 g 200 mL/hr over 30 Minutes Intravenous On call to O.R. 09/01/18 1019 09/01/18 1151   09/01/18 1026  ceFAZolin (ANCEF) 2-4 GM/100ML-% IVPB    Note to Pharmacy:  Merryl Hacker   : cabinet override      09/01/18 1026 09/01/18 1151      Lab Results:  Recent Labs    09/01/18 0310 09/02/18 0438  WBC 7.3 9.2  HGB 12.2 10.9*  HCT 37.8 33.5*  PLT 310 278   BMET Recent Labs    09/01/18 0310 09/02/18 0438  NA 138 137  K 3.9 4.0  CL 105 103  CO2 18* 22  GLUCOSE 59* 102*  BUN 8 13  CREATININE 0.76 0.79  CALCIUM 9.1 9.0   PT/INR Recent Labs    08/31/18 0802  LABPROT 13.2  INR 1.0    CMP     Component Value Date/Time   NA 137 09/02/2018 0438   K 4.0 09/02/2018 0438   CL 103 09/02/2018 0438   CO2 22 09/02/2018 0438   GLUCOSE 102 (H) 09/02/2018 0438   BUN 13 09/02/2018 0438   CREATININE 0.79 09/02/2018 0438   CALCIUM 9.0 09/02/2018 0438   PROT 5.6 (L) 09/02/2018 0438   ALBUMIN 3.2 (L) 09/02/2018 0438   AST 87 (H) 09/02/2018 0438   ALT 109 (H) 09/02/2018 0438   ALKPHOS 106 09/02/2018 0438   BILITOT 0.7 09/02/2018 0438   GFRNONAA >60 09/02/2018 0438   GFRAA >60 09/02/2018 0438   Lipase     Component Value Date/Time   LIPASE 44 09/01/2018 0310    Studies/Results: Mr Abdomen Mrcp Wo Contrast  Result Date: 08/31/2018 CLINICAL DATA:  Abdominal pain.  Suspected atypical pancreatitis. EXAM: MRI ABDOMEN WITHOUT CONTRAST  (INCLUDING MRCP) TECHNIQUE: Multiplanar multisequence MR imaging of the abdomen was performed. Heavily T2-weighted images of the biliary and pancreatic ducts were obtained, and three-dimensional MRCP images were rendered by post processing. COMPARISON:  Ultrasound 08/31/2018.  CT 11/19/2013. FINDINGS: Lower chest:  The visualized lower chest appears unremarkable. Hepatobiliary: The liver is normal in signal without significant steatosis or focal abnormality on noncontrast imaging. As demonstrated on earlier ultrasound, there is  chronic gallbladder wall thickening and cholelithiasis. There was no sonographic Murphy sign on ultrasound. There is no biliary dilatation or evidence of choledocholithiasis. Pancreas: Unremarkable. No pancreatic ductal dilatation or surrounding inflammatory changes. No pancreas divisum. Spleen: Normal in size without focal abnormality. Adrenals/Urinary Tract: Both adrenal glands appear normal. The kidneys and ureters appear normal. No hydronephrosis. Stomach/Bowel: No evidence of bowel wall thickening, distention or surrounding inflammatory change. Vascular/Lymphatic: There are no enlarged abdominal lymph nodes. No significant  vascular findings. Other: No ascites. Musculoskeletal: No acute or significant osseous findings. Mild lumbar spondylosis. IMPRESSION: 1. The pancreas appears normal. 2. Cholelithiasis and chronic gallbladder wall thickening suggesting chronic cholecystitis as correlated with previous ultrasound. No biliary dilatation. Electronically Signed   By: Richardean Sale M.D.   On: 08/31/2018 13:04   Mr 3d Recon At Scanner  Result Date: 08/31/2018 CLINICAL DATA:  Abdominal pain.  Suspected atypical pancreatitis. EXAM: MRI ABDOMEN WITHOUT CONTRAST  (INCLUDING MRCP) TECHNIQUE: Multiplanar multisequence MR imaging of the abdomen was performed. Heavily T2-weighted images of the biliary and pancreatic ducts were obtained, and three-dimensional MRCP images were rendered by post processing. COMPARISON:  Ultrasound 08/31/2018.  CT 11/19/2013. FINDINGS: Lower chest:  The visualized lower chest appears unremarkable. Hepatobiliary: The liver is normal in signal without significant steatosis or focal abnormality on noncontrast imaging. As demonstrated on earlier ultrasound, there is chronic gallbladder wall thickening and cholelithiasis. There was no sonographic Murphy sign on ultrasound. There is no biliary dilatation or evidence of choledocholithiasis. Pancreas: Unremarkable. No pancreatic ductal dilatation or surrounding inflammatory changes. No pancreas divisum. Spleen: Normal in size without focal abnormality. Adrenals/Urinary Tract: Both adrenal glands appear normal. The kidneys and ureters appear normal. No hydronephrosis. Stomach/Bowel: No evidence of bowel wall thickening, distention or surrounding inflammatory change. Vascular/Lymphatic: There are no enlarged abdominal lymph nodes. No significant vascular findings. Other: No ascites. Musculoskeletal: No acute or significant osseous findings. Mild lumbar spondylosis. IMPRESSION: 1. The pancreas appears normal. 2. Cholelithiasis and chronic gallbladder wall thickening  suggesting chronic cholecystitis as correlated with previous ultrasound. No biliary dilatation. Electronically Signed   By: Richardean Sale M.D.   On: 08/31/2018 13:04      Kalman Drape , [facility] 09/02/2018, 8:30 AM  Pager: [phone] Mon-Wed, Friday 7:00am-4:30pm Thurs 7am-11:30am  Consults: [phone]

## 2018-09-02 NOTE — Discharge Instructions (Signed)

## 2018-09-02 NOTE — Discharge Summary (Signed)
Physician Discharge Summary  Crystal Patrick GLO:[id number] DOB: [birth year] DOA: 08/31/2018  PCP: [name], DO  Admit date: 08/31/2018 Discharge date: 09/02/2018  Admitted From: Home Disposition: Home  Recommendations for Outpatient Follow-up:  1. Follow up with PCP in 1-2 weeks 2. Please obtain CMP and CBC at follow-up 3. Please follow up on the following pending results: None  Home Health: None Equipment/Devices: None  Discharge Condition: Stable CODE STATUS: Full code   Hospital Course: 73 y.o.femalewith medical history significant ofcholelithiasis, peptic ulcer disease, GERD, hiatal hernia, prior appendectomy, hypertension, hyperlipidemia, anxiety, depression presenting to the hospital for evaluation of epigastric abdominal pain work-up suggestive of acute on chronic cholecystitis with possible mild gallstone pancreatitis.  RUQ ultrasound showed findings suggestive of chronic cholecystitis.  MRCP showed normal pancreas and confirmed chronic cholecystitis with cholelithiasis.  Patient underwent lap chole on 4/12 with resolution of her pain.  Liver enzyme numbers improved.  Lipase normalized.  On the day of discharge, patient tolerated regular diet without problem and felt ready to go home.  See individual problem list below for more.  Discharge Diagnoses:  Principal Problem:   Gallstone pancreatitis Active Problems:   Essential hypertension   Hypokalemia   Chronic calculus cholecystitis s/p lap cholecystectomy 09/01/2018  Acute on chronic cholecystitis with mild gallstone pancreatitis -Status post lap chole on 4/12 -Lipase normalized and liver enzymes improved -Pain resolved.   -Patient tolerated diet and discharged home to follow-up with PCP.  -Recommend CMP at follow-up to ensure complete resolution of transaminitis.  Other chronic medical conditions are stable.  Discharged on home medications.  Discharge Instructions  Discharge Instructions    Call MD for:   difficulty breathing, headache or visual disturbances   Complete by:  As directed    Call MD for:  persistant dizziness or light-headedness   Complete by:  As directed    Call MD for:  persistant nausea and vomiting   Complete by:  As directed    Call MD for:  redness, tenderness, or signs of infection (pain, swelling, redness, odor or green/yellow discharge around incision site)   Complete by:  As directed    Call MD for:  severe uncontrolled pain   Complete by:  As directed    Call MD for:  temperature >100.4   Complete by:  As directed    Diet - low sodium heart healthy   Complete by:  As directed    Discharge instructions   Complete by:  As directed    It has been a pleasure taking care of you! -Please call and make a follow-up appointment with your primary care doctor in 1 week.  Take care,   Increase activity slowly   Complete by:  As directed      Allergies as of 09/02/2018      Reactions   Other    Hormone replacement therapy-per patient she cannot recall reaction   Statins    Liver failure      Medication List    TAKE these medications   buPROPion 150 MG 24 hr tablet Commonly known as:  WELLBUTRIN XL Take 150 mg by mouth daily.   BUSPAR PO Take 30 mg by mouth 2 (two) times daily.   fenofibrate 160 MG tablet TAKE 1 TABLET DAILY (NEEDS TO SCHEDULE PHYSICAL EXAM) What changed:  See the new instructions.   FLUoxetine 40 MG capsule Commonly known as:  PROZAC Take 40 mg by mouth daily.   magnesium gluconate 500 MG tablet Commonly  known as:  MAGONATE Take 500 mg by mouth daily.   NexIUM 20 MG capsule Generic drug:  esomeprazole Take 40 mg by mouth at bedtime.   ondansetron 4 MG disintegrating tablet Commonly known as:  Zofran ODT Take 1 tablet (4 mg total) by mouth every 8 (eight) hours as needed for nausea or vomiting.   oxyCODONE 5 MG immediate release tablet Commonly known as:  Roxicodone Take 1 tablet (5 mg total) by mouth every 6 (six) hours as  needed.   potassium gluconate 595 (99 K) MG Tabs tablet Take 99 mg by mouth daily.   temazepam 15 MG capsule Commonly known as:  RESTORIL Take 30 mg by mouth at bedtime.   tolterodine 4 MG 24 hr capsule Commonly known as:  Detrol LA TAKE 1 CAPSULE DAILY What changed:    how much to take  how to take this  when to take this  additional instructions   valsartan-hydrochlorothiazide 80-12.5 MG tablet Commonly known as:  DIOVAN-HCT TAKE 1 TABLET DAILY   Vitamin D3 125 MCG (5000 UT) Tabs Take 5,000 Units by mouth daily.   vitamin E 400 UNIT capsule Take 400 Units by mouth daily.      Follow-up [facility] Follow up on 09/17/2018.   Specialty:  General Surgery Why:  a provider will call you at 9:15am for your televisit. Please take a photo of your incisions and send them to [email or web address]. please place your name and date of birth in the subject line.  Contact information: [address] [place], [place], [place]. Schedule an appointment as soon as possible for a visit in 1 week(s).   Specialty:  Family Medicine Contact information: [place] [address] [phone]           Consultations:  General surgery.  Procedures/Studies:  2D Echo: None obtained this admission.  Mr Abdomen Mrcp Wo Contrast  Result Date: 08/31/2018 CLINICAL DATA:  Abdominal pain.  Suspected atypical pancreatitis. EXAM: MRI ABDOMEN WITHOUT CONTRAST  (INCLUDING MRCP) TECHNIQUE: Multiplanar multisequence MR imaging of the abdomen was performed. Heavily T2-weighted images of the biliary and pancreatic ducts were obtained, and three-dimensional MRCP images were rendered by post processing. COMPARISON:  Ultrasound 08/31/2018.  CT 11/19/2013. FINDINGS: Lower chest:  The visualized lower chest appears unremarkable. Hepatobiliary: The liver is normal in  signal without significant steatosis or focal abnormality on noncontrast imaging. As demonstrated on earlier ultrasound, there is chronic gallbladder wall thickening and cholelithiasis. There was no sonographic Murphy sign on ultrasound. There is no biliary dilatation or evidence of choledocholithiasis. Pancreas: Unremarkable. No pancreatic ductal dilatation or surrounding inflammatory changes. No pancreas divisum. Spleen: Normal in size without focal abnormality. Adrenals/Urinary Tract: Both adrenal glands appear normal. The kidneys and ureters appear normal. No hydronephrosis. Stomach/Bowel: No evidence of bowel wall thickening, distention or surrounding inflammatory change. Vascular/Lymphatic: There are no enlarged abdominal lymph nodes. No significant vascular findings. Other: No ascites. Musculoskeletal: No acute or significant osseous findings. Mild lumbar spondylosis. IMPRESSION: 1. The pancreas appears normal. 2. Cholelithiasis and chronic gallbladder wall thickening suggesting chronic cholecystitis as correlated with previous ultrasound. No biliary dilatation. Electronically Signed   By: Richardean Sale M.D.   On: 08/31/2018 13:04   Mr 3d Recon At Scanner  Result Date: 08/31/2018 CLINICAL DATA:  Abdominal pain.  Suspected atypical pancreatitis. EXAM: MRI ABDOMEN WITHOUT CONTRAST  (INCLUDING MRCP) TECHNIQUE: Multiplanar  multisequence MR imaging of the abdomen was performed. Heavily T2-weighted images of the biliary and pancreatic ducts were obtained, and three-dimensional MRCP images were rendered by post processing. COMPARISON:  Ultrasound 08/31/2018.  CT 11/19/2013. FINDINGS: Lower chest:  The visualized lower chest appears unremarkable. Hepatobiliary: The liver is normal in signal without significant steatosis or focal abnormality on noncontrast imaging. As demonstrated on earlier ultrasound, there is chronic gallbladder wall thickening and cholelithiasis. There was no sonographic Murphy sign on  ultrasound. There is no biliary dilatation or evidence of choledocholithiasis. Pancreas: Unremarkable. No pancreatic ductal dilatation or surrounding inflammatory changes. No pancreas divisum. Spleen: Normal in size without focal abnormality. Adrenals/Urinary Tract: Both adrenal glands appear normal. The kidneys and ureters appear normal. No hydronephrosis. Stomach/Bowel: No evidence of bowel wall thickening, distention or surrounding inflammatory change. Vascular/Lymphatic: There are no enlarged abdominal lymph nodes. No significant vascular findings. Other: No ascites. Musculoskeletal: No acute or significant osseous findings. Mild lumbar spondylosis. IMPRESSION: 1. The pancreas appears normal. 2. Cholelithiasis and chronic gallbladder wall thickening suggesting chronic cholecystitis as correlated with previous ultrasound. No biliary dilatation. Electronically Signed   By: Richardean Sale M.D.   On: 08/31/2018 13:04   US Abdomen Limited  Result Date: 08/31/2018 CLINICAL DATA:  Right upper quadrant epigastric pain tonight. Known gallstones. Elevated lipase. EXAM: ULTRASOUND ABDOMEN LIMITED RIGHT UPPER QUADRANT COMPARISON:  Ultrasound 07/30/2018 FINDINGS: Gallbladder: Contracted with gallstones and gallbladder wall thickening, wall thicknesses of 6-74mm. No pericholecystic fluid. No sonographic Murphy sign noted by sonographer. Common bile duct: Diameter: 4-6 mm, normal. Liver: No focal lesion identified. Heterogeneous and slightly increased parenchymal echogenicity. Portal vein is patent on color Doppler imaging with normal direction of blood flow towards the liver. IMPRESSION: 1. Contracted gallbladder with gallstones and wall thickening, similar in appearance to prior exam. Findings suggest chronic cholecystitis. Negative sonographic Murphy sign biliary dilatation. 2. Mild hepatic steatosis. Electronically Signed   By: Keith Rake M.D.   On: 08/31/2018 03:14      Subjective: Had lap chole yesterday.   No major events overnight.  Tolerated her breakfast morning.  Feels ready to go home.  Discharge Exam: Vitals:   09/01/18 2145 09/02/18 0445  BP: (!) 118/50 128/62  Pulse: 72 66  Resp: 18 16  Temp: 99.1 F (37.3 C) 98.8 F (37.1 C)  SpO2: 95% 94%    GENERAL: Appears well. No acute distress.  HEENT: MMM.  Vision and Hearing grossly intact.  NECK: Supple.  No JVD.  LUNGS:  No IWOB. Good air movement. CTAB.  HEART:  RRR. Heart sounds normal. ABD: Bowel sounds present. Soft.  Mild diffuse tenderness.  Lap chole sites appear clean and dry. EXT:   no edema bilaterally.  SKIN: no apparent skin lesion.  NEURO: Awake, alert and oriented appropriately.  No gross deficit.  PSYCH: Calm. Normal affect.   The results of significant diagnostics from this hospitalization (including imaging, microbiology, ancillary and laboratory) are listed below for reference.     Microbiology: Recent Results (from the past 240 hour(s))  Surgical pcr screen     Status: None   Collection Time: 09/01/18  9:58 AM  Result Value Ref Range Status   MRSA, PCR NEGATIVE NEGATIVE Final   Staphylococcus aureus NEGATIVE NEGATIVE Final    Comment: (NOTE) The Xpert SA Assay (FDA approved for NASAL specimens in patients 4 years of age and older), is one component of a comprehensive surveillance program. It is not intended to diagnose infection nor to guide or monitor treatment. Performed  at [facility], [place] [address], [place], [place] [address]      Labs: BNP (last 3 results) No results for input(s): BNP in the last 8760 hours. Basic Metabolic Panel: Recent Labs  Lab 08/31/18 0051 08/31/18 0802 09/01/18 0310 09/02/18 0438  NA 138 140 138 137  K 3.4* 3.6 3.9 4.0  CL 101 106 105 103  CO2 24 24 18* 22  GLUCOSE 112* 95 59* 102*  BUN 15 12 8 13   CREATININE 0.84 0.78 0.76 0.79  CALCIUM 9.3 8.5* 9.1 9.0  MG 2.1  --   --   --    Liver Function Tests: Recent Labs  Lab 08/31/18 0051 08/31/18  0802 09/01/18 0310 09/02/18 0438  AST 105* 192* 69* 87*  ALT 93* 144* 112* 109*  ALKPHOS 86 81 70 106  BILITOT 1.0 1.1 1.1 0.7  PROT 6.5 5.5* 5.9* 5.6*  ALBUMIN 4.1 3.3* 3.6 3.2*   Recent Labs  Lab 08/31/18 0051 09/01/18 0310  LIPASE 570* 44   No results for input(s): AMMONIA in the last 168 hours. CBC: Recent Labs  Lab 08/31/18 0051 08/31/18 0802 09/01/18 0310 09/02/18 0438  WBC 7.0 5.0 7.3 9.2  HGB 13.1 11.8* 12.2 10.9*  HCT 40.7 37.1 37.8 33.5*  MCV 91.9 91.8 92.4 91.0  PLT 350 299 310 278   Cardiac Enzymes: No results for input(s): CKTOTAL, CKMB, CKMBINDEX, TROPONINI in the last 168 hours. BNP: Invalid input(s): POCBNP CBG: No results for input(s): GLUCAP in the last 168 hours. D-Dimer No results for input(s): DDIMER in the last 72 hours. Hgb A1c No results for input(s): HGBA1C in the last 72 hours. Lipid Profile Recent Labs    08/31/18 0802  CHOL 153  HDL 54  LDLCALC 91  TRIG 42  CHOLHDL 2.8   Thyroid function studies No results for input(s): TSH, T4TOTAL, T3FREE, THYROIDAB in the last 72 hours.  Invalid input(s): FREET3 Anemia work up No results for input(s): VITAMINB12, FOLATE, FERRITIN, TIBC, IRON, RETICCTPCT in the last 72 hours. Urinalysis    Component Value Date/Time   COLORURINE YELLOW 08/31/2018 0049   APPEARANCEUR HAZY (A) 08/31/2018 0049   LABSPEC 1.015 08/31/2018 0049   PHURINE 7.0 08/31/2018 0049   GLUCOSEU NEGATIVE 08/31/2018 0049   GLUCOSEU NEGATIVE 09/22/2013 1451   HGBUR SMALL (A) 08/31/2018 0049   BILIRUBINUR NEGATIVE 08/31/2018 0049   BILIRUBINUR negative 09/17/2014 0820   KETONESUR NEGATIVE 08/31/2018 0049   PROTEINUR NEGATIVE 08/31/2018 0049   UROBILINOGEN 0.2 09/17/2014 0820   UROBILINOGEN 0.2 09/22/2013 1451   NITRITE NEGATIVE 08/31/2018 0049   LEUKOCYTESUR SMALL (A) 08/31/2018 0049   Sepsis Labs Invalid input(s): PROCALCITONIN,  WBC,  LACTICIDVEN   Time coordinating discharge: 25 minutes  SIGNED:  Mercy Riding, MD  [facility] 09/02/2018, 10:36 AM Pager [phone]  If 7PM-7AM, please contact night-coverage [email or web address] Password [email or web address]

## 2018-09-03 ENCOUNTER — Telehealth: Payer: Self-pay | Admitting: *Deleted

## 2018-09-03 NOTE — Telephone Encounter (Signed)
Copied from [place] [id number]. Topic: General - Other >> Sep 03, 2018  3:19 PM Leward Quan A wrote: Reason for CRM: Patient [facility] that she needed to be scheduled for an appointment on Friday 09/06/2018 asking for a call back please. Ph# [phone]

## 2018-09-03 NOTE — Telephone Encounter (Signed)
I called the pt for more information regarding a visit.  Patient stated she has sent a call to Dr Juleen China for an appt as she was recently discharged from the hospital and this is her current PCP.

## 2018-09-06 ENCOUNTER — Other Ambulatory Visit: Payer: Medicare Other

## 2018-09-06 ENCOUNTER — Ambulatory Visit (INDEPENDENT_AMBULATORY_CARE_PROVIDER_SITE_OTHER): Payer: Medicare Other | Admitting: Family Medicine

## 2018-09-06 ENCOUNTER — Encounter: Payer: Self-pay | Admitting: Family Medicine

## 2018-09-06 ENCOUNTER — Other Ambulatory Visit: Payer: Self-pay

## 2018-09-06 VITALS — Ht 62.0 in | Wt 144.4 lb

## 2018-09-06 DIAGNOSIS — E538 Deficiency of other specified B group vitamins: Secondary | ICD-10-CM

## 2018-09-06 DIAGNOSIS — K8010 Calculus of gallbladder with chronic cholecystitis without obstruction: Secondary | ICD-10-CM | POA: Diagnosis not present

## 2018-09-06 DIAGNOSIS — R5383 Other fatigue: Secondary | ICD-10-CM

## 2018-09-06 DIAGNOSIS — R739 Hyperglycemia, unspecified: Secondary | ICD-10-CM | POA: Diagnosis not present

## 2018-09-06 DIAGNOSIS — D649 Anemia, unspecified: Secondary | ICD-10-CM | POA: Diagnosis not present

## 2018-09-06 LAB — LIPASE: Lipase: 22 U/L (ref 11.0–59.0)

## 2018-09-06 LAB — COMPREHENSIVE METABOLIC PANEL
ALT: 125 U/L — ABNORMAL HIGH (ref 0–35)
AST: 54 U/L — ABNORMAL HIGH (ref 0–37)
Albumin: 4.6 g/dL (ref 3.5–5.2)
Alkaline Phosphatase: 124 U/L — ABNORMAL HIGH (ref 39–117)
BUN: 21 mg/dL (ref 6–23)
CO2: 25 mEq/L (ref 19–32)
Calcium: 9.7 mg/dL (ref 8.4–10.5)
Chloride: 101 mEq/L (ref 96–112)
Creatinine, Ser: 1.07 mg/dL (ref 0.40–1.20)
GFR: 50.53 mL/min — ABNORMAL LOW (ref >60.00)
Glucose, Bld: 137 mg/dL — ABNORMAL HIGH (ref 70–99)
Potassium: 4.1 mEq/L (ref 3.5–5.1)
Sodium: 139 mEq/L (ref 135–145)
Total Bilirubin: 0.4 mg/dL (ref 0.2–1.2)
Total Protein: 7.1 g/dL (ref 6.0–8.3)

## 2018-09-06 LAB — CBC WITH DIFFERENTIAL/PLATELET
Basophils Absolute: 0.1 10*3/uL (ref 0.0–0.1)
Basophils Relative: 1.2 % (ref 0.0–3.0)
Eosinophils Absolute: 0.2 10*3/uL (ref 0.0–0.7)
Eosinophils Relative: 3.7 % (ref 0.0–5.0)
HCT: 42.9 % (ref 36.0–46.0)
Hemoglobin: 14.6 g/dL (ref 12.0–15.0)
Lymphocytes Relative: 28.4 % (ref 12.0–46.0)
Lymphs Abs: 1.6 10*3/uL (ref 0.7–4.0)
MCHC: 34 g/dL (ref 30.0–36.0)
MCV: 91.7 fl (ref 78.0–100.0)
Monocytes Absolute: 0.4 10*3/uL (ref 0.1–1.0)
Monocytes Relative: 7.6 % (ref 3.0–12.0)
Neutro Abs: 3.4 10*3/uL (ref 1.4–7.7)
Neutrophils Relative %: 59.1 % (ref 43.0–77.0)
Platelets: 475 10*3/uL — ABNORMAL HIGH (ref 150.0–400.0)
RBC: 4.68 Mil/uL (ref 3.87–5.11)
RDW: 13.3 % (ref 11.5–15.5)
WBC: 5.7 10*3/uL (ref 4.0–10.5)

## 2018-09-06 LAB — VITAMIN B12: Vitamin B-12: 426 pg/mL (ref 211–911)

## 2018-09-06 LAB — HEMOGLOBIN A1C: Hgb A1c MFr Bld: 5.9 % (ref 4.6–6.5)

## 2018-09-06 NOTE — Progress Notes (Addendum)
Virtual Visit via Video   I connected with Crystal Patrick by a video enabled telemedicine application and verified that I am speaking with the correct person using two identifiers. Location patient: Home Location provider: [facility], Office Persons participating in the virtual visit: Crystal Patrick, Crystal Deutscher, DO Crystal Patrick, CMA acting as scribe for Dr. Briscoe Patrick.   I discussed the limitations of evaluation and management by telemedicine and the availability of in person appointments. The patient expressed understanding and agreed to proceed.  Subjective:   HPI: This patient is a very pleasant 73 year old woman with a past medical history significant for cholelithiasis, peptic ulcer disease, GERD, hiatal hernia, along with other medical conditions of hypertension, hyperlipidemia that was diagnosed with cholelithiasis and chronic cholecystitis a month ago.  Have referred her to general surgery for intervention and that has been scheduled.  Unfortunately the COVID pandemic that surgery out.  She did end up having on chronic cholecystitis with gallstone pancreatitis.  She was hospitalized and underwent a cholecystectomy.  Per hospital needs to have CBC and CMP by our office. She is tying to increase water intake. She has not had much of appetite. She is working on getting her energy level back up to normal. She has had some dizziness when she stands and slight headache that only last few seconds. She has not been checking her blood pressures at home.  Patient in instructed  to make sure that she is eating lots of protein and lots of water.    Reviewed all precautions and expectations with prevention of Covid-19. She was in hospital so she has had exposure to Covid-19. She is not having any symptoms. She will call us if she has any questions.   ROS: See pertinent positives and negatives per HPI.  Patient Active Problem List   Diagnosis Date Noted  . Chronic calculus  cholecystitis s/p lap cholecystectomy 09/01/2018 09/01/2018  . MDD (major depressive disorder) 03/17/2016  . BMI 29.0-29.9,adult 03/17/2016  . Hx of osteoporosis 03/17/2016  . Essential hypertension 10/30/2014  . Seasonal allergies 10/30/2014  . GERD (gastroesophageal reflux disease) 06/02/2013  . Hyperlipidemia 06/02/2013  . Overactive bladder 06/02/2013    Social History   Tobacco Use  . Smoking status: Never Smoker  . Smokeless tobacco: Never Used  Substance Use Topics  . Alcohol use: Yes    Alcohol/week: 0.0 standard drinks    Comment: one drink a day   Current Outpatient Medications:  .  buPROPion (WELLBUTRIN XL) 150 MG 24 hr tablet, Take 150 mg by mouth daily. , Disp: , Rfl:  .  BusPIRone HCl (BUSPAR PO), Take 30 mg by mouth 2 (two) times daily. , Disp: , Rfl:  .  Cholecalciferol (VITAMIN D3) 125 MCG (5000 UT) TABS, Take 5,000 Units by mouth daily. , Disp: , Rfl:  .  esomeprazole (NEXIUM) 20 MG capsule, Take 40 mg by mouth at bedtime. , Disp: , Rfl:  .  fenofibrate 160 MG tablet, TAKE 1 TABLET DAILY (NEEDS TO SCHEDULE PHYSICAL EXAM) (Patient taking differently: Take 160 mg by mouth daily. ), Disp: 90 tablet, Rfl: 0 .  FLUoxetine (PROZAC) 40 MG capsule, Take 40 mg by mouth daily. , Disp: , Rfl:  .  magnesium gluconate (MAGONATE) 500 MG tablet, Take 500 mg by mouth daily., Disp: , Rfl:  .  ondansetron (ZOFRAN ODT) 4 MG disintegrating tablet, Take 1 tablet (4 mg total) by mouth every 8 (eight) hours as needed for nausea or vomiting., Disp: 20  tablet, Rfl: 0 .  oxyCODONE (ROXICODONE) 5 MG immediate release tablet, Take 1 tablet (5 mg total) by mouth every 6 (six) hours as needed., Disp: 20 tablet, Rfl: 0 .  potassium gluconate 595 (99 K) MG TABS tablet, Take 99 mg by mouth daily. , Disp: , Rfl:  .  temazepam (RESTORIL) 15 MG capsule, Take 30 mg by mouth at bedtime. , Disp: , Rfl:  .  tolterodine (DETROL LA) 4 MG 24 hr capsule, TAKE 1 CAPSULE DAILY (Patient taking differently: Take 4  mg by mouth daily. ), Disp: 90 capsule, Rfl: 1 .  valsartan-hydrochlorothiazide (DIOVAN-HCT) 80-12.5 MG tablet, TAKE 1 TABLET DAILY (Patient taking differently: Take 1 tablet by mouth daily. ), Disp: 90 tablet, Rfl: 0 .  vitamin E 400 UNIT capsule, Take 400 Units by mouth daily. , Disp: , Rfl:   Allergies  Allergen Reactions  . Other     Hormone replacement therapy-per patient she cannot recall reaction  . Statins     Liver failure    Objective:   VITALS: Per patient if applicable, see vitals. GENERAL: Alert, appears well and in no acute distress. HEENT: Atraumatic, conjunctiva clear, no obvious abnormalities on inspection of external nose and ears. NECK: Normal movements of the head and neck. CARDIOPULMONARY: No increased WOB. Speaking in clear sentences. I:E ratio WNL.  MS: Moves all visible extremities without noticeable abnormality. PSYCH: Pleasant and cooperative, well-groomed. Speech normal rate and rhythm. Affect is appropriate. Insight and judgement are appropriate. Attention is focused, linear, and appropriate.  NEURO: CN grossly intact. Oriented as arrived to appointment on time with no prompting. Moves both UE equally.  SKIN: No obvious lesions, wounds, erythema, or cyanosis noted on face or hands.  Assessment and Plan:   Miryah was seen today for hospitalization follow-up.  Medication reconciliation:  [x]   Medication list updated [x]   New medication list given to patient/family/caregiver  Referrals: [x]   None needed []   Referrals made to:   Community resources identified for patient/family:  [x]   None needed  []   Home health agency []   Assisted living  []   Hospice  []   Support group  []   Education program  Durable medical equipment ordered:  [x]   None needed  []   DME ordered:   Additional communication delivered or planned:  [x]   Family/Caregiver:  []   Specialists:  []   Other:  Patient education: Topics discussed: AS ABOVE Handouts given: SEE  AVS  Diagnoses and all orders for this visit:  Chronic calculus cholecystitis s/p lap cholecystectomy 09/01/2018 -     CBC with Differential/Platelet -     Comprehensive metabolic panel -     Lipase  Hyperglycemia -     Hemoglobin A1c  Vitamin B 12 deficiency -     Vitamin B12  Fatigue, unspecified type  Anemia, unspecified type -     CBC with Differential/Platelet -     Iron and TIBC -     Vitamin B12 -     Iron,Total/Total Iron Binding Cap  . Reviewed expectations re: course of current medical issues. . Discussed self-management of symptoms. . Outlined signs and symptoms indicating need for more acute intervention. . Patient verbalized understanding and all questions were answered. Marland Kitchen Health Maintenance issues including appropriate healthy diet, exercise, and smoking avoidance were discussed with patient. . See orders for this visit as documented in the electronic medical record.  Crystal Deutscher, DO  Records requested if needed. Time spent: 45 minutes, of which >50% was spent in  obtaining information about her symptoms, reviewing her previous labs, evaluations, and treatments, counseling her about her condition (please see the discussed topics above), and developing a plan to further investigate it; she had a number of questions which I addressed.

## 2018-09-07 LAB — IRON, TOTAL/TOTAL IRON BINDING CAP
%SAT: 19 % (calc) (ref 16–45)
Iron: 85 ug/dL (ref 45–160)
TIBC: 439 mcg/dL (calc) (ref 250–450)

## 2018-09-08 ENCOUNTER — Encounter: Payer: Self-pay | Admitting: Family Medicine

## 2018-09-28 ENCOUNTER — Other Ambulatory Visit: Payer: Self-pay | Admitting: Family Medicine

## 2018-10-08 ENCOUNTER — Encounter: Payer: Self-pay | Admitting: Physician Assistant

## 2018-10-08 ENCOUNTER — Ambulatory Visit (INDEPENDENT_AMBULATORY_CARE_PROVIDER_SITE_OTHER): Payer: Medicare Other | Admitting: Physician Assistant

## 2018-10-08 VITALS — BP 157/90

## 2018-10-08 DIAGNOSIS — I10 Essential (primary) hypertension: Secondary | ICD-10-CM

## 2018-10-08 MED ORDER — VALSARTAN-HYDROCHLOROTHIAZIDE 160-12.5 MG PO TABS: 1.0000 | ORAL_TABLET | Freq: Every day | ORAL | 0 refills | Status: DC

## 2018-10-08 NOTE — Progress Notes (Signed)
TELEPHONE ENCOUNTER   Patient verbally agreed to telephone visit and is aware that copayment and coinsurance may apply. Patient was treated using telemedicine according to accepted telemedicine protocols.  Location of the patient: Home Location of provider: [place] of all persons participating in the telemedicine service and role in the encounter: Inda Coke, [place], Charma Igo, Anselmo Pickler, LPN   I acted as a Education administrator for [employer], PA-C Anselmo Pickler, LPN  Subjective:   Chief Complaint  Patient presents with  . Hypertension     HPI   HTN Currently taking Valsartan-HCTZ 80-12.5 mg. At home blood pressure readings are: 167/95. She states that she has noticed that since her gallbladder surgery she has had increase in her blood pressures. Patient denies chest pain, SOB, blurred vision, dizziness, unusual headaches, lower leg swelling. Patient is compliant with medication. Denies excessive caffeine intake, stimulant usage, excessive alcohol intake, or increase in salt consumption.  She also endorses significant stress right now with the pandemic.  Patient Active Problem List   Diagnosis Date Noted  . Chronic calculus cholecystitis s/p lap cholecystectomy 09/01/2018 09/01/2018  . MDD (major depressive disorder) 03/17/2016  . BMI 29.0-29.9,adult 03/17/2016  . Hx of osteoporosis 03/17/2016  . Essential hypertension 10/30/2014  . Seasonal allergies 10/30/2014  . GERD (gastroesophageal reflux disease) 06/02/2013  . Hyperlipidemia 06/02/2013  . Overactive bladder 06/02/2013   Social History   Tobacco Use  . Smoking status: Never Smoker  . Smokeless tobacco: Never Used  Substance Use Topics  . Alcohol use: Yes    Alcohol/week: 0.0 standard drinks    Comment: one drink a day    Current Outpatient Medications:  .  buPROPion (WELLBUTRIN XL) 150 MG 24 hr tablet, Take 150 mg by mouth daily. , Disp: , Rfl:  .  BusPIRone HCl (BUSPAR PO), Take 45  mg by mouth 2 (two) times daily. , Disp: , Rfl:  .  Cholecalciferol (VITAMIN D3) 125 MCG (5000 UT) TABS, Take 5,000 Units by mouth daily. , Disp: , Rfl:  .  esomeprazole (NEXIUM) 20 MG capsule, Take 40 mg by mouth at bedtime. , Disp: , Rfl:  .  fenofibrate 160 MG tablet, TAKE 1 TABLET DAILY (NEEDS TO SCHEDULE PHYSICAL EXAM) (Patient taking differently: Take 160 mg by mouth daily. ), Disp: 90 tablet, Rfl: 0 .  FLUoxetine (PROZAC) 40 MG capsule, Take 40 mg by mouth daily. , Disp: , Rfl:  .  magnesium gluconate (MAGONATE) 500 MG tablet, Take 500 mg by mouth daily., Disp: , Rfl:  .  potassium gluconate 595 (99 K) MG TABS tablet, Take 99 mg by mouth daily. , Disp: , Rfl:  .  temazepam (RESTORIL) 15 MG capsule, Take 30 mg by mouth at bedtime. , Disp: , Rfl:  .  tolterodine (DETROL LA) 4 MG 24 hr capsule, Take 1 capsule (4 mg total) by mouth daily., Disp: 90 capsule, Rfl: 2 .  vitamin E 400 UNIT capsule, Take 400 Units by mouth daily. , Disp: , Rfl:  .  valsartan-hydrochlorothiazide (DIOVAN-HCT) 160-12.5 MG tablet, Take 1 tablet by mouth daily for 30 days., Disp: 30 tablet, Rfl: 0 Allergies  Allergen Reactions  . Other     Hormone replacement therapy-per patient she cannot recall reaction  . Statins     Liver failure    Assessment & Plan:   Essential hypertension Uncontrolled. Will increase medication to Diovan 160 mg - HCTZ 12.5 mg. She has scheduled follow-up with PCP in two weeks. Recommended  that she reach out to [place] sooner if any concerns in the meantime.  No orders of the defined types were placed in this encounter.  Meds ordered this encounter  Medications  . valsartan-hydrochlorothiazide (DIOVAN-HCT) 160-12.5 MG tablet    Sig: Take 1 tablet by mouth daily for 30 days.    Dispense:  30 tablet    Refill:  0    Order Specific Question:   Supervising Provider    Answer:   Juleen China, ERICA [place], [place] 10/08/2018  Time spent with the patient: 8 minutes, spent in  obtaining information about her symptoms, reviewing her previous labs, evaluations, and treatments, counseling her about her condition (please see the discussed topics above), and developing a plan to further investigate it; she had a number of questions which I addressed.

## 2018-10-08 NOTE — Assessment & Plan Note (Addendum)
Uncontrolled. Will increase medication to Diovan 160 mg - HCTZ 12.5 mg. She has scheduled follow-up with PCP in two weeks. Recommended that she reach out to [place] sooner if any concerns in the meantime.

## 2018-10-21 DIAGNOSIS — F331 Major depressive disorder, recurrent, moderate: Secondary | ICD-10-CM | POA: Diagnosis not present

## 2018-10-24 DIAGNOSIS — L720 Epidermal cyst: Secondary | ICD-10-CM | POA: Diagnosis not present

## 2018-10-24 DIAGNOSIS — L821 Other seborrheic keratosis: Secondary | ICD-10-CM | POA: Diagnosis not present

## 2018-10-31 DIAGNOSIS — L723 Sebaceous cyst: Secondary | ICD-10-CM | POA: Diagnosis not present

## 2018-11-04 NOTE — Progress Notes (Deleted)
Crystal Patrick is a 73 y.o. female is here for follow up.  History of Present Illness:   (SCRIBE ATTESTATION)  HPI:   Health Maintenance Due  Topic Date Due  . MAMMOGRAM  10/21/2015  . TETANUS/TDAP  05/22/2018   Depression screen PHQ 2/9 02/05/2018 10/08/2017 07/10/2017  Decreased Interest 3 1 1   Down, Depressed, Hopeless 3 1 1   PHQ - 2 Score 6 2 2   Altered sleeping 3 1 3   Tired, decreased energy 3 1 3   Change in appetite 2 1 3   Feeling bad or failure about yourself  0 0 0  Trouble concentrating 3 1 3   Moving slowly or fidgety/restless 0 0 0  Suicidal thoughts 0 0 0  PHQ-9 Score 17 6 14    PMHx, SurgHx, SocialHx, FamHx, Medications, and Allergies were reviewed in the Visit Navigator and updated as appropriate.   Patient Active Problem List   Diagnosis Date Noted  . Chronic calculus cholecystitis s/p lap cholecystectomy 09/01/2018 09/01/2018  . MDD (major depressive disorder) 03/17/2016  . BMI 29.0-29.9,adult 03/17/2016  . Hx of osteoporosis 03/17/2016  . Essential hypertension 10/30/2014  . Seasonal allergies 10/30/2014  . GERD (gastroesophageal reflux disease) 06/02/2013  . Hyperlipidemia 06/02/2013  . Overactive bladder 06/02/2013   Social History   Tobacco Use  . Smoking status: Never Smoker  . Smokeless tobacco: Never Used  Substance Use Topics  . Alcohol use: Yes    Alcohol/week: 0.0 standard drinks    Comment: one drink a day  . Drug use: No   Current Medications and Allergies   Current Outpatient Medications:  .  buPROPion (WELLBUTRIN XL) 150 MG 24 hr tablet, Take 150 mg by mouth daily. , Disp: , Rfl:  .  BusPIRone HCl (BUSPAR PO), Take 45 mg by mouth 2 (two) times daily. , Disp: , Rfl:  .  Cholecalciferol (VITAMIN D3) 125 MCG (5000 UT) TABS, Take 5,000 Units by mouth daily. , Disp: , Rfl:  .  esomeprazole (NEXIUM) 20 MG capsule, Take 40 mg by mouth at bedtime. , Disp: , Rfl:  .  fenofibrate 160 MG tablet, TAKE 1 TABLET DAILY (NEEDS TO SCHEDULE PHYSICAL  EXAM) (Patient taking differently: Take 160 mg by mouth daily. ), Disp: 90 tablet, Rfl: 0 .  FLUoxetine (PROZAC) 40 MG capsule, Take 40 mg by mouth daily. , Disp: , Rfl:  .  magnesium gluconate (MAGONATE) 500 MG tablet, Take 500 mg by mouth daily., Disp: , Rfl:  .  potassium gluconate 595 (99 K) MG TABS tablet, Take 99 mg by mouth daily. , Disp: , Rfl:  .  temazepam (RESTORIL) 15 MG capsule, Take 30 mg by mouth at bedtime. , Disp: , Rfl:  .  tolterodine (DETROL LA) 4 MG 24 hr capsule, Take 1 capsule (4 mg total) by mouth daily., Disp: 90 capsule, Rfl: 2 .  valsartan-hydrochlorothiazide (DIOVAN-HCT) 160-12.5 MG tablet, Take 1 tablet by mouth daily for 30 days., Disp: 30 tablet, Rfl: 0 .  vitamin E 400 UNIT capsule, Take 400 Units by mouth daily. , Disp: , Rfl:   Allergies  Allergen Reactions  . Other     Hormone replacement therapy-per patient she cannot recall reaction  . Statins     Liver failure   Review of Systems   Pertinent items are noted in the HPI. Otherwise, a complete ROS is negative.  Vitals  There were no vitals filed for this visit.   There is no height or weight on file to calculate BMI.  Physical Exam   Physical Exam  Results for orders placed or performed in visit on 09/06/18  CBC with Differential/Platelet  Result Value Ref Range   WBC 5.7 4.0 - 10.5 K/uL   RBC 4.68 3.87 - 5.11 Mil/uL   Hemoglobin 14.6 12.0 - 15.0 g/dL   HCT 42.9 36.0 - 46.0 %   MCV 91.7 78.0 - 100.0 fl   MCHC 34.0 30.0 - 36.0 g/dL   RDW 13.3 11.5 - 15.5 %   Platelets 475.0 (H) 150.0 - 400.0 K/uL   Neutrophils Relative % 59.1 43.0 - 77.0 %   Lymphocytes Relative 28.4 12.0 - 46.0 %   Monocytes Relative 7.6 3.0 - 12.0 %   Eosinophils Relative 3.7 0.0 - 5.0 %   Basophils Relative 1.2 0.0 - 3.0 %   Neutro Abs 3.4 1.4 - 7.7 K/uL   Lymphs Abs 1.6 0.7 - 4.0 K/uL   Monocytes Absolute 0.4 0.1 - 1.0 K/uL   Eosinophils Absolute 0.2 0.0 - 0.7 K/uL   Basophils Absolute 0.1 0.0 - 0.1 K/uL    Comprehensive metabolic panel  Result Value Ref Range   Sodium 139 135 - 145 mEq/L   Potassium 4.1 3.5 - 5.1 mEq/L   Chloride 101 96 - 112 mEq/L   CO2 25 19 - 32 mEq/L   Glucose, Bld 137 (H) 70 - 99 mg/dL   BUN 21 6 - 23 mg/dL   Creatinine, Ser 1.07 0.40 - 1.20 mg/dL   Total Bilirubin 0.4 0.2 - 1.2 mg/dL   Alkaline Phosphatase 124 (H) 39 - 117 U/L   AST 54 (H) 0 - 37 U/L   ALT 125 (H) 0 - 35 U/L   Total Protein 7.1 6.0 - 8.3 g/dL   Albumin 4.6 3.5 - 5.2 g/dL   Calcium 9.7 8.4 - 10.5 mg/dL   GFR 50.53 (L) >60.00 mL/min  Hemoglobin A1c  Result Value Ref Range   Hgb A1c MFr Bld 5.9 4.6 - 6.5 %  Lipase  Result Value Ref Range   Lipase 22.0 11.0 - 59.0 U/L  Vitamin B12  Result Value Ref Range   Vitamin B-12 426 211 - 911 pg/mL  Iron,Total/Total Iron Binding Cap  Result Value Ref Range   Iron 85 45 - 160 mcg/dL   TIBC 439 250 - 450 mcg/dL (calc)   %SAT 19 16 - 45 % (calc)    Assessment and Plan   There are no diagnoses linked to this encounter.  . Orders and follow up as documented in [place], reviewed diet, exercise and weight control, cardiovascular risk and specific lipid/LDL goals reviewed, reviewed medications and side effects in detail.  . Reviewed expectations re: course of current medical issues. . Outlined signs and symptoms indicating need for more acute intervention. . Patient verbalized understanding and all questions were answered. . Patient received an After Visit Summary.  *** CMA served as Education administrator during this visit. History, Physical, and Plan performed by medical provider. The above documentation has been reviewed and is accurate and complete. Briscoe Deutscher, D.O.  Briscoe Deutscher, DO [employer], [facility] 11/04/2018

## 2018-11-05 ENCOUNTER — Encounter: Payer: Medicare Other | Admitting: Family Medicine

## 2018-11-06 DIAGNOSIS — Z0189 Encounter for other specified special examinations: Secondary | ICD-10-CM | POA: Diagnosis not present

## 2018-11-07 ENCOUNTER — Other Ambulatory Visit: Payer: Self-pay

## 2018-11-07 ENCOUNTER — Ambulatory Visit (INDEPENDENT_AMBULATORY_CARE_PROVIDER_SITE_OTHER): Payer: Medicare Other | Admitting: Family Medicine

## 2018-11-07 ENCOUNTER — Encounter: Payer: Self-pay | Admitting: Family Medicine

## 2018-11-07 VITALS — BP 148/96 | HR 94 | Temp 97.7°F | Ht 63.0 in | Wt 150.6 lb

## 2018-11-07 DIAGNOSIS — I10 Essential (primary) hypertension: Secondary | ICD-10-CM

## 2018-11-07 DIAGNOSIS — E782 Mixed hyperlipidemia: Secondary | ICD-10-CM | POA: Diagnosis not present

## 2018-11-07 DIAGNOSIS — E663 Overweight: Secondary | ICD-10-CM | POA: Diagnosis not present

## 2018-11-07 DIAGNOSIS — F4323 Adjustment disorder with mixed anxiety and depressed mood: Secondary | ICD-10-CM | POA: Diagnosis not present

## 2018-11-07 LAB — COMPREHENSIVE METABOLIC PANEL
ALT: 14 U/L (ref 0–35)
AST: 14 U/L (ref 0–37)
Albumin: 4.6 g/dL (ref 3.5–5.2)
Alkaline Phosphatase: 49 U/L (ref 39–117)
BUN: 18 mg/dL (ref 6–23)
CO2: 27 mEq/L (ref 19–32)
Calcium: 9.5 mg/dL (ref 8.4–10.5)
Chloride: 102 mEq/L (ref 96–112)
Creatinine, Ser: 0.82 mg/dL (ref 0.40–1.20)
GFR: 68.67 mL/min (ref >60.00)
Glucose, Bld: 91 mg/dL (ref 70–99)
Potassium: 4 mEq/L (ref 3.5–5.1)
Sodium: 138 mEq/L (ref 135–145)
Total Bilirubin: 0.7 mg/dL (ref 0.2–1.2)
Total Protein: 6.6 g/dL (ref 6.0–8.3)

## 2018-11-07 LAB — MAGNESIUM: Magnesium: 1.8 mg/dL (ref 1.5–2.5)

## 2018-11-07 MED ORDER — FENOFIBRATE 160 MG PO TABS: 160.0000 mg | ORAL_TABLET | Freq: Every day | ORAL | 1 refills | Status: DC

## 2018-11-07 MED ORDER — VALSARTAN-HYDROCHLOROTHIAZIDE 160-12.5 MG PO TABS: 1.0000 | ORAL_TABLET | Freq: Every day | ORAL | 0 refills | Status: DC
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
## 2018-11-10 ENCOUNTER — Encounter: Payer: Self-pay | Admitting: Family Medicine

## 2018-11-14 ENCOUNTER — Encounter: Payer: Self-pay | Admitting: Family Medicine

## 2018-11-14 ENCOUNTER — Other Ambulatory Visit: Payer: Self-pay

## 2018-11-14 ENCOUNTER — Ambulatory Visit (INDEPENDENT_AMBULATORY_CARE_PROVIDER_SITE_OTHER): Payer: Medicare Other | Admitting: Family Medicine

## 2018-11-14 VITALS — BP 148/88 | HR 87 | Temp 98.5°F | Ht 63.0 in | Wt 152.2 lb

## 2018-11-14 DIAGNOSIS — I10 Essential (primary) hypertension: Secondary | ICD-10-CM | POA: Diagnosis not present

## 2018-11-14 DIAGNOSIS — L7633 Postprocedural seroma of skin and subcutaneous tissue following a dermatologic procedure: Secondary | ICD-10-CM | POA: Diagnosis not present

## 2018-11-14 NOTE — Patient Instructions (Signed)
It was very nice to see you today!  You have a seroma.  I do not see any signs of infection.  Please continue your doxycycline to make sure this area does not get infected over the next week or so.  This area should gradually improve in size over the next few weeks.  Please let me know or let Dr. Juleen China know if you have any increasing pain, redness, or drainage from the area.  Please keep an eye on your blood pressures and let me or Dr. Juleen China know if they continue to be elevated above 140/90.  Take care, Dr Jerline Pain   Seroma A seroma is a collection of fluid on the body that looks like swelling or a mass. Seromas form where tissue has been injured or cut. Seromas vary in size. Some are small and painless. Others may become large and cause pain or discomfort. Many seromas go away on their own as the fluid is naturally absorbed by the body, and some seromas need to be drained. What are the causes? Seromas form as the result of damage to tissue or the removal of tissue. This tissue damage may occur during surgery or because of an injury or trauma. When tissue is disrupted or removed, empty space is created. The body's natural defense system (immune system) causes fluid to enter the empty space and form a seroma. What are the signs or symptoms? Symptoms of this condition include:  Swelling at the site of a surgical cut (incision) or an injury.  Drainage of clear fluid at the surgery or injury site.  Discomfort or pain. How is this diagnosed? This condition is diagnosed based on your symptoms, your medical history, and a physical exam. During the exam, your health care provider will press on the seroma. You may also have tests, including:  Blood tests.  Imaging tests, such as an ultrasound or CT scan. How is this treated? Some seromas go away (resolve) on their own. Your health care provider may monitor you to make sure the seroma does not cause any complications. If your seroma does not  resolve on its own, treatment may include:  Using a needle to drain the fluid from the seroma (needle aspiration).  Inserting a flexible tube (catheter) to drain the fluid.  Applying a bandage (dressing), such as an elastic bandage or binder.  Antibiotic medicines, if the seroma becomes infected. In rare cases, surgery may be done to remove the seroma and repair the area. Follow these instructions at home:   If you were prescribed an antibiotic medicine, take it as told by your health care provider. Do not stop taking the antibiotic even if you start to feel better.  Return to your normal activities as told by your health care provider. Ask your health care provider what activities are safe for you.  Take over-the-counter and prescription medicines only as told by your health care provider.  Check your seroma every day for signs of infection. Check for: ? Redness or pain. ? Fluid or pus. ? More swelling. ? Warmth.  Keep all follow-up visits as told by your health care provider. This is important. Contact a health care provider if:  You have a fever.  You have redness or pain at the site of the seroma.  You have fluid or pus coming from the seroma.  Your seroma is more swollen or is getting bigger.  Your seroma is warm to the touch. This information is not intended to replace advice given to  you by your health care provider. Make sure you discuss any questions you have with your health care provider. Document Released: 09/02/2012 Document Revised: 02/18/2016 Document Reviewed: 02/18/2016 Elsevier Interactive Patient Education  2019 [employer].

## 2018-11-14 NOTE — Progress Notes (Signed)
   Chief Complaint:  Crystal Patrick is a 73 y.o. female who presents for same day appointment with a chief complaint of improper wound healing.   Assessment/Plan:  Seroma Does not appear to be infected however recommended that she continue her course of doxycycline to prevent secondary infection.  Reassured patient on natural course of healing and that seromas can be a natural part of the healing process.  Anticipate full resolution over the next several weeks.  Essential Hypertension At goal per JNC 8 guidelines.  Continue valsartan-HCTZ 160-12.5mg  once daily. Continue home BP monitoring with goal 150/90 or lower.      Subjective:  HPI:  Cyst, acute problem Patient had cyst excised from posterior neck 2 weeks ago by her dermatologist.  About a week ago went to go back to have her stitches taken out.  She had a little bit of swelling and redness to the area at that time.  She was started on a course of doxycycline.  However last week or so she has had persistent swelling to the area.  Limited pain with palpation.  No drainage.  No fevers or chills.  She has not tried anything for the area. No other obvious alleviating or aggravating factors.    # Essential Hypertension She is currently on valsartan-HCTZ 160-12.5 once daily.  This was restarted by her PCP about a week ago.  Does not have any current chest pain or shortness of breath.  She has been tolerating this medication well without side effects.  ROS: Per HPI  PMH: She reports that she has never smoked. She has never used smokeless tobacco. She reports current alcohol use. She reports that she does not use drugs.      Objective:  Physical Exam: BP (!) 148/88 (BP Location: Left Arm, Patient Position: Sitting, Cuff Size: Normal)   Pulse 87   Temp 98.5 F (36.9 C) (Oral)   Ht 5\' 3"  (1.6 m)   Wt 152 lb 3.2 oz (69 kg)   SpO2 95%   BMI 26.96 kg/m   Gen: NAD, resting comfortably CV: Regular rate and rhythm with no murmurs  appreciated Pulm: Normal work of breathing, clear to auscultation bilaterally with no crackles, wheezes, or rhonchi Skin: Approximately 1 cm erythematous, fluctuant nodule on midline posterior neck.  No surrounding erythema.  Very mildly tender to palpation.      Algis Greenhouse. Jerline Pain, MD 11/14/2018 8:23 AM

## 2018-12-09 ENCOUNTER — Encounter (HOSPITAL_COMMUNITY): Payer: Self-pay

## 2018-12-09 ENCOUNTER — Emergency Department (HOSPITAL_COMMUNITY)
Admission: EM | Admit: 2018-12-09 | Discharge: 2018-12-09 | Discharge status: Against Medical Advice | Payer: Medicare Other | Attending: Emergency Medicine | Admitting: Emergency Medicine

## 2018-12-09 ENCOUNTER — Other Ambulatory Visit: Payer: Self-pay

## 2018-12-09 DIAGNOSIS — Y999 Unspecified external cause status: Secondary | ICD-10-CM | POA: Insufficient documentation

## 2018-12-09 DIAGNOSIS — Z5321 Procedure and treatment not carried out due to patient leaving prior to being seen by health care provider: Secondary | ICD-10-CM | POA: Diagnosis not present

## 2018-12-09 DIAGNOSIS — Y9389 Activity, other specified: Secondary | ICD-10-CM | POA: Diagnosis not present

## 2018-12-09 DIAGNOSIS — Y92003 Bedroom of unspecified non-institutional (private) residence as the place of occurrence of the external cause: Secondary | ICD-10-CM | POA: Insufficient documentation

## 2018-12-09 DIAGNOSIS — W01190A Fall on same level from slipping, tripping and stumbling with subsequent striking against furniture, initial encounter: Secondary | ICD-10-CM | POA: Diagnosis not present

## 2018-12-09 DIAGNOSIS — S0101XA Laceration without foreign body of scalp, initial encounter: Secondary | ICD-10-CM | POA: Diagnosis present

## 2018-12-09 NOTE — ED Notes (Signed)
Pt stated her head is no longer bleeding. Pt advised to stay, but stated she no longer wanted to be seen.

## 2018-12-09 NOTE — ED Triage Notes (Signed)
Pt states she fell last night, hitting her head on the nightstand. Pt states she takes sleeping meds and got up in the middle of the night. Pt states she has a lac on the back left side of her head. Denies blood thinners.

## 2019-02-07 ENCOUNTER — Ambulatory Visit: Payer: Medicare Other | Admitting: Family Medicine

## 2019-02-07 ENCOUNTER — Other Ambulatory Visit: Payer: Self-pay

## 2019-02-07 DIAGNOSIS — E782 Mixed hyperlipidemia: Secondary | ICD-10-CM

## 2019-02-07 MED ORDER — FENOFIBRATE 160 MG PO TABS: 160.0000 mg | ORAL_TABLET | Freq: Every day | ORAL | 1 refills | Status: DC

## 2019-02-10 DIAGNOSIS — Z23 Encounter for immunization: Secondary | ICD-10-CM | POA: Diagnosis not present

## 2019-02-16 NOTE — Progress Notes (Deleted)
Crystal Patrick is a 73 y.o. female is here for follow up.  History of Present Illness:   (SCRIBE ATTESTATION)  HPI:   Health Maintenance Due  Topic Date Due  . MAMMOGRAM  10/21/2015  . TETANUS/TDAP  05/22/2018  . INFLUENZA VACCINE  12/21/2018   Depression screen [facility] 2/9 11/07/2018 02/05/2018 10/08/2017  Decreased Interest 1 3 1   Down, Depressed, Hopeless 1 3 1   PHQ - 2 Score 2 6 2   Altered sleeping 1 3 1   Tired, decreased energy 1 3 1   Change in appetite 1 2 1   Feeling bad or failure about yourself  1 0 0  Trouble concentrating 1 3 1   Moving slowly or fidgety/restless 1 0 0  Suicidal thoughts 0 0 0  PHQ-9 Score 8 17 6   Difficult doing work/chores Not difficult at all - -   PMHx, SurgHx, SocialHx, FamHx, Medications, and Allergies were reviewed in the Visit Navigator and updated as appropriate.   Patient Active Problem List   Diagnosis Date Noted  . Chronic calculus cholecystitis s/p lap cholecystectomy 09/01/2018 09/01/2018  . MDD (major depressive disorder) 03/17/2016  . BMI 29.0-29.9,adult 03/17/2016  . Hx of osteoporosis 03/17/2016  . Essential hypertension 10/30/2014  . Seasonal allergies 10/30/2014  . GERD (gastroesophageal reflux disease) 06/02/2013  . Hyperlipidemia 06/02/2013  . Overactive bladder 06/02/2013   Social History   Tobacco Use  . Smoking status: Never Smoker  . Smokeless tobacco: Never Used  Substance Use Topics  . Alcohol use: Yes    Alcohol/week: 0.0 standard drinks    Comment: one drink a day  . Drug use: No   Current Medications and Allergies   Current Outpatient Medications:  .  buPROPion (WELLBUTRIN XL) 150 MG 24 hr tablet, Take 150 mg by mouth daily. , Disp: , Rfl:  .  BusPIRone HCl (BUSPAR PO), Take 60 mg by mouth 2 (two) times daily. Total of 60 mg daily, Disp: , Rfl:  .  Cholecalciferol (VITAMIN D3) 125 MCG (5000 UT) TABS, Take 5,000 Units by mouth daily. , Disp: , Rfl:  .  doxycycline (VIBRA-TABS) 100 MG tablet, Take 100 mg by  mouth 2 (two) times daily., Disp: , Rfl:  .  esomeprazole (NEXIUM) 20 MG capsule, Take 40 mg by mouth at bedtime. , Disp: , Rfl:  .  fenofibrate 160 MG tablet, Take 1 tablet (160 mg total) by mouth daily., Disp: 90 tablet, Rfl: 1 .  FLUoxetine (PROZAC) 40 MG capsule, Take 40 mg by mouth daily. , Disp: , Rfl:  .  magnesium gluconate (MAGONATE) 500 MG tablet, Take 500 mg by mouth daily., Disp: , Rfl:  .  potassium gluconate 595 (99 K) MG TABS tablet, Take 99 mg by mouth daily. , Disp: , Rfl:  .  temazepam (RESTORIL) 15 MG capsule, Take 30 mg by mouth at bedtime. , Disp: , Rfl:  .  tolterodine (DETROL LA) 4 MG 24 hr capsule, Take 1 capsule (4 mg total) by mouth daily., Disp: 90 capsule, Rfl: 2 .  valsartan-hydrochlorothiazide (DIOVAN-HCT) 160-12.5 MG tablet, Take 1 tablet by mouth daily for 30 days., Disp: 90 tablet, Rfl: 0 .  vitamin E 400 UNIT capsule, Take 400 Units by mouth daily. , Disp: , Rfl:   Allergies  Allergen Reactions  . Other     Hormone replacement therapy-per patient she cannot recall reaction  . Statins     Liver failure   Review of Systems   Pertinent items are noted in the HPI.  Otherwise, a complete ROS is negative.  Vitals  There were no vitals filed for this visit.   There is no height or weight on file to calculate BMI.  Physical Exam   Physical Exam  Results for orders placed or performed in visit on 11/07/18  Comprehensive metabolic panel  Result Value Ref Range   Sodium 138 135 - 145 mEq/L   Potassium 4.0 3.5 - 5.1 mEq/L   Chloride 102 96 - 112 mEq/L   CO2 27 19 - 32 mEq/L   Glucose, Bld 91 70 - 99 mg/dL   BUN 18 6 - 23 mg/dL   Creatinine, Ser 0.82 0.40 - 1.20 mg/dL   Total Bilirubin 0.7 0.2 - 1.2 mg/dL   Alkaline Phosphatase 49 39 - 117 U/L   AST 14 0 - 37 U/L   ALT 14 0 - 35 U/L   Total Protein 6.6 6.0 - 8.3 g/dL   Albumin 4.6 3.5 - 5.2 g/dL   Calcium 9.5 8.4 - 10.5 mg/dL   GFR 68.67 >60.00 mL/min  Magnesium  Result Value Ref Range   Magnesium  1.8 1.5 - 2.5 mg/dL    Assessment and Plan   There are no diagnoses linked to this encounter.  . Orders and follow up as documented in [place], reviewed diet, exercise and weight control, cardiovascular risk and specific lipid/LDL goals reviewed, reviewed medications and side effects in detail.  . Reviewed expectations re: course of current medical issues. . Outlined signs and symptoms indicating need for more acute intervention. . Patient verbalized understanding and all questions were answered. . Patient received an After Visit Summary.  *** CMA served as Education administrator during this visit. History, Physical, and Plan performed by medical provider. The above documentation has been reviewed and is accurate and complete. Briscoe Deutscher, D.O.  Briscoe Deutscher, DO [place], [facility] 02/16/2019

## 2019-02-17 ENCOUNTER — Ambulatory Visit: Payer: Medicare Other | Admitting: Family Medicine

## 2019-02-17 ENCOUNTER — Ambulatory Visit (INDEPENDENT_AMBULATORY_CARE_PROVIDER_SITE_OTHER): Payer: Medicare Other

## 2019-02-17 ENCOUNTER — Other Ambulatory Visit: Payer: Self-pay

## 2019-02-17 VITALS — BP 132/78 | Temp 98.1°F | Ht 63.0 in | Wt 151.8 lb

## 2019-02-17 DIAGNOSIS — Z0000 Encounter for general adult medical examination without abnormal findings: Secondary | ICD-10-CM

## 2019-02-17 NOTE — Patient Instructions (Signed)
Ms. Crystal Patrick , Thank you for taking time to come for your Medicare Wellness Visit. I appreciate your ongoing commitment to your health goals. Please review the following plan we discussed and let me know if I can assist you in the future.   Screening recommendations/referrals: Colorectal Screening: completed 08/05/14 with Dr. Fuller Plan Mammogram: recommended  Bone Density: recommended   Vision and Dental Exams: Recommended annual ophthalmology exams for early detection of glaucoma and other disorders of the eye Recommended annual dental exams for proper oral hygiene  Vaccinations: Influenza vaccine: completed  Pneumococcal vaccine: up to date; last 07/28/14 Tdap vaccine: recommended; Please call your insurance company to determine your out of pocket expense. You may also receive this vaccine at your local pharmacy or Health Dept. Shingles vaccine: Please call your insurance company to determine your out of pocket expense for the Shingrix vaccine. You may receive this vaccine at your local pharmacy.  Advanced directives: Please bring a copy of your POA (Power of Attorney) and/or Living Will to your next appointment.  Goals: Recommend to drink at least 6-8 8oz glasses of water per day and incorporate plenty of fresh fruits and vegetables into diet.   Next appointment: Please schedule your Annual Wellness Visit with your Nurse Health Advisor in one year.  Preventive Care 71 Years and Older, Female Preventive care refers to lifestyle choices and visits with your health care provider that can promote health and wellness. What does preventive care include?  A yearly physical exam. This is also called an annual well check.  Dental exams once or twice a year.  Routine eye exams. Ask your health care provider how often you should have your eyes checked.  Personal lifestyle choices, including:  Daily care of your teeth and gums.  Regular physical activity.  Eating a healthy diet.  Avoiding  tobacco and drug use.  Limiting alcohol use.  Practicing safe sex.  Taking low-dose aspirin every day if recommended by your health care provider.  Taking vitamin and mineral supplements as recommended by your health care provider. What happens during an annual well check? The services and screenings done by your health care provider during your annual well check will depend on your age, overall health, lifestyle risk factors, and family history of disease. Counseling  Your health care provider may ask you questions about your:  Alcohol use.  Tobacco use.  Drug use.  Emotional well-being.  Home and relationship well-being.  Sexual activity.  Eating habits.  History of falls.  Memory and ability to understand (cognition).  Work and work Statistician.  Reproductive health. Screening  You may have the following tests or measurements:  Height, weight, and BMI.  Blood pressure.  Lipid and cholesterol levels. These may be checked every 5 years, or more frequently if you are over 76 years old.  Skin check.  Lung cancer screening. You may have this screening every year starting at age 34 if you have a 30-pack-year history of smoking and currently smoke or have quit within the past 15 years.  Fecal occult blood test (FOBT) of the stool. You may have this test every year starting at age 52.  Flexible sigmoidoscopy or colonoscopy. You may have a sigmoidoscopy every 5 years or a colonoscopy every 10 years starting at age 96.  Hepatitis C blood test.  Hepatitis B blood test.  Sexually transmitted disease (STD) testing.  Diabetes screening. This is done by checking your blood sugar (glucose) after you have not eaten for a while (fasting).  You may have this done every 1-3 years.  Bone density scan. This is done to screen for osteoporosis. You may have this done starting at age 77.  Mammogram. This may be done every 1-2 years. Talk to your health care provider about how  often you should have regular mammograms. Talk with your health care provider about your test results, treatment options, and if necessary, the need for more tests. Vaccines  Your health care provider may recommend certain vaccines, such as:  Influenza vaccine. This is recommended every year.  Tetanus, diphtheria, and acellular pertussis (Tdap, Td) vaccine. You may need a Td booster every 10 years.  Zoster vaccine. You may need this after age 60.  Pneumococcal 13-valent conjugate (PCV13) vaccine. One dose is recommended after age 45.  Pneumococcal polysaccharide (PPSV23) vaccine. One dose is recommended after age 56. Talk to your health care provider about which screenings and vaccines you need and how often you need them. This information is not intended to replace advice given to you by your health care provider. Make sure you discuss any questions you have with your health care provider. Document Released: 06/04/2015 Document Revised: 01/26/2016 Document Reviewed: 03/09/2015 Elsevier Interactive Patient Education  2017 [place] Prevention in the Home Falls can cause injuries. They can happen to people of all ages. There are many things you can do to make your home safe and to help prevent falls. What can I do on the outside of my home?  Regularly fix the edges of walkways and driveways and fix any cracks.  Remove anything that might make you trip as you walk through a door, such as a raised step or threshold.  Trim any bushes or trees on the path to your home.  Use bright outdoor lighting.  Clear any walking paths of anything that might make someone trip, such as rocks or tools.  Regularly check to see if handrails are loose or broken. Make sure that both sides of any steps have handrails.  Any raised decks and porches should have guardrails on the edges.  Have any leaves, snow, or ice cleared regularly.  Use sand or salt on walking paths during winter.  Clean  up any spills in your garage right away. This includes oil or grease spills. What can I do in the bathroom?  Use night lights.  Install grab bars by the toilet and in the tub and shower. Do not use towel bars as grab bars.  Use non-skid mats or decals in the tub or shower.  If you need to sit down in the shower, use a plastic, non-slip stool.  Keep the floor dry. Clean up any water that spills on the floor as soon as it happens.  Remove soap buildup in the tub or shower regularly.  Attach bath mats securely with double-sided non-slip rug tape.  Do not have throw rugs and other things on the floor that can make you trip. What can I do in the bedroom?  Use night lights.  Make sure that you have a light by your bed that is easy to reach.  Do not use any sheets or blankets that are too big for your bed. They should not hang down onto the floor.  Have a firm chair that has side arms. You can use this for support while you get dressed.  Do not have throw rugs and other things on the floor that can make you trip. What can I do in the kitchen?  Clean up any spills right away.  Avoid walking on wet floors.  Keep items that you use a lot in easy-to-reach places.  If you need to reach something above you, use a strong step stool that has a grab bar.  Keep electrical cords out of the way.  Do not use floor polish or wax that makes floors slippery. If you must use wax, use non-skid floor wax.  Do not have throw rugs and other things on the floor that can make you trip. What can I do with my stairs?  Do not leave any items on the stairs.  Make sure that there are handrails on both sides of the stairs and use them. Fix handrails that are broken or loose. Make sure that handrails are as long as the stairways.  Check any carpeting to make sure that it is firmly attached to the stairs. Fix any carpet that is loose or worn.  Avoid having throw rugs at the top or bottom of the stairs.  If you do have throw rugs, attach them to the floor with carpet tape.  Make sure that you have a light switch at the top of the stairs and the bottom of the stairs. If you do not have them, ask someone to add them for you. What else can I do to help prevent falls?  Wear shoes that:  Do not have high heels.  Have rubber bottoms.  Are comfortable and fit you well.  Are closed at the toe. Do not wear sandals.  If you use a stepladder:  Make sure that it is fully opened. Do not climb a closed stepladder.  Make sure that both sides of the stepladder are locked into place.  Ask someone to hold it for you, if possible.  Clearly mark and make sure that you can see:  Any grab bars or handrails.  First and last steps.  Where the edge of each step is.  Use tools that help you move around (mobility aids) if they are needed. These include:  Canes.  Walkers.  Scooters.  Crutches.  Turn on the lights when you go into a dark area. Replace any light bulbs as soon as they burn out.  Set up your furniture so you have a clear path. Avoid moving your furniture around.  If any of your floors are uneven, fix them.  If there are any pets around you, be aware of where they are.  Review your medicines with your doctor. Some medicines can make you feel dizzy. This can increase your chance of falling. Ask your doctor what other things that you can do to help prevent falls. This information is not intended to replace advice given to you by your health care provider. Make sure you discuss any questions you have with your health care provider. Document Released: 03/04/2009 Document Revised: 10/14/2015 Document Reviewed: 06/12/2014 Elsevier Interactive Patient Education  2017 [employer].

## 2019-02-17 NOTE — Progress Notes (Addendum)
Subjective:   Crystal Patrick is a 73 y.o. female who presents for Medicare Annual (Subsequent) preventive examination.  Review of Systems:   Cardiac Risk Factors include: advanced age (>52men, >91 women);hypertension     Objective:     Vitals: BP 132/78 (BP Location: Left Arm, Patient Position: Sitting, Cuff Size: Normal)   Temp 98.1 F (36.7 C) (Temporal)   Ht 5\' 3"  (1.6 m)   Wt 151 lb 12.8 oz (68.9 kg)   BMI 26.89 kg/m   Body mass index is 26.89 kg/m.  Advanced Directives 02/17/2019 12/09/2018 08/31/2018 08/31/2018 03/16/2015 01/27/2014  Does Patient Have a Medical Advance Directive? Yes No Yes No Yes Yes  Type of Advance Directive Living will;Healthcare Power of Attorney - Living will;Healthcare Power of [place];Living will [place];Living will  Does patient want to make changes to medical advance directive? No - Patient declined - No - Patient declined - No - Patient declined No - Patient declined  Copy of [place] in Chart? No - copy requested - - - No - copy requested No - copy requested  Would patient like information on creating a medical advance directive? - Yes (ED - Information included in AVS) - No - Patient declined - -    Tobacco Social History   Tobacco Use  Smoking Status Never Smoker  Smokeless Tobacco Never Used     Counseling given: Not Answered   Clinical Intake:  Pre-visit preparation completed: Yes  Pain : No/denies pain  Diabetes: No  How often do you need to have someone help you when you read instructions, pamphlets, or other written materials from your doctor or pharmacy?: 1 - Never  Interpreter Needed?: No  Information entered by :: Denman George LPN  Past Medical History:  Diagnosis Date  . Anxiety and depression    sees psychiatrist for this  . Arthritis   . Chicken pox   . Gallstone pancreatitis 08/31/2018  . GERD (gastroesophageal reflux disease)    with  hiatal hernia, esophageal stricture  . Hiatal hernia   . Hx of osteoporosis 03/17/2016   -did not tol bisphosphonates -did injections -does not wish to re-assess or repeat DEXA -doing walking and Vit D  . Hyperlipidemia    has refused treatment or further evaluation other then lifestyle changes  . Hypertension   . Liver failure ([place])    due to [place]  . Overactive bladder 06/02/2013  . Peptic ulcer   . Seasonal allergies   . Squamous cell carcinoma   . Status post dilation of esophageal narrowing    Past Surgical History:  Procedure Laterality Date  . APPENDECTOMY  1964  . BUNIONECTOMY Bilateral     1 foot x 2,   . CHOLECYSTECTOMY N/A 09/01/2018   Procedure: LAPAROSCOPIC CHOLECYSTECTOMY WITH INTRAOPERATIVE CHOLANGIOGRAM;  Surgeon: Ileana Roup, MD;  Location: [facility];  Service: General;  Laterality: N/A;  . NASAL/SINUS ENDOSCOPY     x 3  . TONSILLECTOMY AND ADENOIDECTOMY     Family History  Problem Relation Age of Onset  . Throat cancer Father   . Heart disease Father   . Thyroid cancer Sister   . Ovarian cancer Paternal Aunt   . Prostate cancer Brother   . Prostate cancer Maternal Uncle        x 2  . Bladder Cancer Maternal Uncle   . Diabetes Daughter   . Dementia Mother    Social History  Socioeconomic History  . Marital status: Married    Spouse name: Not on file  . Number of children: 3  . Years of education: Not on file  . Highest education level: Not on file  Occupational History  . Occupation: home maker  Social Needs  . Financial resource strain: Not on file  . Food insecurity    Worry: Not on file    Inability: Not on file  . Transportation needs    Medical: Not on file    Non-medical: Not on file  Tobacco Use  . Smoking status: Never Smoker  . Smokeless tobacco: Never Used  Substance and Sexual Activity  . Alcohol use: Yes    Alcohol/week: 0.0 standard drinks    Comment: one drink a day  . Drug use: No  . Sexual activity: Not on file   Lifestyle  . Physical activity    Days per week: Not on file    Minutes per session: Not on file  . Stress: Not on file  Relationships  . Social Herbalist on phone: Not on file    Gets together: Not on file    Attends religious service: Not on file    Active member of club or organization: Not on file    Attends meetings of clubs or organizations: Not on file    Relationship status: Not on file  Other Topics Concern  . Not on file  Social History Narrative   Work or School: retired Web designer Situation: lives with husband, daughter and 2 children      Spiritual Beliefs: Christian, Catholic      Lifestyle: no regular exercise; diet is good              Outpatient Encounter Medications as of 02/17/2019  Medication Sig  . buPROPion (WELLBUTRIN XL) 150 MG 24 hr tablet Take 150 mg by mouth daily.   . BusPIRone HCl (BUSPAR PO) Take 60 mg by mouth 2 (two) times daily. Total of 60 mg daily  . Cholecalciferol (VITAMIN D3) 125 MCG (5000 UT) TABS Take 5,000 Units by mouth daily.   Marland Kitchen doxycycline (VIBRA-TABS) 100 MG tablet Take 100 mg by mouth 2 (two) times daily.  Marland Kitchen esomeprazole (NEXIUM) 20 MG capsule Take 40 mg by mouth at bedtime.   . fenofibrate 160 MG tablet Take 1 tablet (160 mg total) by mouth daily.  Marland Kitchen FLUoxetine (PROZAC) 40 MG capsule Take 40 mg by mouth daily.   . magnesium gluconate (MAGONATE) 500 MG tablet Take 500 mg by mouth daily.  . potassium gluconate 595 (99 K) MG TABS tablet Take 99 mg by mouth daily.   . temazepam (RESTORIL) 15 MG capsule Take 30 mg by mouth at bedtime.   . tolterodine (DETROL LA) 4 MG 24 hr capsule Take 1 capsule (4 mg total) by mouth daily.  . vitamin E 400 UNIT capsule Take 400 Units by mouth daily.   . valsartan-hydrochlorothiazide (DIOVAN-HCT) 160-12.5 MG tablet Take 1 tablet by mouth daily for 30 days.   No facility-administered encounter medications on file as of 02/17/2019.     Activities of Daily Living In your  present state of health, do you have any difficulty performing the following activities: 02/17/2019 08/31/2018  Hearing? N N  Vision? N N  Difficulty concentrating or making decisions? N Y  Walking or climbing stairs? N N  Dressing or bathing? N N  Doing errands, shopping? N [employer]  and eating ? N -  Using the Toilet? N -  In the past six months, have you accidently leaked urine? N -  Do you have problems with loss of bowel control? N -  Managing your Medications? N -  Managing your Finances? N -  Housekeeping or managing your Housekeeping? N -  Some recent data might be hidden    Patient Care Team: Briscoe Deutscher, DO as PCP - General (Family Medicine) [name], MD as Consulting Physician (Psychiatry) Rutherford Guys, MD as Consulting Physician (Ophthalmology) Druscilla Brownie, MD as Consulting Physician (Dermatology)    Assessment:   This is a routine wellness examination for Yazlin.  Exercise Activities and Dietary recommendations Current Exercise Habits: The patient does not participate in regular exercise at present  Goals    . Patient Stated     Find healthy ways to reduce stress        Fall Risk Fall Risk  02/17/2019 11/07/2018 02/05/2018 03/17/2016 03/16/2015  Falls in the past year? 1 0 No No Yes  Number falls in past yr: 0 0 - - 1  Injury with Fall? 1 0 - - No  Risk for fall due to : Medication side effect - - - -  Follow up Education provided;Falls evaluation completed;Falls prevention discussed - - - Education provided;Falls prevention discussed   Is the patient's home free of loose throw rugs in walkways, pet beds, electrical cords, etc?   yes      Grab bars in the bathroom? yes      Handrails on the stairs?   yes      Adequate lighting?   yes  Timed Get Up and Go performed: completed and within normal timeframe; no gait abnormalities noted    Depression Screen PHQ 2/9 Scores 02/17/2019 11/07/2018 02/05/2018 10/08/2017  PHQ - 2 Score 2 2 6 2    PHQ- 9 Score - 8 17 6   Exception Documentation - - - -     Cognitive Function-no cognitive concerns at this time attributes forgetfulness to stress levels      6CIT Screen 02/17/2019  What Year? 0 points  What month? 0 points  What time? 0 points  Count back from 20 0 points  Months in reverse 0 points  Repeat phrase 0 points  Total Score 0    Immunization History  Administered Date(s) Administered  . Fluad Quad(high Dose 65+) 02/10/2019  . Influenza, High Dose Seasonal PF 03/16/2015, 03/17/2016, 02/21/2017, 02/05/2018  . Influenza,inj,Quad PF,6+ Mos 01/22/2014  . Influenza-Unspecified 02/24/2014  . Pneumococcal Conjugate-13 01/22/2014  . Pneumococcal Polysaccharide-23 07/28/2014    Qualifies for Shingles Vaccine? Discussed and patient will check with pharmacy for coverage.  Patient education handout provided.  Patient states that pharmacy advised her to get vaccine at pcp office due to insurance.    Screening Tests Health Maintenance  Topic Date Due  . MAMMOGRAM  10/21/2015  . TETANUS/TDAP  05/22/2018  . COLONOSCOPY  08/05/2019  . INFLUENZA VACCINE  Completed  . DEXA SCAN  Completed  . Hepatitis C Screening  Completed  . PNA vac Low Risk Adult  Completed    Cancer Screenings: Lung: Low Dose CT Chest recommended if Age 29-80 years, 30 pack-year currently smoking OR have quit w/in 15years. Patient does not qualify. Breast:  Up to date on Mammogram? No, patient declines at this time  Up to date of Bone Density/Dexa? No; patient declines at this time  Colorectal: colonoscopy 08/05/14 with Dr. Fuller Plan     Plan:  I have personally reviewed and addressed the Medicare Annual Wellness questionnaire and have noted the following in the patient's chart:  A. Medical and social history B. Use of alcohol, tobacco or illicit drugs  C. Current medications and supplements D. Functional ability and status E.  Nutritional status F.  Physical activity G. Advance directives H.  List of other physicians I.  Hospitalizations, surgeries, and ER visits in previous 12 months J.  [place] such as hearing and vision if needed, cognitive and depression L. Referrals, records requested, and appointments- none   In addition, I have reviewed and discussed with patient certain preventive protocols, quality metrics, and best practice recommendations. A written personalized care plan for preventive services as well as general preventive health recommendations were provided to patient.   Signed,  Denman George, LPN  Nurse Health Advisor   Nurse Notes: Patient with fall in June.  She states that she thinks it was due to increase in blood pressure medication.  She is now only taking 1/2 of Valsartan.  She is asking for refills of Valsartan and Fenofibrate.   I have reviewed documentation for AWV and Advance Care planning provided by [facility], I agree with documentation, I was immediately available for any questions. Inda Coke, [place]  I reviewed the nursing notes, and patient appears to have a follow-up visit with her PCP on October 1.  Agree with this office visit in order to discuss patient's medical concerns.

## 2019-02-20 ENCOUNTER — Other Ambulatory Visit: Payer: Self-pay

## 2019-02-20 ENCOUNTER — Encounter: Payer: Self-pay | Admitting: Family Medicine

## 2019-02-20 ENCOUNTER — Ambulatory Visit (INDEPENDENT_AMBULATORY_CARE_PROVIDER_SITE_OTHER): Payer: Medicare Other | Admitting: Family Medicine

## 2019-02-20 VITALS — BP 122/78 | HR 77 | Temp 98.4°F | Ht 63.0 in | Wt 152.0 lb

## 2019-02-20 DIAGNOSIS — F3341 Major depressive disorder, recurrent, in partial remission: Secondary | ICD-10-CM | POA: Diagnosis not present

## 2019-02-20 DIAGNOSIS — Z6829 Body mass index (BMI) 29.0-29.9, adult: Secondary | ICD-10-CM | POA: Diagnosis not present

## 2019-02-20 DIAGNOSIS — Z23 Encounter for immunization: Secondary | ICD-10-CM

## 2019-02-20 DIAGNOSIS — E782 Mixed hyperlipidemia: Secondary | ICD-10-CM | POA: Diagnosis not present

## 2019-02-20 DIAGNOSIS — I10 Essential (primary) hypertension: Secondary | ICD-10-CM

## 2019-02-20 DIAGNOSIS — M816 Localized osteoporosis [Lequesne]: Secondary | ICD-10-CM | POA: Diagnosis not present

## 2019-02-20 MED ORDER — TETANUS-DIPHTH-ACELL PERTUSSIS 5-2.5-18.5 LF-MCG/0.5 IM SUSP: 0.5000 mL | Freq: Once | INTRAMUSCULAR | Status: DC

## 2019-02-20 MED ORDER — TETANUS-DIPHTH-ACELL PERTUSSIS 5-2.5-18.5 LF-MCG/0.5 IM SUSP: 0.5000 mL | Freq: Once | INTRAMUSCULAR | 0 refills | Status: AC

## 2019-02-20 NOTE — Progress Notes (Signed)
Crystal Patrick is a 73 y.o. female is here for follow up.  History of Present Illness:   HPI:   Hypertension Review: taking medications as instructed, no medication side effects noted, no TIAs, no chest pain on exertion, no dyspnea on exertion, no swelling of ankles. Smoker: No.   BP Readings from Last 3 Encounters:  02/20/19 122/78  02/17/19 132/78  11/14/18 (!) 148/88   Lab Results  Component Value Date   CREATININE 0.82 11/07/2018   CREATININE 1.07 09/06/2018   CREATININE 0.79 09/02/2018     Health Maintenance Due  Topic Date Due  . TETANUS/TDAP  05/22/2018   Depression screen PHQ 2/9 02/20/2019 02/17/2019 11/07/2018  Decreased Interest 0 1 1  Down, Depressed, Hopeless 0 1 1  PHQ - 2 Score 0 2 2  Altered sleeping 0 - 1  Tired, decreased energy 0 - 1  Change in appetite 0 - 1  Feeling bad or failure about yourself  0 - 1  Trouble concentrating 0 - 1  Moving slowly or fidgety/restless 0 - 1  Suicidal thoughts 0 - 0  PHQ-9 Score 0 - 8  Difficult doing work/chores Not difficult at all - Not difficult at all   PMHx, SurgHx, SocialHx, FamHx, Medications, and Allergies were reviewed in the Visit Navigator and updated as appropriate.   Patient Active Problem List   Diagnosis Date Noted  . Chronic calculus cholecystitis s/p lap cholecystectomy 09/01/2018 09/01/2018  . MDD (major depressive disorder) 03/17/2016  . BMI 29.0-29.9,adult 03/17/2016  . Hx of osteoporosis 03/17/2016  . Essential hypertension 10/30/2014  . Seasonal allergies 10/30/2014  . GERD (gastroesophageal reflux disease) 06/02/2013  . Hyperlipidemia 06/02/2013  . Overactive bladder 06/02/2013   Social History   Tobacco Use  . Smoking status: Never Smoker  . Smokeless tobacco: Never Used  Substance Use Topics  . Alcohol use: Yes    Alcohol/week: 0.0 standard drinks    Comment: one drink a day  . Drug use: No   Current Medications and Allergies   Current Outpatient Medications:  .  buPROPion  (WELLBUTRIN XL) 150 MG 24 hr tablet, Take 150 mg by mouth daily. , Disp: , Rfl:  .  BusPIRone HCl (BUSPAR PO), Take 60 mg by mouth 2 (two) times daily. Total of 60 mg daily, Disp: , Rfl:  .  Cholecalciferol (VITAMIN D3) 125 MCG (5000 UT) TABS, Take 5,000 Units by mouth daily. , Disp: , Rfl:  .  esomeprazole (NEXIUM) 20 MG capsule, Take 40 mg by mouth at bedtime. , Disp: , Rfl:  .  fenofibrate 160 MG tablet, Take 1 tablet (160 mg total) by mouth daily., Disp: 90 tablet, Rfl: 1 .  potassium gluconate 595 (99 K) MG TABS tablet, Take 99 mg by mouth daily. , Disp: , Rfl:  .  temazepam (RESTORIL) 15 MG capsule, Take 30 mg by mouth at bedtime. , Disp: , Rfl:  .  tolterodine (DETROL LA) 4 MG 24 hr capsule, Take 1 capsule (4 mg total) by mouth daily., Disp: 90 capsule, Rfl: 2 .  valsartan-hydrochlorothiazide (DIOVAN-HCT) 160-12.5 MG tablet, Take 1 tablet by mouth daily for 30 days. (Patient taking differently: Take 1 tablet by mouth daily. Taking 80/12.5 daily), Disp: 90 tablet, Rfl: 0 .  vitamin E 400 UNIT capsule, Take 400 Units by mouth daily. , Disp: , Rfl:  .  FLUoxetine (PROZAC) 40 MG capsule, Take 40 mg by mouth daily. , Disp: , Rfl:  .  magnesium gluconate (MAGONATE) 500 MG  tablet, Take 500 mg by mouth daily., Disp: , Rfl:    Allergies  Allergen Reactions  . Other     Hormone replacement therapy-per patient she cannot recall reaction  . Statins     Liver failure   Review of Systems   Pertinent items are noted in the HPI. Otherwise, a complete ROS is negative.  Vitals   Vitals:   02/20/19 0940  BP: 122/78  Pulse: 77  Temp: 98.4 F (36.9 C)  TempSrc: Temporal  SpO2: 96%  Weight: 152 lb (68.9 kg)  Height: 5' 3"  (1.6 m)     Body mass index is 26.93 kg/m.  Physical Exam   Physical Exam Vitals signs and nursing note reviewed.  HENT:     Head: Normocephalic and atraumatic.  Eyes:     Pupils: Pupils are equal, round, and reactive to light.  Neck:     Musculoskeletal: Normal  range of motion and neck supple.  Cardiovascular:     Rate and Rhythm: Normal rate and regular rhythm.     Heart sounds: Normal heart sounds.  Pulmonary:     Effort: Pulmonary effort is normal.  Abdominal:     Palpations: Abdomen is soft.  Skin:    General: Skin is warm.  Psychiatric:        Behavior: Behavior normal.    Assessment and Plan   Crystal Patrick was seen today for follow-up.  Diagnoses and all orders for this visit:  Essential hypertension -     Comp Met (CMET); Future  Need for Tdap vaccination -     Tdap (BOOSTRIX) 5-2.5-18.5 LF-MCG/0.5 injection; Inject 0.5 mLs into the muscle once for 1 dose.  BMI 29.0-29.9,adult  Mixed hyperlipidemia  Recurrent major depressive disorder, in partial remission ([place]) Comments: Followed by Psychiatry. Buspar recently increased.  Localized osteoporosis without current pathological fracture Comments: s/p treatment. Declines further testing or treatment.   . Orders and follow up as documented in [place], reviewed diet, exercise and weight control, cardiovascular risk and specific lipid/LDL goals reviewed, reviewed medications and side effects in detail.  . Reviewed expectations re: course of current medical issues. . Outlined signs and symptoms indicating need for more acute intervention. . Patient verbalized understanding and all questions were answered. . Patient received an After Visit Summary.  Briscoe Deutscher, DO [place], [facility] 02/21/2019

## 2019-02-25 DIAGNOSIS — L720 Epidermal cyst: Secondary | ICD-10-CM | POA: Diagnosis not present

## 2019-03-03 NOTE — Addendum Note (Signed)
Addended by: Francella Solian on: 03/03/2019 10:32 PM   Modules accepted: Orders

## 2019-03-19 ENCOUNTER — Encounter: Payer: Medicare Other | Admitting: Family Medicine

## 2019-03-19 DIAGNOSIS — F331 Major depressive disorder, recurrent, moderate: Secondary | ICD-10-CM | POA: Diagnosis not present

## 2019-03-31 ENCOUNTER — Encounter: Payer: Medicare Other | Admitting: Family Medicine

## 2019-04-10 DIAGNOSIS — L905 Scar conditions and fibrosis of skin: Secondary | ICD-10-CM | POA: Diagnosis not present

## 2019-04-22 ENCOUNTER — Ambulatory Visit (INDEPENDENT_AMBULATORY_CARE_PROVIDER_SITE_OTHER): Payer: Medicare Other

## 2019-04-22 ENCOUNTER — Other Ambulatory Visit: Payer: Self-pay

## 2019-04-22 DIAGNOSIS — Z23 Encounter for immunization: Secondary | ICD-10-CM | POA: Diagnosis not present

## 2019-04-22 NOTE — Progress Notes (Signed)
Per orders of Inda Coke, [place], injection of Shingrix 2 given by Serita Sheller, RMA in left deltoid. Patient tolerated injection well. Series completed. Patient needs no further appointment.

## 2019-04-23 ENCOUNTER — Ambulatory Visit: Payer: Medicare Other

## 2019-05-08 ENCOUNTER — Ambulatory Visit: Payer: Self-pay

## 2019-05-08 NOTE — Telephone Encounter (Signed)
Patient scheduled.

## 2019-05-08 NOTE — Telephone Encounter (Signed)
See note

## 2019-05-08 NOTE — Telephone Encounter (Signed)
Pt. Reports she was walking her dog Tuesday and fell on concrete. Did not hit her head. Having left sided rib pain. At first it only hurt with a deep breath, but "now hurts all the time. " Pain 5/10. No bruising or open skin areas.Warm transfer to [facility] in the practice for a visit.  Answer Assessment - Initial Assessment Questions 1. MECHANISM: "How did the injury happen?"     Fall 2. ONSET: "When did the injury happen?" (Minutes or hours ago)     Tuesday 3. LOCATION: "Where on the chest is the injury located?"     Left ribs 4. APPEARANCE: "What does the injury look like?"     No bruises or swelling 5. BLEEDING: "Is there any bleeding now? If so, ask: How long has it been bleeding?"     No 6. SEVERITY: "Any difficulty with breathing?"     Hurts with deep breath 7. SIZE: For cuts, bruises, or swelling, ask: "How large is it?" (e.g., inches or centimeters)     n/a 8. PAIN: "Is there pain?" If so, ask: "How bad is the pain?"   (e.g., Scale 1-10; or mild, moderate, severe)     5 9. TETANUS: For any breaks in the skin, ask: "When was the last tetanus booster?"     Unsure 10. PREGNANCY: "Is there any chance you are pregnant?" "When was your last menstrual period?"       no  Protocols used: CHEST INJURY-A-AH

## 2019-05-08 NOTE — Telephone Encounter (Signed)
Noted. Will see pt tomorrow.

## 2019-05-09 ENCOUNTER — Other Ambulatory Visit: Payer: Self-pay

## 2019-05-09 ENCOUNTER — Encounter: Payer: Self-pay | Admitting: Family Medicine

## 2019-05-09 ENCOUNTER — Ambulatory Visit (INDEPENDENT_AMBULATORY_CARE_PROVIDER_SITE_OTHER): Payer: Medicare Other

## 2019-05-09 ENCOUNTER — Ambulatory Visit (INDEPENDENT_AMBULATORY_CARE_PROVIDER_SITE_OTHER): Payer: Medicare Other | Admitting: Family Medicine

## 2019-05-09 VITALS — BP 130/82 | HR 89 | Temp 97.8°F | Ht 63.0 in | Wt 153.0 lb

## 2019-05-09 DIAGNOSIS — R0781 Pleurodynia: Secondary | ICD-10-CM

## 2019-05-09 MED ORDER — OXYCODONE-ACETAMINOPHEN 5-325 MG PO TABS: 1.0000 | ORAL_TABLET | Freq: Three times a day (TID) | ORAL | 0 refills | Status: AC | PRN

## 2019-05-09 NOTE — Patient Instructions (Signed)
It was very nice to see you today!  I think you may have a couple of small rib fractures.  Please use the pain medication as needed for the next few days.  Your symptoms should gradually improve over the next 1 to 2 weeks.  Please make sure you are working on the breathing exercises.  Let us know if your symptoms not gradually improve over the next 2 weeks.  Take care, Dr Jerline Pain  Please try these tips to maintain a healthy lifestyle:   Eat at least 3 REAL meals and 1-2 snacks per day.  Aim for no more than 5 hours between eating.  If you eat breakfast, please do so within one hour of getting up.    Each meal should contain   half fruits/vegetables, one quarter protein, and one quarter carbs (no bigger than a computer mouse)   Cut down on sweet beverages. This includes juice, soda, and sweet tea.     Drink at least 1 glass of water with each meal and aim for at least 8 glasses per day   Exercise at least 150 minutes every week.

## 2019-05-09 NOTE — Progress Notes (Signed)
   Chief Complaint:  Crystal Patrick is a 73 y.o. female who presents today with a chief complaint of rib pain.   Assessment/Plan:  Rib Pain Plain film with what appears to be cortical defects left third and fourth rib.  Will await radiology read.  No signs of displaced fractures.  Will treat with pain control.  Recommended deep inspiration exercises as well.  Discussed reasons to return to care.  Follow-up as needed.    Subjective:  HPI:  Rib pain Started 3 days ago.  Patient was walking her dog.  Fell on concrete after her dog pulled away from her.  Landed on left side.  Now having left-sided rib pain.  Initially was only painful with deep inspiration but now is a persistent pain.  No head trauma.  No reported loss of consciousness. Tried taking tylenol and oxycodone which helped. No other obvious aggravating or alleviating factors.   ROS: Per HPI  PMH: She reports that she has never smoked. She has never used smokeless tobacco. She reports current alcohol use. She reports that she does not use drugs.      Objective:  Physical Exam: BP 130/82 (BP Location: Right Arm, Patient Position: Sitting, Cuff Size: Normal)   Pulse 89   Temp 97.8 F (36.6 C) (Temporal)   Ht 5\' 3"  (1.6 m)   Wt 153 lb (69.4 kg)   SpO2 96%   BMI 27.10 kg/m   Gen: NAD, resting comfortably CV: Regular rate and rhythm with no murmurs appreciated Pulm: Normal work of breathing, clear to auscultation bilaterally with no crackles, wheezes, or rhonchi MSK: Left anterior rib wall tender to palpation.     Algis Greenhouse. Jerline Pain, MD 05/09/2019 8:44 AM

## 2019-05-24 IMAGING — MR MR 3D RECON AT SCANNER
9 of 12 series · 39 of 48 positions shown · non-contrast
Comparison: Ultrasound 08/31/2018.  CT 11/19/2013.

CLINICAL DATA: Abdominal pain.  Suspected atypical pancreatitis.

EXAM:
MRI ABDOMEN WITHOUT CONTRAST  (INCLUDING MRCP)
TECHNIQUE: Multiplanar multisequence MR imaging of the abdomen was performed.
Heavily T2-weighted images of the biliary and pancreatic ducts were
obtained, and three-dimensional MRCP images were rendered by post
processing.

[Series 4: ax haste · axial · 6.0mm · 1.25mm/px · z∈[-138,+99]mm · 2 of 34 slices shown]
[im 1/34]
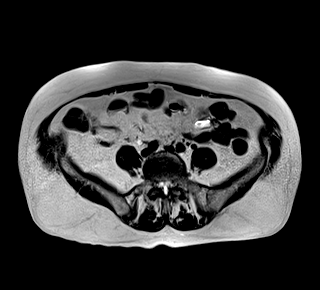
[im 34/34]
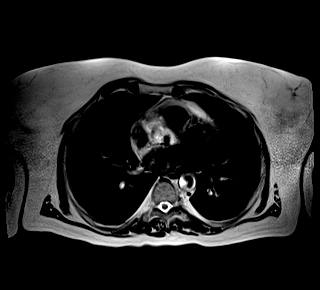

[Series 5: bSSFP · coronal · 6.0mm · 0.78mm/px · 2 of 30 slices shown]
[im 1/30]
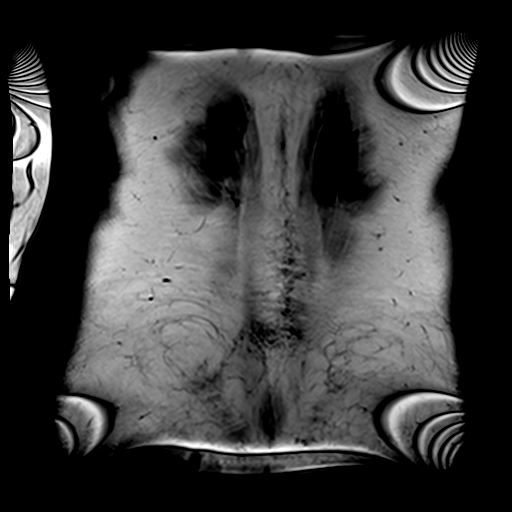
[im 30/30]
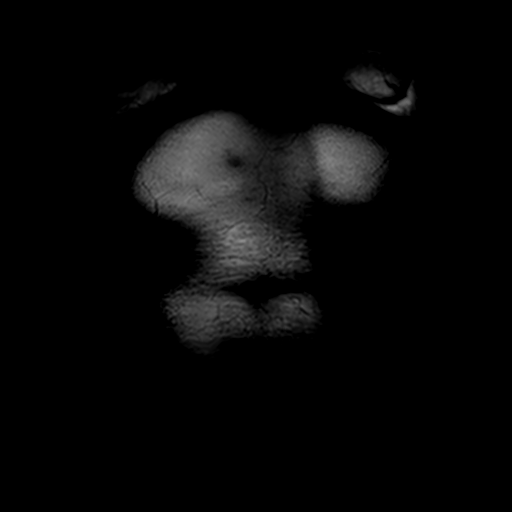

[Series 6: T2 fat-sat · axial · 6.0mm · 1.25mm/px · z∈[-145,+92]mm · 3 of 34 slices shown]
[im 1/34]
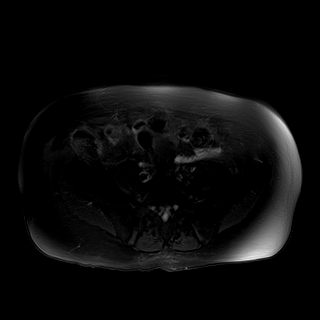
[im 17/34]
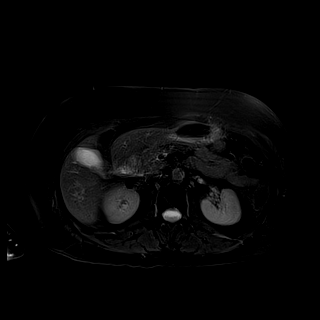
[im 34/34]
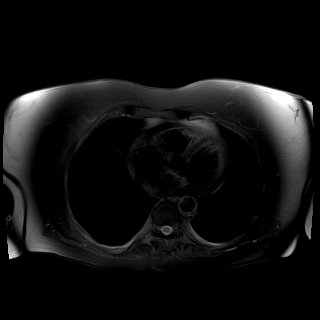

[Series 11: DWI · axial · 6.0mm · 1.49mm/px · z∈[-131,+106]mm · 8 of 102 slices shown (1 of 2)]
[im 1/102]
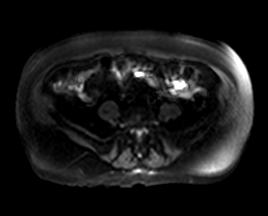
[im 15/102]
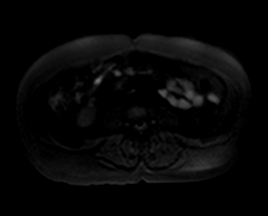
[im 29/102]
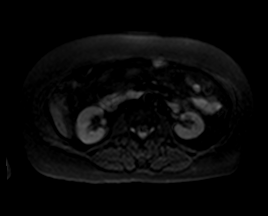
[im 44/102]
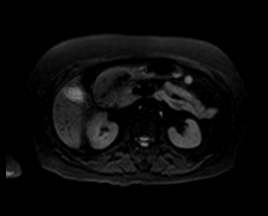
[im 58/102]
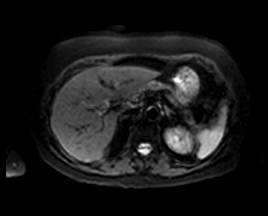
[im 73/102]
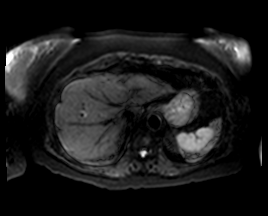
[im 87/102]
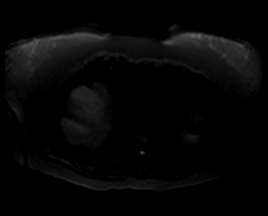
[im 102/102]
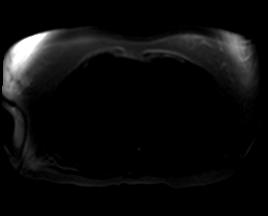

[Series 12: DWI · axial · 6.0mm · 1.49mm/px · z∈[-131,+106]mm · 3 of 34 slices shown (2 of 2)]
[im 1/34]
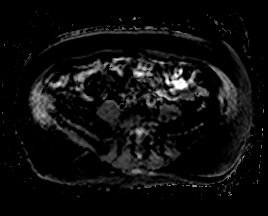
[im 17/34]
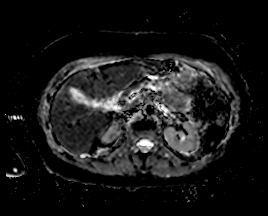
[im 34/34]
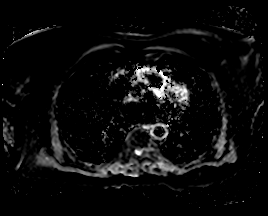

[Series 13: ax in and · axial · 3.0mm · 1.25mm/px · z∈[-123,+114]mm · 13 of 160 slices shown]
[im 1/160]
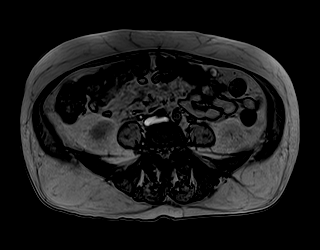
[im 14/160]
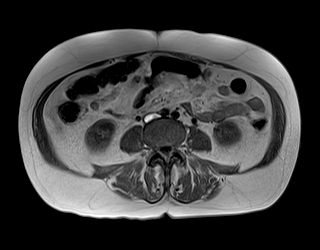
[im 27/160]
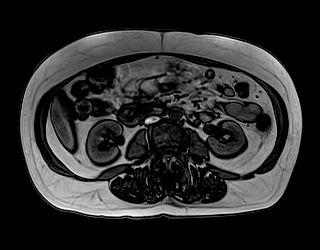
[im 40/160]
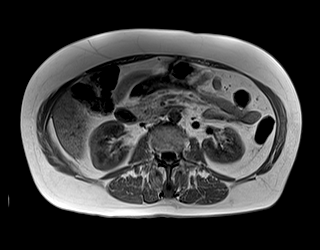
[im 54/160]
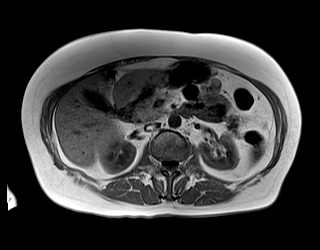
[im 67/160]
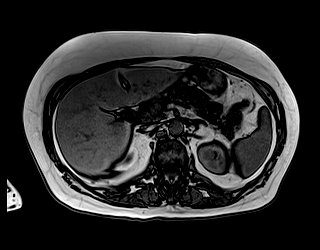
[im 80/160]
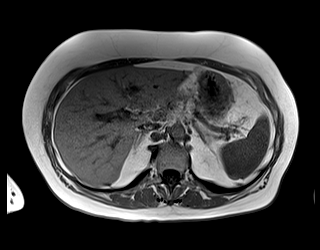
[im 93/160]
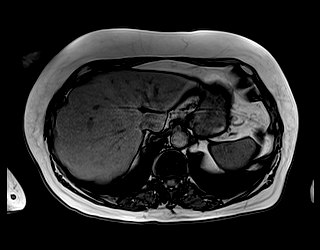
[im 107/160]
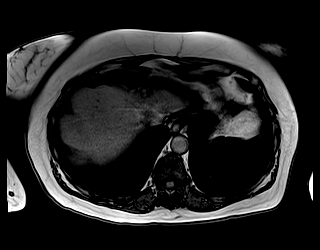
[im 120/160]
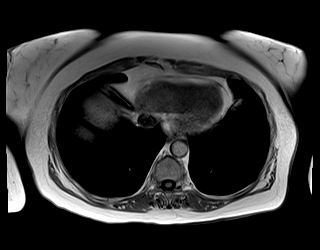
[im 133/160]
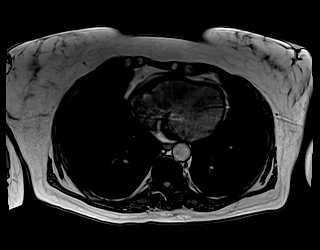
[im 146/160]
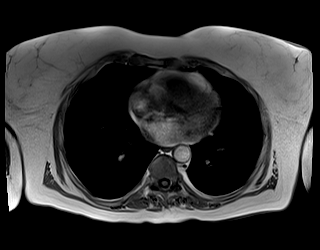
[im 160/160]
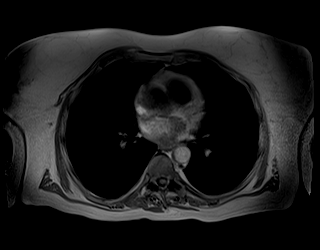

[Series 14: MRCP · coronal · 4.0mm · 1.19mm/px · 1 of 15 slices shown]
[im 1/15]
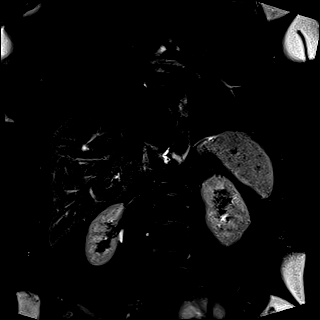

[Series 15: radials · coronal · 50.0mm · 0.78mm/px · 1 of 5 slices shown]
[im 1/5]
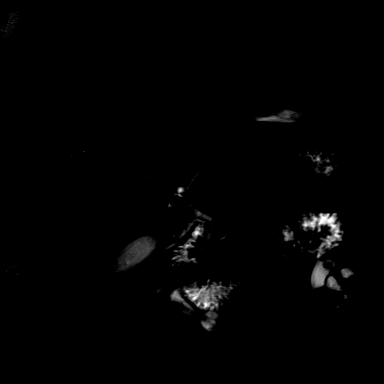

[Series 16: T1 dynamic · axial · non-contrast · 3.0mm · 1.25mm/px · z∈[-132,+105]mm · 6 of 80 slices shown]
[im 1/80]
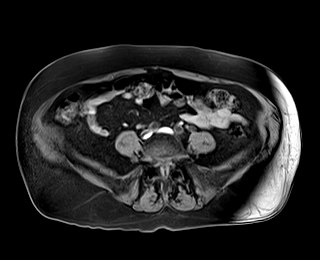
[im 16/80]
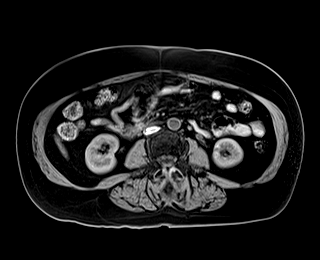
[im 32/80]
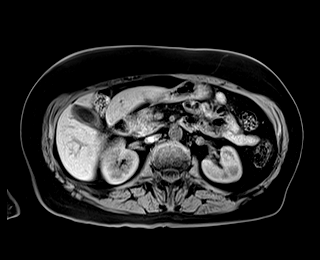
[im 48/80]
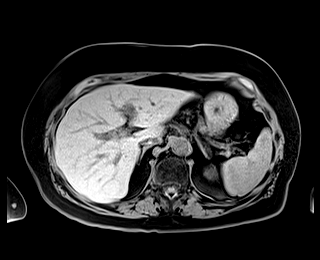
[im 64/80]
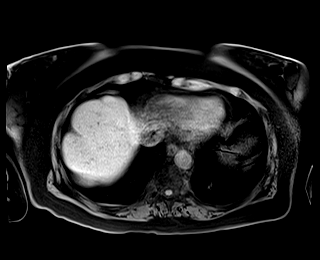
[im 80/80]
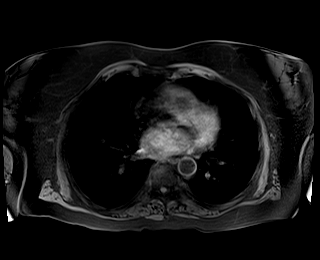

[39 of 48 positions shown; findings below may reference images not displayed]

FINDINGS: Lower chest:  The visualized lower chest appears unremarkable.

Hepatobiliary: The liver is normal in signal without significant
steatosis or focal abnormality on noncontrast imaging. As
demonstrated on earlier ultrasound, there is chronic gallbladder
wall thickening and cholelithiasis. There was no sonographic Murphy
sign on ultrasound. There is no biliary dilatation or evidence of
choledocholithiasis.

Pancreas: Unremarkable. No pancreatic ductal dilatation or
surrounding inflammatory changes. No pancreas divisum.

Spleen: Normal in size without focal abnormality.

Adrenals/Urinary Tract: Both adrenal glands appear normal. The
kidneys and ureters appear normal. No hydronephrosis.

Stomach/Bowel: No evidence of bowel wall thickening, distention or
surrounding inflammatory change.

Vascular/Lymphatic: There are no enlarged abdominal lymph nodes. No
significant vascular findings.

Other: No ascites.

Musculoskeletal: No acute or significant osseous findings. Mild
lumbar spondylosis.
IMPRESSION: 1. The pancreas appears normal.
2. Cholelithiasis and chronic gallbladder wall thickening suggesting
chronic cholecystitis as correlated with previous ultrasound. No
biliary dilatation.

## 2019-05-24 IMAGING — US ULTRASOUND ABDOMEN LIMITED
1 series · 14 of 25 positions shown · non-contrast
Comparison: Ultrasound 07/30/2018

CLINICAL DATA: Right upper quadrant epigastric pain tonight. Known
gallstones. Elevated lipase.

EXAM:
ULTRASOUND ABDOMEN LIMITED RIGHT UPPER QUADRANT

[Series 1: ultrasound abdomen limited · 14 of 71 slices shown]
[im 1/71]
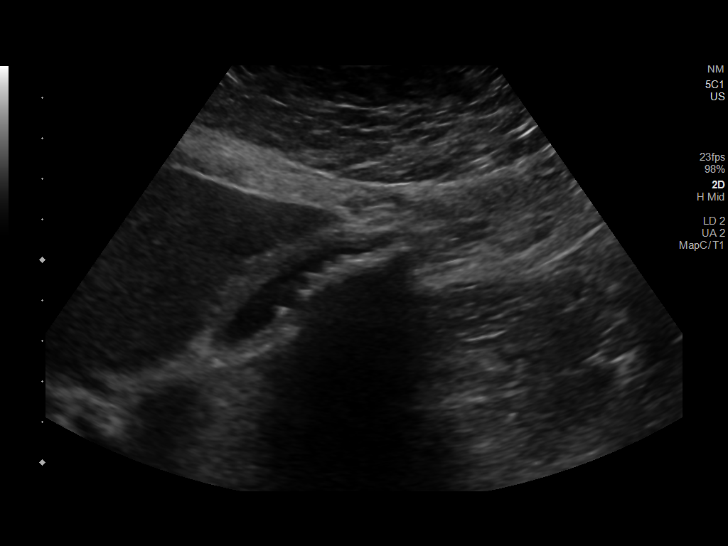
[im 6/71]
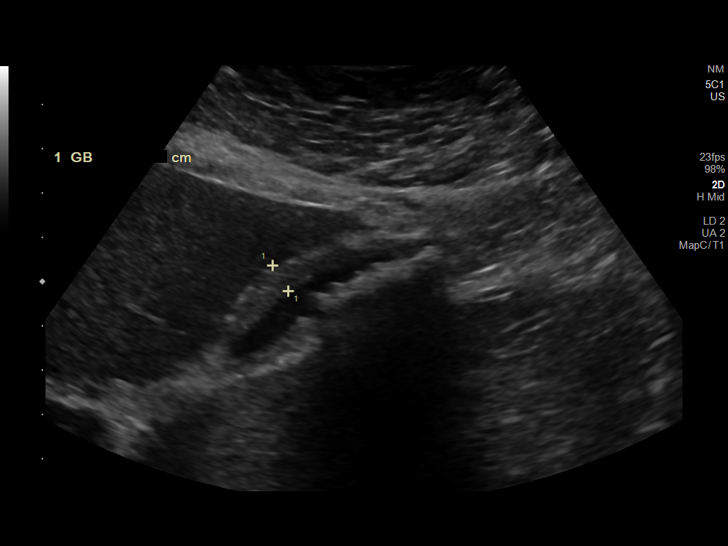
[im 12/71]
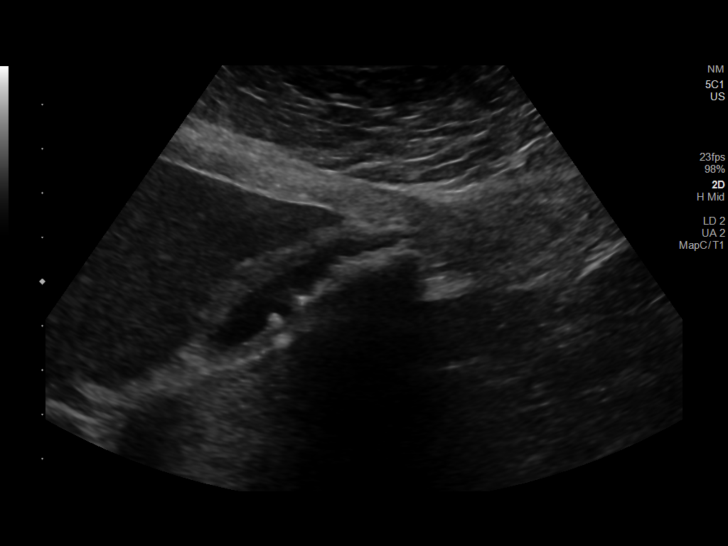
[im 18/71]
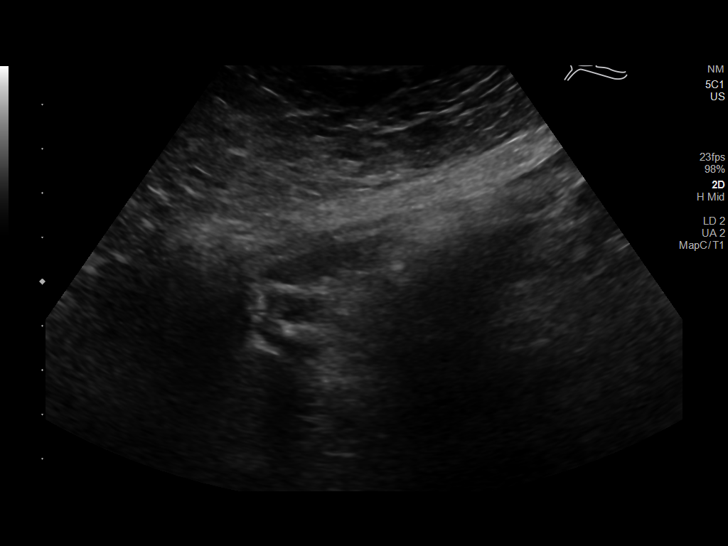
[im 24/71]
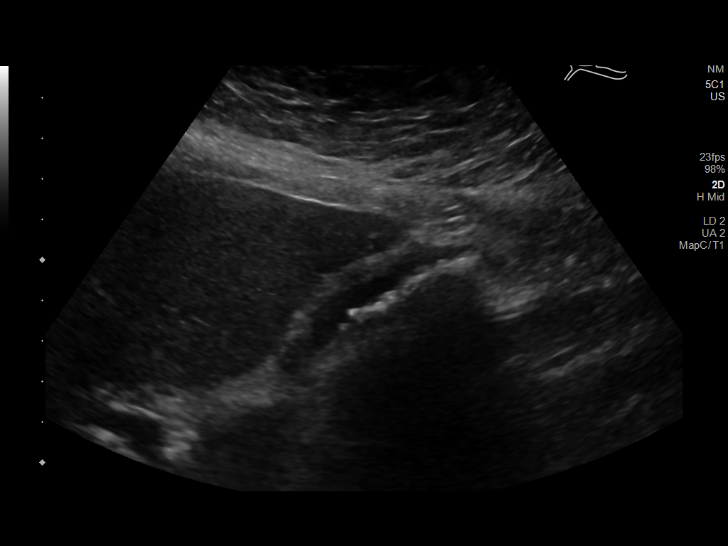
[im 27/71]
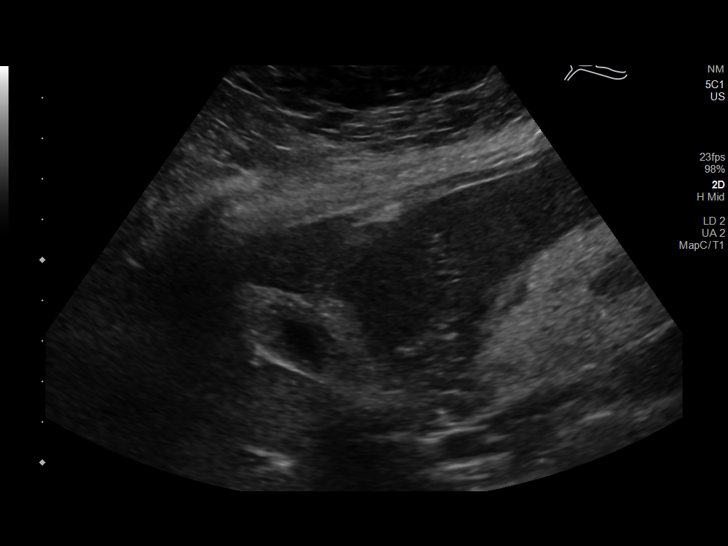
[im 33/71]
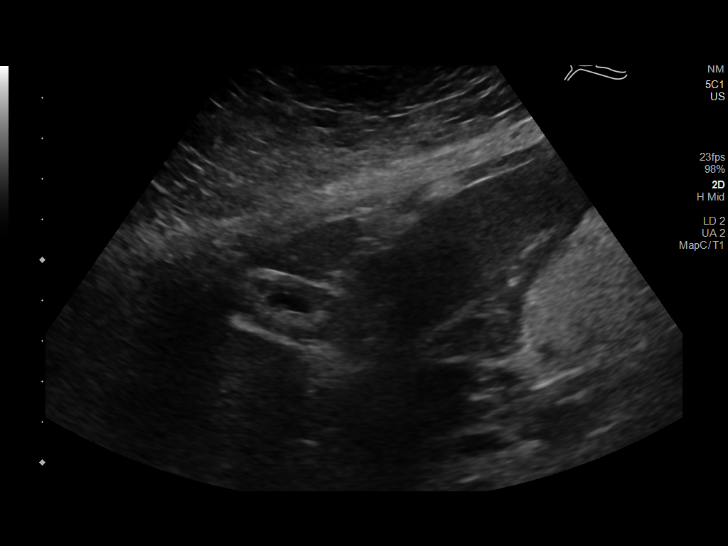
[im 38/71]
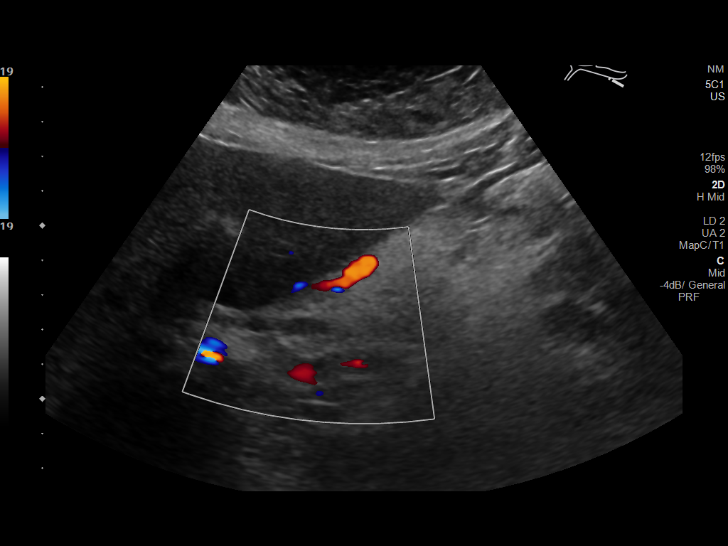
[im 44/71]
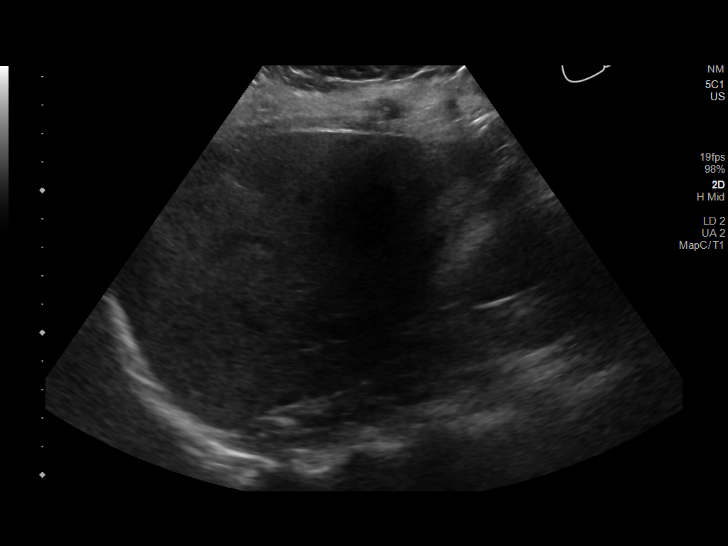
[im 47/71]
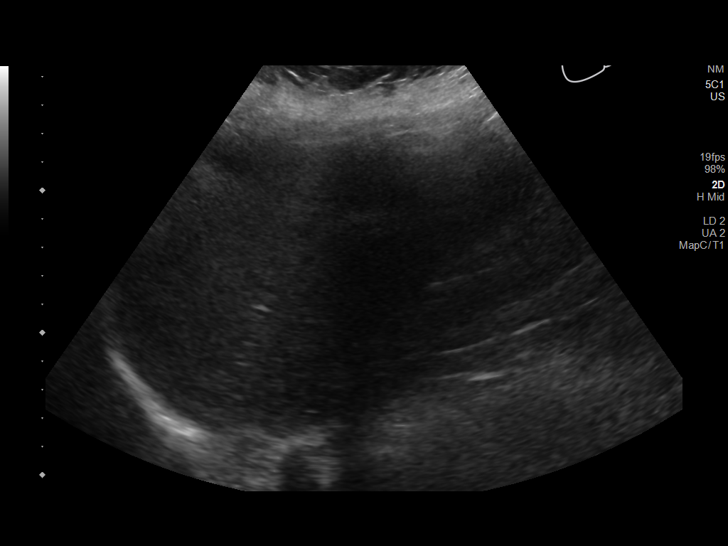
[im 53/71]
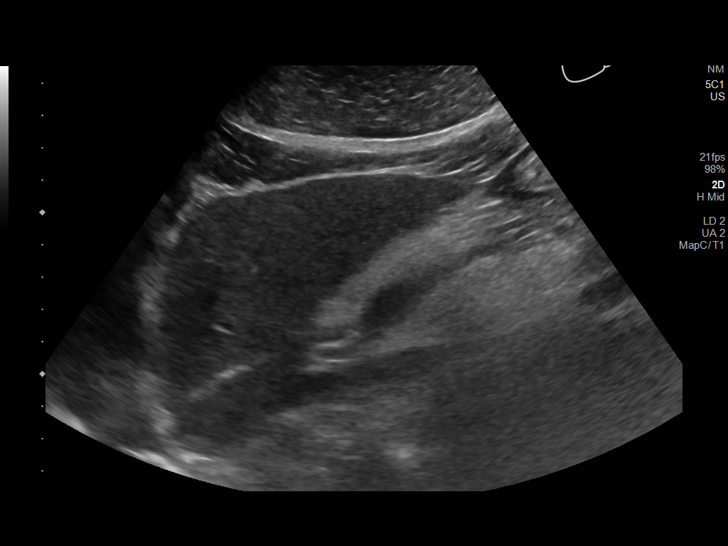
[im 59/71]
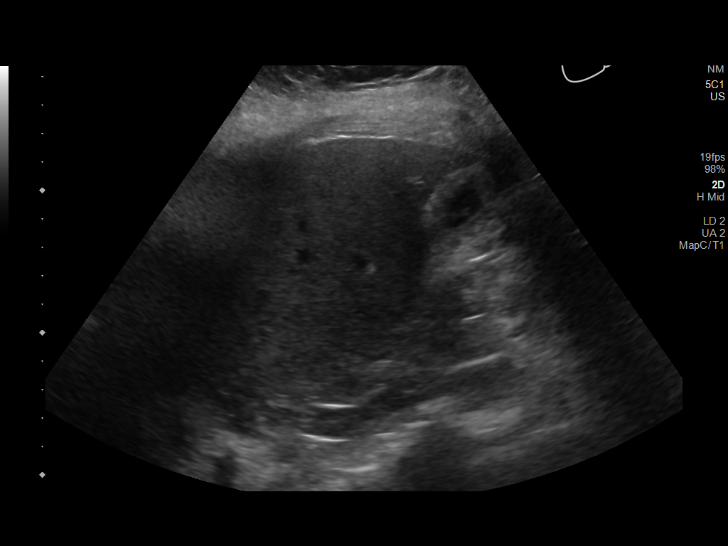
[im 65/71]
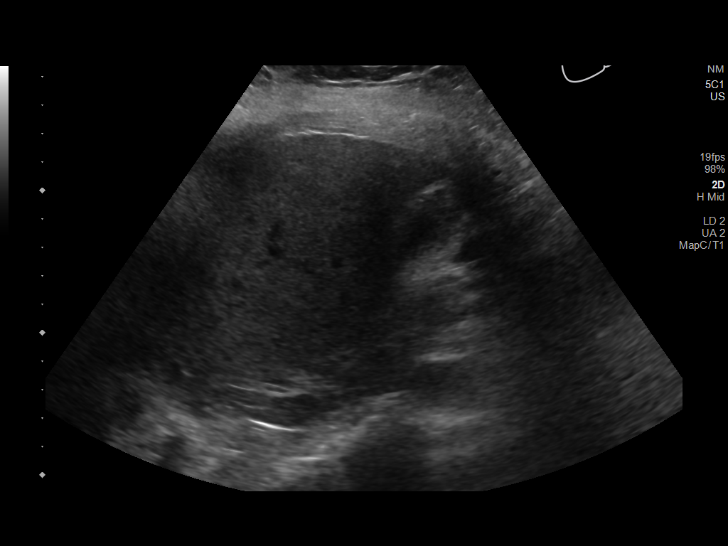
[im 71/71]
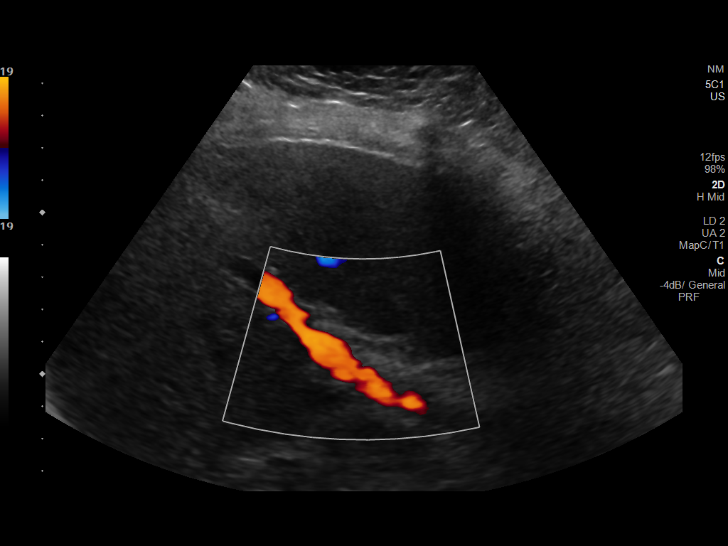

[14 of 25 positions shown; findings below may reference images not displayed]

FINDINGS: Gallbladder:

Contracted with gallstones and gallbladder wall thickening, wall
thicknesses of 6-7mm. No pericholecystic fluid. No sonographic
Murphy sign noted by sonographer.

Common bile duct:

Diameter: 4-6 mm, normal.

Liver:

No focal lesion identified. Heterogeneous and slightly increased
parenchymal echogenicity. Portal vein is patent on color Doppler
imaging with normal direction of blood flow towards the liver.
IMPRESSION: 1. Contracted gallbladder with gallstones and wall thickening,
similar in appearance to prior exam. Findings suggest chronic
cholecystitis. Negative sonographic Murphy sign biliary dilatation.
2. Mild hepatic steatosis.

## 2019-06-25 ENCOUNTER — Other Ambulatory Visit: Payer: Self-pay

## 2019-06-25 ENCOUNTER — Ambulatory Visit (INDEPENDENT_AMBULATORY_CARE_PROVIDER_SITE_OTHER): Payer: Medicare Other | Admitting: Family Medicine

## 2019-06-25 ENCOUNTER — Encounter: Payer: Self-pay | Admitting: Family Medicine

## 2019-06-25 VITALS — BP 160/80 | HR 83 | Temp 97.2°F | Ht 63.0 in | Wt 157.4 lb

## 2019-06-25 DIAGNOSIS — M542 Cervicalgia: Secondary | ICD-10-CM | POA: Diagnosis not present

## 2019-06-25 DIAGNOSIS — I10 Essential (primary) hypertension: Secondary | ICD-10-CM

## 2019-06-25 DIAGNOSIS — G4700 Insomnia, unspecified: Secondary | ICD-10-CM | POA: Insufficient documentation

## 2019-06-25 DIAGNOSIS — R011 Cardiac murmur, unspecified: Secondary | ICD-10-CM | POA: Diagnosis not present

## 2019-06-25 DIAGNOSIS — E782 Mixed hyperlipidemia: Secondary | ICD-10-CM | POA: Diagnosis not present

## 2019-06-25 LAB — COMPREHENSIVE METABOLIC PANEL
ALT: 19 U/L (ref 0–35)
AST: 20 U/L (ref 0–37)
Albumin: 4.9 g/dL (ref 3.5–5.2)
Alkaline Phosphatase: 44 U/L (ref 39–117)
BUN: 19 mg/dL (ref 6–23)
CO2: 26 mEq/L (ref 19–32)
Calcium: 9.9 mg/dL (ref 8.4–10.5)
Chloride: 103 mEq/L (ref 96–112)
Creatinine, Ser: 0.9 mg/dL (ref 0.40–1.20)
GFR: 61.56 mL/min (ref >60.00)
Glucose, Bld: 103 mg/dL — ABNORMAL HIGH (ref 70–99)
Potassium: 4.2 mEq/L (ref 3.5–5.1)
Sodium: 138 mEq/L (ref 135–145)
Total Bilirubin: 0.5 mg/dL (ref 0.2–1.2)
Total Protein: 7 g/dL (ref 6.0–8.3)

## 2019-06-25 LAB — CBC WITH DIFFERENTIAL/PLATELET
Basophils Absolute: 0 10*3/uL (ref 0.0–0.1)
Basophils Relative: 0.7 % (ref 0.0–3.0)
Eosinophils Absolute: 0.1 10*3/uL (ref 0.0–0.7)
Eosinophils Relative: 2 % (ref 0.0–5.0)
HCT: 42.8 % (ref 36.0–46.0)
Hemoglobin: 14.3 g/dL (ref 12.0–15.0)
Lymphocytes Relative: 24 % (ref 12.0–46.0)
Lymphs Abs: 1.4 10*3/uL (ref 0.7–4.0)
MCHC: 33.3 g/dL (ref 30.0–36.0)
MCV: 92.6 fl (ref 78.0–100.0)
Monocytes Absolute: 0.5 10*3/uL (ref 0.1–1.0)
Monocytes Relative: 8.7 % (ref 3.0–12.0)
Neutro Abs: 3.8 10*3/uL (ref 1.4–7.7)
Neutrophils Relative %: 64.6 % (ref 43.0–77.0)
Platelets: 355 10*3/uL (ref 150.0–400.0)
RBC: 4.63 Mil/uL (ref 3.87–5.11)
RDW: 13.4 % (ref 11.5–15.5)
WBC: 5.9 10*3/uL (ref 4.0–10.5)

## 2019-06-25 MED ORDER — VALSARTAN-HYDROCHLOROTHIAZIDE 80-12.5 MG PO TABS: 1.0000 | ORAL_TABLET | Freq: Every day | ORAL | 3 refills | Status: DC

## 2019-06-25 MED ORDER — FENOFIBRATE 160 MG PO TABS: 160.0000 mg | ORAL_TABLET | Freq: Every day | ORAL | 1 refills | Status: DC

## 2019-06-25 NOTE — Patient Instructions (Addendum)
-  they will call you with echo to set this up.   -routine lab work today and I will send in your medication.  -see you in 6 months for fasting labs. We could stop your fenofibrate and see how you do.   So nice to meet you! Dr. Rogers Blocker

## 2019-06-25 NOTE — Progress Notes (Signed)
Patient: Crystal Patrick MRN: [id number] DOB: February 27, 1946 PCP: Orma Flaming, MD     Subjective:  Chief Complaint  Patient presents with  . Establish Care  . Fall    HPI: The patient is a 74 y.o. female who presents today for establishing care. She has a history of HTN, GERD, overactive bladder, hyperlipidemia, MDD and insomnia managed by psychiatry and osteoporosis.   Hypertension: Here for follow up of hypertension.  Currently on valsartan-hctz at 80-12.5mg . A previous provider tried to increase this to 160-12.5mg , but she could not tolerate and was dizzy/light headed. Takes medication as prescribed and denies any side effects. Exercise includes walking. Weight has been stable. Denies any chest pain, headaches, shortness of breath, vision changes, swelling in lower extremities. Blood pressure has been good at past office visits, but she states it is high today as she doesn't know me and is nervous.   She fell about 1.5 weeks ago and tripped over her dog and strained her neck. She feels like this is more muscle related. Has full range of motion and tender on side of her neck, not bones. No vision changes or headaches.   She has been a little dizzy and at times catching her breath. No leg swelling or coughing. No chest pain or palpitations. She said her gait feels off, but denies any focal deficits or radicular symptoms.   Review of Systems  Constitutional: Positive for fatigue. Negative for appetite change and chills.  HENT: Negative for sinus pressure, sneezing and sore throat.   Eyes: Negative for pain, redness and itching.  Respiratory: Negative for cough, shortness of breath and wheezing.   Cardiovascular: Negative for chest pain, palpitations and leg swelling.  Gastrointestinal: Negative for abdominal pain, nausea and vomiting.  Endocrine: Negative for cold intolerance, heat intolerance and polydipsia.  Genitourinary: Negative for pelvic pain, vaginal bleeding and vaginal pain.   Musculoskeletal: Positive for joint swelling and neck pain. Negative for back pain.  Skin: Negative for pallor, rash and wound.  Allergic/Immunologic: Negative for environmental allergies, food allergies and immunocompromised state.  Neurological: Positive for dizziness. Negative for light-headedness and headaches.  Psychiatric/Behavioral: Negative for behavioral problems and suicidal ideas. The patient is not nervous/anxious.        Pt is being treated by Dr. [name].    Allergies Patient is allergic to other and statins.  Past Medical History Patient  has a past medical history of Anxiety and depression, Arthritis, Chicken pox, Gallstone pancreatitis (08/31/2018), GERD (gastroesophageal reflux disease), Hiatal hernia, osteoporosis (03/17/2016), Hyperlipidemia, Hypertension, Liver failure ([place]), Overactive bladder (06/02/2013), Peptic ulcer, Seasonal allergies, Squamous cell carcinoma, and Status post dilation of esophageal narrowing.  Surgical History Patient  has a past surgical history that includes Tonsillectomy and adenoidectomy; Bunionectomy (Bilateral); Nasal/sinus endoscopy; Appendectomy (1964); and Cholecystectomy (N/A, 09/01/2018).  Family History Pateint's family history includes Bladder Cancer in her maternal uncle; Dementia in her mother; Diabetes in her daughter; Heart disease in her father; Ovarian cancer in her paternal aunt; Prostate cancer in her brother and maternal uncle; Throat cancer in her father; Thyroid cancer in her sister.  Social History Patient  reports that she has never smoked. She has never used smokeless tobacco. She reports current alcohol use. She reports that she does not use drugs.    Objective: Vitals:   06/25/19 1042 06/25/19 1109  BP: (!) 160/98 (!) 160/80  Pulse: 83   Temp: (!) 97.2 F (36.2 C)   SpO2: 96%   Weight: 157 lb 6.4 oz (71.4  kg)   Height: 5\' 3"  (1.6 m)     Body mass index is 27.88 kg/m.  Physical Exam Vitals reviewed.   Constitutional:      Appearance: Normal appearance.  HENT:     Head: Normocephalic and atraumatic.     Right Ear: Tympanic membrane, ear canal and external ear normal.     Left Ear: Tympanic membrane, ear canal and external ear normal.     Mouth/Throat:     Mouth: Mucous membranes are moist.  Eyes:     Extraocular Movements: Extraocular movements intact.     Pupils: Pupils are equal, round, and reactive to light.  Cardiovascular:     Rate and Rhythm: Normal rate and regular rhythm.     Heart sounds: Murmur (harsh systolic at right sternal border) present.  Pulmonary:     Effort: Pulmonary effort is normal.     Breath sounds: Normal breath sounds.  Abdominal:     General: Bowel sounds are normal.     Palpations: Abdomen is soft.  Musculoskeletal:     Cervical back: Normal range of motion and neck supple. No rigidity or tenderness.  Lymphadenopathy:     Cervical: No cervical adenopathy.  Neurological:     General: No focal deficit present.     Mental Status: She is alert and oriented to person, place, and time.     Cranial Nerves: No cranial nerve deficit.     Motor: No weakness.     Gait: Gait normal.  Psychiatric:        Mood and Affect: Mood normal.        Behavior: Behavior normal.        Assessment/plan: 1. Essential hypertension Above goal, but she states she is nervous and looking back her readings have been normal. Refilled her current medication at decreased dosage and asked that she monitor at home. Will see her back for routine follow up in 6 months. Regular labs today and fasting labs in  6 months time. Let me know sooner if bp stays >150/90.  - CBC with Differential/Platelet - Comprehensive metabolic panel - Microalbumin / creatinine urine ratio; Future  2. Neck pain, acute Appears to muscle related. She has full ROM and sore along muscle. Declines any medication. Recommended heating pad, otc pain medication and trial volsartan gel. stretching as tolerated.  If not getting better she is to let me know.   3. Systolic murmur Quite harsh murmur and with dizziness/off gait concern for aortic stenosis. Checking echo, labs today. If normal and symptoms continue will have her come back for further work up for vertigo vs. Other central pathology. Precautions given.  - ECHOCARDIOGRAM COMPLETE; Future  4. Mixed hyperlipidemia Trial off fenofibrate. Discussed I don't normally treat TG that are significantly elevated will do trial off x 6 months and recheck at f/u. Can not tolerate statins.  - fenofibrate 160 MG tablet; Take 1 tablet (160 mg total) by mouth daily.  Dispense: 90 tablet; Refill: 1   Total time of encounter: 35 minutes total time of encounter, including 25 minutes spent in face-to-face patient care. This time includes coordination of care and counseling regarding chronic medical conditions and new diagnosis of harsh systolic murmur with symptoms as well as acute complaints. Remainder of non-face-to-face time involved reviewing chart documents/testing relevant to the patient encounter and documentation in the medical record.  This visit occurred during the SARS-CoV-2 public health emergency.  Safety protocols were in place, including screening questions prior to the visit,  additional usage of staff PPE, and extensive cleaning of exam room while observing appropriate contact time as indicated for disinfecting solutions.    Return in about 6 months (around 12/23/2019) for fasting labs/lipids .   Orma Flaming, MD [place]   06/25/2019

## 2019-07-01 ENCOUNTER — Telehealth: Payer: Self-pay | Admitting: Family Medicine

## 2019-07-01 LAB — MICROALBUMIN / CREATININE URINE RATIO
Creatinine,U: 40 mg/dL
Microalb Creat Ratio: 1.8 mg/g (ref 0.0–30.0)
Microalb, Ur: <0.7 mg/dL (ref 0.0–1.9)

## 2019-07-01 NOTE — Telephone Encounter (Signed)
Pt called stating Dr. Rogers Blocker diagnosed her with having a heart murmur last week. Pt has a dentist appt tomorrow and was told by dentist to ask if pt needs medication before her appointment. Please advise.

## 2019-07-01 NOTE — Telephone Encounter (Signed)
Please advise 

## 2019-07-01 NOTE — Addendum Note (Signed)
Addended by: Francis Dowse T on: 07/01/2019 01:32 PM   Modules accepted: Orders

## 2019-07-02 NOTE — Telephone Encounter (Signed)
I don't think so. She should be fine. We need echo to determine cause of murmur.  Orma Flaming, MD [place]

## 2019-07-09 ENCOUNTER — Ambulatory Visit (HOSPITAL_COMMUNITY): Payer: Medicare Other | Attending: Cardiology

## 2019-07-09 ENCOUNTER — Other Ambulatory Visit: Payer: Self-pay

## 2019-07-09 DIAGNOSIS — R011 Cardiac murmur, unspecified: Secondary | ICD-10-CM | POA: Insufficient documentation

## 2019-07-09 NOTE — Telephone Encounter (Signed)
Called to speak with the patient to see if Echo was done. Echo was done this morning.

## 2019-07-11 ENCOUNTER — Telehealth: Payer: Self-pay

## 2019-07-11 NOTE — Telephone Encounter (Signed)
Patient calling in to see if the results have came back from her echo.

## 2019-07-11 NOTE — Telephone Encounter (Signed)
Patient calling to find out test results. Informed patient that she could used my chart .Patient states that she haven't used mychart in years and she hate it. Please return patient call.

## 2019-07-11 NOTE — Telephone Encounter (Signed)
Returned Patient's call to give her Echo result message from Dr Rogers Blocker. Pt was relieved and voiced understanding.

## 2019-07-14 NOTE — Telephone Encounter (Signed)
CMA returned call. Documented on separate note.  Orma Flaming, MD [place]

## 2019-07-16 DIAGNOSIS — Z23 Encounter for immunization: Secondary | ICD-10-CM | POA: Diagnosis not present

## 2019-09-04 DIAGNOSIS — F331 Major depressive disorder, recurrent, moderate: Secondary | ICD-10-CM | POA: Diagnosis not present

## 2019-09-09 DIAGNOSIS — Z85828 Personal history of other malignant neoplasm of skin: Secondary | ICD-10-CM | POA: Diagnosis not present

## 2019-09-09 DIAGNOSIS — D1801 Hemangioma of skin and subcutaneous tissue: Secondary | ICD-10-CM | POA: Diagnosis not present

## 2019-09-09 DIAGNOSIS — L905 Scar conditions and fibrosis of skin: Secondary | ICD-10-CM | POA: Diagnosis not present

## 2019-09-09 DIAGNOSIS — C4442 Squamous cell carcinoma of skin of scalp and neck: Secondary | ICD-10-CM | POA: Diagnosis not present

## 2019-09-09 DIAGNOSIS — D485 Neoplasm of uncertain behavior of skin: Secondary | ICD-10-CM | POA: Diagnosis not present

## 2019-09-09 DIAGNOSIS — D225 Melanocytic nevi of trunk: Secondary | ICD-10-CM | POA: Diagnosis not present

## 2019-09-09 DIAGNOSIS — L570 Actinic keratosis: Secondary | ICD-10-CM | POA: Diagnosis not present

## 2019-09-09 DIAGNOSIS — L821 Other seborrheic keratosis: Secondary | ICD-10-CM | POA: Diagnosis not present

## 2019-10-09 DIAGNOSIS — L905 Scar conditions and fibrosis of skin: Secondary | ICD-10-CM | POA: Diagnosis not present

## 2019-10-09 DIAGNOSIS — D044 Carcinoma in situ of skin of scalp and neck: Secondary | ICD-10-CM | POA: Diagnosis not present

## 2019-10-17 NOTE — Progress Notes (Signed)
Crystal Patrick is a 74 y.o. female here for a new problem.  I acted as a Education administrator for [employer], PA-C Crystal Pickler, LPN   History of Present Illness:   Chief Complaint  Patient presents with  . Shoulder Pain    HPI   Shoulder pain Pt c/o bilateral shoulder pain. Right shoulder has been bothering her x 1 month, constant dull achy pain. Left shoulder has been off and on. Pt has not needed any medication. Feels like her symptoms are related to stress. States that all of her pain resolved as soon as she made her office visit for today. Denies any trauma, chest pain, abdominal pain, SOB, n/v.  The main reason she brings this shoulder pain up is because she has history of liver failure that started with myalgias (secondary to lipitor) and she is always fearful of return of this.  HTN Currently taking Valsartan-HCT 160-25 mg (recently doubled her home script of Valsartan-HCT 80-12.5mg  a week ago.) At home blood pressure readings are: >150/90. Patient denies chest pain, SOB, blurred vision, dizziness, unusual headaches, lower leg swelling. Patient is compliant with medication. Denies excessive caffeine intake, stimulant usage, excessive alcohol intake, or increase in salt consumption.  BP Readings from Last 3 Encounters:  10/21/19 (!) 146/90  06/25/19 (!) 160/80  05/09/19 130/82   MDD Currently sees Dr. Sabas Patrick for this. Denies current SI/HI. Husband suffers from alcoholism and this is her main source of anxiety/depression. Getting out to her church now that she is fully vaccinated has helped.  Past Medical History:  Diagnosis Date  . Anxiety and depression    sees psychiatrist for this  . Arthritis   . Chicken pox   . Gallstone pancreatitis 08/31/2018  . GERD (gastroesophageal reflux disease)    with hiatal hernia, esophageal stricture  . Hiatal hernia   . Hx of osteoporosis 03/17/2016   -did not tol bisphosphonates -did injections -does not wish to re-assess or repeat DEXA  -doing walking and Vit D  . Hyperlipidemia    has refused treatment or further evaluation other then lifestyle changes  . Hypertension   . Liver failure ([place])    due to [place]  . Overactive bladder 06/02/2013  . Peptic ulcer   . Seasonal allergies   . Squamous cell carcinoma   . Status post dilation of esophageal narrowing      Social History   Tobacco Use  . Smoking status: Never Smoker  . Smokeless tobacco: Never Used  Substance Use Topics  . Alcohol use: Yes    Alcohol/week: 0.0 standard drinks    Comment: one drink a day  . Drug use: No    Past Surgical History:  Procedure Laterality Date  . APPENDECTOMY  1964  . BUNIONECTOMY Bilateral     1 foot x 2,   . CHOLECYSTECTOMY N/A 09/01/2018   Procedure: LAPAROSCOPIC CHOLECYSTECTOMY WITH INTRAOPERATIVE CHOLANGIOGRAM;  Surgeon: Ileana Roup, MD;  Location: [facility];  Service: General;  Laterality: N/A;  . NASAL/SINUS ENDOSCOPY     x 3  . TONSILLECTOMY AND ADENOIDECTOMY      Family History  Problem Relation Age of Onset  . Throat cancer Father   . Heart disease Father   . Thyroid cancer Sister   . Ovarian cancer Paternal Aunt   . Prostate cancer Brother   . Prostate cancer Maternal Uncle        x 2  . Bladder Cancer Maternal Uncle   . Diabetes Daughter   .  Dementia Mother     Allergies  Allergen Reactions  . Other     Hormone replacement therapy-per patient she cannot recall reaction  . Statins     Liver failure    Current Medications:   Current Outpatient Medications:  .  buPROPion (WELLBUTRIN XL) 150 MG 24 hr tablet, Take 150 mg by mouth daily. , Disp: , Rfl:  .  busPIRone (BUSPAR) 15 MG tablet, Take 15 mg by mouth. Take 30 mg in AM and 45 in the PM, Disp: , Rfl:  .  Cholecalciferol (VITAMIN D3) 125 MCG (5000 UT) TABS, Take 5,000 Units by mouth daily. , Disp: , Rfl:  .  esomeprazole (NEXIUM) 20 MG capsule, Take 40 mg by mouth at bedtime. , Disp: , Rfl:  .  FLUoxetine (PROZAC) 40 MG capsule, Take 40  mg by mouth daily. , Disp: , Rfl:  .  magnesium gluconate (MAGONATE) 500 MG tablet, Take 500 mg by mouth daily., Disp: , Rfl:  .  potassium gluconate 595 (99 K) MG TABS tablet, Take 99 mg by mouth daily. , Disp: , Rfl:  .  temazepam (RESTORIL) 15 MG capsule, Take 30 mg by mouth at bedtime. , Disp: , Rfl:  .  tolterodine (DETROL LA) 4 MG 24 hr capsule, Take 1 capsule (4 mg total) by mouth daily., Disp: 90 capsule, Rfl: 2 .  Turmeric (QC TUMERIC COMPLEX PO), Take by mouth., Disp: , Rfl:  .  VITAMIN C, CALCIUM ASCORBATE, PO, Take by mouth., Disp: , Rfl:  .  vitamin E 400 UNIT capsule, Take 400 Units by mouth daily. , Disp: , Rfl:  .  valsartan-hydrochlorothiazide (DIOVAN HCT) 160-25 MG tablet, Take 1 tablet by mouth daily., Disp: 30 tablet, Rfl: 1   Review of Systems:   ROS  Negative unless otherwise specified per HPI.   Vitals:   Vitals:   10/21/19 1303  BP: (!) 146/90  Pulse: 85  Temp: 97.9 F (36.6 C)  TempSrc: Temporal  SpO2: 94%  Weight: 157 lb 6.1 oz (71.4 kg)  Height: 5\' 3"  (1.6 m)     Body mass index is 27.88 kg/m.  Physical Exam:   Physical Exam Vitals and nursing note reviewed.  Constitutional:      General: She is not in acute distress.    Appearance: She is well-developed. She is not ill-appearing or toxic-appearing.  Cardiovascular:     Rate and Rhythm: Normal rate and regular rhythm.     Pulses: Normal pulses.     Heart sounds: Normal heart sounds, S1 normal and S2 normal.     Comments: No LE edema Pulmonary:     Effort: Pulmonary effort is normal.     Breath sounds: Normal breath sounds.  Musculoskeletal:     Comments: FROM b/l shoulders No TTP or weakness appreciated  Skin:    General: Skin is warm and dry.  Neurological:     Mental Status: She is alert.     GCS: GCS eye subscore is 4. GCS verbal subscore is 5. GCS motor subscore is 6.  Psychiatric:        Speech: Speech normal.        Behavior: Behavior normal. Behavior is cooperative.       Assessment and Plan:   [name] was seen today for shoulder pain.  Diagnoses and all orders for this visit:  Essential hypertension Uncontrolled. I have sent in the increase of her medication of Diovan HCT 160-25 for her to take. I have asked her  to follow-up with Dr. Rogers Blocker in 1 month, sooner if concerns, to follow-up on BP control. -     Comprehensive metabolic panel  Acute pain of both shoulders Denies any current symptoms or need for work-up, however I will obtain CMP for LFTs for reassurance for patient.  Recurrent major depressive disorder, in partial remission ([place]) Denies SI/HI. Management per psych. Provided emotional support.  Other orders -     valsartan-hydrochlorothiazide (DIOVAN HCT) 160-25 MG tablet; Take 1 tablet by mouth daily.  . Reviewed expectations re: course of current medical issues. . Discussed self-management of symptoms. . Outlined signs and symptoms indicating need for more acute intervention. . Patient verbalized understanding and all questions were answered. . See orders for this visit as documented in the electronic medical record. . Patient received an After-Visit Summary.  CMA or LPN served as scribe during this visit. History, Physical, and Plan performed by medical provider. The above documentation has been reviewed and is accurate and complete.  Inda Coke, PA-C

## 2019-10-21 ENCOUNTER — Other Ambulatory Visit: Payer: Self-pay

## 2019-10-21 ENCOUNTER — Ambulatory Visit (INDEPENDENT_AMBULATORY_CARE_PROVIDER_SITE_OTHER): Payer: Medicare Other | Admitting: Physician Assistant

## 2019-10-21 ENCOUNTER — Encounter: Payer: Self-pay | Admitting: Physician Assistant

## 2019-10-21 VITALS — BP 146/90 | HR 85 | Temp 97.9°F | Ht 63.0 in | Wt 157.4 lb

## 2019-10-21 DIAGNOSIS — M25512 Pain in left shoulder: Secondary | ICD-10-CM | POA: Diagnosis not present

## 2019-10-21 DIAGNOSIS — F3341 Major depressive disorder, recurrent, in partial remission: Secondary | ICD-10-CM | POA: Diagnosis not present

## 2019-10-21 DIAGNOSIS — I10 Essential (primary) hypertension: Secondary | ICD-10-CM

## 2019-10-21 DIAGNOSIS — M25511 Pain in right shoulder: Secondary | ICD-10-CM | POA: Diagnosis not present

## 2019-10-21 LAB — COMPREHENSIVE METABOLIC PANEL
ALT: 19 U/L (ref 0–35)
AST: 20 U/L (ref 0–37)
Albumin: 4.7 g/dL (ref 3.5–5.2)
Alkaline Phosphatase: 52 U/L (ref 39–117)
BUN: 28 mg/dL — ABNORMAL HIGH (ref 6–23)
CO2: 26 mEq/L (ref 19–32)
Calcium: 9.6 mg/dL (ref 8.4–10.5)
Chloride: 98 mEq/L (ref 96–112)
Creatinine, Ser: 0.74 mg/dL (ref 0.40–1.20)
GFR: 77.09 mL/min (ref >60.00)
Glucose, Bld: 116 mg/dL — ABNORMAL HIGH (ref 70–99)
Potassium: 3.7 mEq/L (ref 3.5–5.1)
Sodium: 138 mEq/L (ref 135–145)
Total Bilirubin: 0.6 mg/dL (ref 0.2–1.2)
Total Protein: 7 g/dL (ref 6.0–8.3)

## 2019-10-21 MED ORDER — VALSARTAN-HYDROCHLOROTHIAZIDE 160-25 MG PO TABS
1.0000 | ORAL_TABLET | Freq: Every day | ORAL | 1 refills | Status: DC
Start: 2019-10-21 — End: 2019-11-20

## 2019-10-21 NOTE — Patient Instructions (Signed)
It was great to see you!  I will send you a mychart message with your lab result.  I have sent in the increased dosage of your medication. Please follow-up with Dr. Rogers Blocker in a month to make sure your blood pressure is improving.  Take care,  Inda Coke PA-C

## 2019-11-20 ENCOUNTER — Ambulatory Visit (INDEPENDENT_AMBULATORY_CARE_PROVIDER_SITE_OTHER): Payer: Medicare Other | Admitting: Family Medicine

## 2019-11-20 ENCOUNTER — Other Ambulatory Visit: Payer: Self-pay

## 2019-11-20 ENCOUNTER — Encounter: Payer: Self-pay | Admitting: Family Medicine

## 2019-11-20 VITALS — BP 160/84 | HR 84 | Temp 98.3°F | Ht 63.0 in | Wt 154.0 lb

## 2019-11-20 DIAGNOSIS — I10 Essential (primary) hypertension: Secondary | ICD-10-CM

## 2019-11-20 MED ORDER — AMLODIPINE BESYLATE 5 MG PO TABS: 5.0000 mg | ORAL_TABLET | Freq: Every day | ORAL | 3 refills | Status: DC

## 2019-11-20 MED ORDER — VALSARTAN-HYDROCHLOROTHIAZIDE 160-25 MG PO TABS: 1.0000 | ORAL_TABLET | Freq: Every day | ORAL | 1 refills | Status: DC

## 2019-11-20 NOTE — Progress Notes (Signed)
Patient: Crystal Patrick MRN: [id number] DOB: Oct 08, 1945 PCP: Orma Flaming, MD     Subjective:  Chief Complaint  Patient presents with  . Hypertension    HPI: The patient is a 74 y.o. female who presents today for follow up of HTN. She saw the PA a month ago and noticed to have blood pressure to 146/90 and had her medication increased to 160-25mg . She was on 80-12.5mg . No problems with the increase in her medication. She states her average at home is 145/88. Her husband is an alcoholic and this is a big stressor for her. She is asymptomatic. No issues with increase in medication. Requesting refill of this.    Review of Systems  Constitutional: Negative for fatigue and fever.  Respiratory: Negative for cough and shortness of breath.   Cardiovascular: Negative for chest pain, palpitations and leg swelling.  Gastrointestinal: Negative for abdominal pain, diarrhea, nausea and vomiting.  Neurological: Negative for dizziness.    Allergies Patient is allergic to other and statins.  Past Medical History Patient  has a past medical history of Anxiety and depression, Arthritis, Chicken pox, Gallstone pancreatitis (08/31/2018), GERD (gastroesophageal reflux disease), Hiatal hernia, osteoporosis (03/17/2016), Hyperlipidemia, Hypertension, Liver failure ([place]), Overactive bladder (06/02/2013), Peptic ulcer, Seasonal allergies, Squamous cell carcinoma, and Status post dilation of esophageal narrowing.  Surgical History Patient  has a past surgical history that includes Tonsillectomy and adenoidectomy; Bunionectomy (Bilateral); Nasal/sinus endoscopy; Appendectomy (1964); and Cholecystectomy (N/A, 09/01/2018).  Family History Pateint's family history includes Bladder Cancer in her maternal uncle; Dementia in her mother; Diabetes in her daughter; Heart disease in her father; Ovarian cancer in her paternal aunt; Prostate cancer in her brother and maternal uncle; Throat cancer in her father; Thyroid  cancer in her sister.  Social History Patient  reports that she has never smoked. She has never used smokeless tobacco. She reports current alcohol use. She reports that she does not use drugs.    Objective: Vitals:   11/20/19 1324 11/20/19 1342  BP: (!) 160/88 (!) 160/84  Pulse: 84   Temp: 98.3 F (36.8 C)   TempSrc: Temporal   SpO2: 97%   Weight: 154 lb (69.9 kg)   Height: 5\' 3"  (1.6 m)     Body mass index is 27.28 kg/m.  Physical Exam Vitals reviewed.  Constitutional:      Appearance: Normal appearance.  HENT:     Head: Normocephalic and atraumatic.  Cardiovascular:     Rate and Rhythm: Normal rate and regular rhythm.     Heart sounds: Murmur heard.   Pulmonary:     Effort: Pulmonary effort is normal.     Breath sounds: Normal breath sounds.  Abdominal:     General: Abdomen is flat. Bowel sounds are normal.     Palpations: Abdomen is soft.  Musculoskeletal:     Cervical back: Normal range of motion and neck supple.  Neurological:     General: No focal deficit present.     Mental Status: She is alert and oriented to person, place, and time.  Psychiatric:        Mood and Affect: Mood normal.        Behavior: Behavior normal.        Assessment/plan: 1. Essential hypertension Home readings much closer to goal for her age, but continues to be elevated in office. Also recommended she take pressures from her left arm instead of right arm. Continue higher dose of diovan-hct. She has a lot of stressors at home as well.  Refills sent in and im starting her on low dose norvasc 5mg  daily. See her back in august for routine HTN follow up with labs. Side effects of norvasc discussed.    This visit occurred during the SARS-CoV-2 public health emergency.  Safety protocols were in place, including screening questions prior to the visit, additional usage of staff PPE, and extensive cleaning of exam room while observing appropriate contact time as indicated for disinfecting  solutions.     Return in about 5 weeks (around 12/25/2019) for htn follow up/labs .     Orma Flaming, MD [place]  11/20/2019

## 2019-11-20 NOTE — Patient Instructions (Signed)
1) blood pressure still above goal. Continue current dose of your diovan (sentin refill ) and adding on new drug called amlodipine 5mg . You will take this once/day.   2) see you back for routine f/u with labs in august.   3) take blood pressure in left arm. email me or call if >150/90.   Good to see you!  Dr. Rogers Blocker

## 2019-11-20 NOTE — Progress Notes (Signed)
Patient: Crystal Patrick MRN: [id number] DOB: 1945-12-22 PCP: Orma Flaming, MD     Subjective:  Chief Complaint  Patient presents with   Hypertension    HPI: The patient is a 74 y.o. female who presents today for follow up on Hypertension. Patient denies any chest pain, shortness of breath, changes or changes in vision. Does not add salt to food. Tries to maintain a heart healthy diet. She walks 25 minutes a day. She has been checking her blood pressure at home. On average have been around 140/80's at home.   Review of Systems  Allergies Patient is allergic to other and statins.  Past Medical History Patient  has a past medical history of Anxiety and depression, Arthritis, Chicken pox, Gallstone pancreatitis (08/31/2018), GERD (gastroesophageal reflux disease), Hiatal hernia, osteoporosis (03/17/2016), Hyperlipidemia, Hypertension, Liver failure ([place]), Overactive bladder (06/02/2013), Peptic ulcer, Seasonal allergies, Squamous cell carcinoma, and Status post dilation of esophageal narrowing.  Surgical History Patient  has a past surgical history that includes Tonsillectomy and adenoidectomy; Bunionectomy (Bilateral); Nasal/sinus endoscopy; Appendectomy (1964); and Cholecystectomy (N/A, 09/01/2018).  Family History Pateint's family history includes Bladder Cancer in her maternal uncle; Dementia in her mother; Diabetes in her daughter; Heart disease in her father; Ovarian cancer in her paternal aunt; Prostate cancer in her brother and maternal uncle; Throat cancer in her father; Thyroid cancer in her sister.  Social History Patient  reports that she has never smoked. She has never used smokeless tobacco. She reports current alcohol use. She reports that she does not use drugs.    Objective: Vitals:   11/20/19 1324  BP: (!) 160/88  Pulse: 84  Temp: 98.3 F (36.8 C)  TempSrc: Temporal  SpO2: 97%  Weight: 154 lb (69.9 kg)  Height: 5\' 3"  (1.6 m)    Body mass index is 27.28  kg/m.  Physical Exam     Assessment/plan:      No follow-ups on file.     @[facility] @ 11/20/2019

## 2019-11-27 DIAGNOSIS — D485 Neoplasm of uncertain behavior of skin: Secondary | ICD-10-CM | POA: Diagnosis not present

## 2019-12-04 DIAGNOSIS — C44729 Squamous cell carcinoma of skin of left lower limb, including hip: Secondary | ICD-10-CM | POA: Diagnosis not present

## 2019-12-24 ENCOUNTER — Encounter: Payer: Self-pay | Admitting: Family Medicine

## 2019-12-24 ENCOUNTER — Ambulatory Visit (INDEPENDENT_AMBULATORY_CARE_PROVIDER_SITE_OTHER): Payer: Medicare Other | Admitting: Family Medicine

## 2019-12-24 ENCOUNTER — Ambulatory Visit: Payer: Medicare Other | Admitting: Family Medicine

## 2019-12-24 ENCOUNTER — Other Ambulatory Visit: Payer: Self-pay

## 2019-12-24 VITALS — BP 128/76 | HR 81 | Temp 97.3°F | Ht 63.0 in | Wt 157.2 lb

## 2019-12-24 DIAGNOSIS — I10 Essential (primary) hypertension: Secondary | ICD-10-CM

## 2019-12-24 MED ORDER — AMLODIPINE BESYLATE 5 MG PO TABS: 5.0000 mg | ORAL_TABLET | Freq: Every day | ORAL | 3 refills | Status: DC

## 2019-12-24 MED ORDER — TOLTERODINE TARTRATE ER 4 MG PO CP24: 4.0000 mg | ORAL_CAPSULE | Freq: Every day | ORAL | 2 refills | Status: DC

## 2019-12-24 NOTE — Progress Notes (Signed)
Patient: Crystal Patrick MRN: [id number] DOB: 10-Jan-1946 PCP: Orma Flaming, MD     Subjective:  Chief Complaint  Patient presents with  . Hypertension    HPI: The patient is a 74 y.o. female who presents today for Hypertension. Pt says that her indigestion is making her sick.   Hypertension: Here for follow up of hypertension.  Currently on diovan 160-25mg . I saw her at beginning of July and added on low dose norvasc at 5mg /day. She is here for follow up. She has not taken her blood pressure at all.  Takes medication as prescribed and denies any side effects. Exercise includes watching her grandkids . Weight has been stable. Denies any chest pain, headaches, shortness of breath, vision changes, swelling in lower extremities. She denies any side effects with the new medication. Happy with results.    Review of Systems  Constitutional: Negative for chills, fatigue and fever.  Respiratory: Negative for cough, shortness of breath and wheezing.   Cardiovascular: Negative for chest pain, palpitations and leg swelling.  Gastrointestinal: Negative for nausea and vomiting.  Neurological: Negative for dizziness, light-headedness and headaches.    Allergies Patient is allergic to other and statins.  Past Medical History Patient  has a past medical history of Anxiety and depression, Arthritis, Chicken pox, Gallstone pancreatitis (08/31/2018), GERD (gastroesophageal reflux disease), Hiatal hernia, osteoporosis (03/17/2016), Hyperlipidemia, Hypertension, Liver failure ([place]), Overactive bladder (06/02/2013), Peptic ulcer, Seasonal allergies, Squamous cell carcinoma, and Status post dilation of esophageal narrowing.  Surgical History Patient  has a past surgical history that includes Tonsillectomy and adenoidectomy; Bunionectomy (Bilateral); Nasal/sinus endoscopy; Appendectomy (1964); and Cholecystectomy (N/A, 09/01/2018).  Family History Pateint's family history includes Bladder Cancer in her  maternal uncle; Dementia in her mother; Diabetes in her daughter; Heart disease in her father; Ovarian cancer in her paternal aunt; Prostate cancer in her brother and maternal uncle; Throat cancer in her father; Thyroid cancer in her sister.  Social History Patient  reports that she has never smoked. She has never used smokeless tobacco. She reports current alcohol use. She reports that she does not use drugs.    Objective: Vitals:   12/24/19 1026  BP: 128/76  Pulse: 81  Temp: (!) 97.3 F (36.3 C)  TempSrc: Temporal  SpO2: 98%  Weight: 157 lb 3.2 oz (71.3 kg)  Height: 5\' 3"  (1.6 m)    Body mass index is 27.85 kg/m.  Physical Exam Vitals reviewed.  Constitutional:      Appearance: Normal appearance. She is normal weight.  HENT:     Head: Normocephalic and atraumatic.  Cardiovascular:     Rate and Rhythm: Normal rate and regular rhythm.     Heart sounds: Normal heart sounds.  Pulmonary:     Effort: Pulmonary effort is normal.     Breath sounds: Normal breath sounds.  Abdominal:     General: Abdomen is flat. Bowel sounds are normal.     Palpations: Abdomen is soft.  Skin:    Capillary Refill: Capillary refill takes less than 2 seconds.  Neurological:     Mental Status: She is alert.  Psychiatric:        Mood and Affect: Mood normal.        Behavior: Behavior normal.        Assessment/plan: 1. Essential hypertension Blood pressure is to goal. Continue current anti-hypertensive medications per hpi. Refills given.. Recommended routine exercise and healthy diet including DASH diet and mediterranean diet. Encouraged weight loss. F/u in 6 months.  This visit occurred during the SARS-CoV-2 public health emergency.  Safety protocols were in place, including screening questions prior to the visit, additional usage of staff PPE, and extensive cleaning of exam room while observing appropriate contact time as indicated for disinfecting solutions.     Return in about 6  months (around 06/25/2020) for routine htn f/u .    Orma Flaming, MD [place]   12/24/2019

## 2019-12-31 ENCOUNTER — Encounter: Payer: Self-pay | Admitting: Family Medicine

## 2019-12-31 DIAGNOSIS — C44729 Squamous cell carcinoma of skin of left lower limb, including hip: Secondary | ICD-10-CM | POA: Insufficient documentation

## 2020-01-01 DIAGNOSIS — F331 Major depressive disorder, recurrent, moderate: Secondary | ICD-10-CM | POA: Diagnosis not present

## 2020-01-30 DIAGNOSIS — D485 Neoplasm of uncertain behavior of skin: Secondary | ICD-10-CM | POA: Diagnosis not present

## 2020-01-30 DIAGNOSIS — C44729 Squamous cell carcinoma of skin of left lower limb, including hip: Secondary | ICD-10-CM | POA: Diagnosis not present

## 2020-02-13 DIAGNOSIS — Z23 Encounter for immunization: Secondary | ICD-10-CM | POA: Diagnosis not present

## 2020-03-09 DIAGNOSIS — L570 Actinic keratosis: Secondary | ICD-10-CM | POA: Diagnosis not present

## 2020-03-09 DIAGNOSIS — L821 Other seborrheic keratosis: Secondary | ICD-10-CM | POA: Diagnosis not present

## 2020-03-09 DIAGNOSIS — L905 Scar conditions and fibrosis of skin: Secondary | ICD-10-CM | POA: Diagnosis not present

## 2020-03-09 DIAGNOSIS — Z85828 Personal history of other malignant neoplasm of skin: Secondary | ICD-10-CM | POA: Diagnosis not present

## 2020-03-09 DIAGNOSIS — D225 Melanocytic nevi of trunk: Secondary | ICD-10-CM | POA: Diagnosis not present

## 2020-03-19 ENCOUNTER — Ambulatory Visit (INDEPENDENT_AMBULATORY_CARE_PROVIDER_SITE_OTHER): Payer: Medicare Other

## 2020-03-19 ENCOUNTER — Other Ambulatory Visit: Payer: Self-pay

## 2020-03-19 DIAGNOSIS — Z0000 Encounter for general adult medical examination without abnormal findings: Secondary | ICD-10-CM | POA: Diagnosis not present

## 2020-03-19 NOTE — Patient Instructions (Addendum)
Crystal Patrick , Thank you for taking time to come for your Medicare Wellness Visit. I appreciate your ongoing commitment to your health goals. Please review the following plan we discussed and let me know if I can assist you in the future.   Screening recommendations/referrals: Colonoscopy: Done 08/05/14 Bone Density: 05/23/11 Recommended yearly ophthalmology/optometry visit for glaucoma screening and checkup Recommended yearly dental visit for hygiene and checkup  Vaccinations: Influenza vaccine: Pt will call with date completed for this year Pneumococcal vaccine: Up to date Tdap vaccine: Up to date Shingles vaccine: Completed 10/1 & 04/22/19   Covid-19:Completed 2/24 & 08/13/19  Advanced directives: Please bring a copy of your health care power of attorney and living will to the office at your convenience.  Conditions/risks identified: None at this time  Next appointment: Follow up in one year for your annual wellness visit    Preventive Care 65 Years and Older, Female Preventive care refers to lifestyle choices and visits with your health care provider that can promote health and wellness. What does preventive care include?  A yearly physical exam. This is also called an annual well check.  Dental exams once or twice a year.  Routine eye exams. Ask your health care provider how often you should have your eyes checked.  Personal lifestyle choices, including:  Daily care of your teeth and gums.  Regular physical activity.  Eating a healthy diet.  Avoiding tobacco and drug use.  Limiting alcohol use.  Practicing safe sex.  Taking low-dose aspirin every day.  Taking vitamin and mineral supplements as recommended by your health care provider. What happens during an annual well check? The services and screenings done by your health care provider during your annual well check will depend on your age, overall health, lifestyle risk factors, and family history of  disease. Counseling  Your health care provider may ask you questions about your:  Alcohol use.  Tobacco use.  Drug use.  Emotional well-being.  Home and relationship well-being.  Sexual activity.  Eating habits.  History of falls.  Memory and ability to understand (cognition).  Work and work Statistician.  Reproductive health. Screening  You may have the following tests or measurements:  Height, weight, and BMI.  Blood pressure.  Lipid and cholesterol levels. These may be checked every 5 years, or more frequently if you are over 3 years old.  Skin check.  Lung cancer screening. You may have this screening every year starting at age 17 if you have a 30-pack-year history of smoking and currently smoke or have quit within the past 15 years.  Fecal occult blood test (FOBT) of the stool. You may have this test every year starting at age 37.  Flexible sigmoidoscopy or colonoscopy. You may have a sigmoidoscopy every 5 years or a colonoscopy every 10 years starting at age 67.  Hepatitis C blood test.  Hepatitis B blood test.  Sexually transmitted disease (STD) testing.  Diabetes screening. This is done by checking your blood sugar (glucose) after you have not eaten for a while (fasting). You may have this done every 1-3 years.  Bone density scan. This is done to screen for osteoporosis. You may have this done starting at age 74.  Mammogram. This may be done every 1-2 years. Talk to your health care provider about how often you should have regular mammograms. Talk with your health care provider about your test results, treatment options, and if necessary, the need for more tests. Vaccines  Your health care  provider may recommend certain vaccines, such as:  Influenza vaccine. This is recommended every year.  Tetanus, diphtheria, and acellular pertussis (Tdap, Td) vaccine. You may need a Td booster every 10 years.  Zoster vaccine. You may need this after age  8.  Pneumococcal 13-valent conjugate (PCV13) vaccine. One dose is recommended after age 63.  Pneumococcal polysaccharide (PPSV23) vaccine. One dose is recommended after age 31. Talk to your health care provider about which screenings and vaccines you need and how often you need them. This information is not intended to replace advice given to you by your health care provider. Make sure you discuss any questions you have with your health care provider. Document Released: 06/04/2015 Document Revised: 01/26/2016 Document Reviewed: 03/09/2015 Elsevier Interactive Patient Education  2017 [employer] Prevention in the Home Falls can cause injuries. They can happen to people of all ages. There are many things you can do to make your home safe and to help prevent falls. What can I do on the outside of my home?  Regularly fix the edges of walkways and driveways and fix any cracks.  Remove anything that might make you trip as you walk through a door, such as a raised step or threshold.  Trim any bushes or trees on the path to your home.  Use bright outdoor lighting.  Clear any walking paths of anything that might make someone trip, such as rocks or tools.  Regularly check to see if handrails are loose or broken. Make sure that both sides of any steps have handrails.  Any raised decks and porches should have guardrails on the edges.  Have any leaves, snow, or ice cleared regularly.  Use sand or salt on walking paths during winter.  Clean up any spills in your garage right away. This includes oil or grease spills. What can I do in the bathroom?  Use night lights.  Install grab bars by the toilet and in the tub and shower. Do not use towel bars as grab bars.  Use non-skid mats or decals in the tub or shower.  If you need to sit down in the shower, use a plastic, non-slip stool.  Keep the floor dry. Clean up any water that spills on the floor as soon as it happens.  Remove  soap buildup in the tub or shower regularly.  Attach bath mats securely with double-sided non-slip rug tape.  Do not have throw rugs and other things on the floor that can make you trip. What can I do in the bedroom?  Use night lights.  Make sure that you have a light by your bed that is easy to reach.  Do not use any sheets or blankets that are too big for your bed. They should not hang down onto the floor.  Have a firm chair that has side arms. You can use this for support while you get dressed.  Do not have throw rugs and other things on the floor that can make you trip. What can I do in the kitchen?  Clean up any spills right away.  Avoid walking on wet floors.  Keep items that you use a lot in easy-to-reach places.  If you need to reach something above you, use a strong step stool that has a grab bar.  Keep electrical cords out of the way.  Do not use floor polish or wax that makes floors slippery. If you must use wax, use non-skid floor wax.  Do not have throw  rugs and other things on the floor that can make you trip. What can I do with my stairs?  Do not leave any items on the stairs.  Make sure that there are handrails on both sides of the stairs and use them. Fix handrails that are broken or loose. Make sure that handrails are as long as the stairways.  Check any carpeting to make sure that it is firmly attached to the stairs. Fix any carpet that is loose or worn.  Avoid having throw rugs at the top or bottom of the stairs. If you do have throw rugs, attach them to the floor with carpet tape.  Make sure that you have a light switch at the top of the stairs and the bottom of the stairs. If you do not have them, ask someone to add them for you. What else can I do to help prevent falls?  Wear shoes that:  Do not have high heels.  Have rubber bottoms.  Are comfortable and fit you well.  Are closed at the toe. Do not wear sandals.  If you use a  stepladder:  Make sure that it is fully opened. Do not climb a closed stepladder.  Make sure that both sides of the stepladder are locked into place.  Ask someone to hold it for you, if possible.  Clearly mark and make sure that you can see:  Any grab bars or handrails.  First and last steps.  Where the edge of each step is.  Use tools that help you move around (mobility aids) if they are needed. These include:  Canes.  Walkers.  Scooters.  Crutches.  Turn on the lights when you go into a dark area. Replace any light bulbs as soon as they burn out.  Set up your furniture so you have a clear path. Avoid moving your furniture around.  If any of your floors are uneven, fix them.  If there are any pets around you, be aware of where they are.  Review your medicines with your doctor. Some medicines can make you feel dizzy. This can increase your chance of falling. Ask your doctor what other things that you can do to help prevent falls. This information is not intended to replace advice given to you by your health care provider. Make sure you discuss any questions you have with your health care provider. Document Released: 03/04/2009 Document Revised: 10/14/2015 Document Reviewed: 06/12/2014 Elsevier Interactive Patient Education  2017 [employer].

## 2020-03-19 NOTE — Progress Notes (Signed)
Virtual Visit via Telephone Note  I connected with  Crystal Patrick on 03/19/20 at 10:15 AM EDT by telephone and verified that I am speaking with the correct person using two identifiers.  Medicare Annual Wellness visit completed telephonically due to Covid-19 pandemic.   Persons participating in this call: This Health Coach and this patient.   Location: Patient: Home Provider: Office   I discussed the limitations, risks, security and privacy concerns of performing an evaluation and management service by telephone and the availability of in person appointments. The patient expressed understanding and agreed to proceed.  Unable to perform video visit due to video visit attempted and failed and/or patient does not have video capability.   Some vital signs may be absent or patient reported.   Willette Brace, LPN    Subjective:   Crystal Patrick is a 74 y.o. female who presents for Medicare Annual (Subsequent) preventive examination.  Review of Systems     Cardiac Risk Factors include: advanced age (>64men, >59 women);hypertension     Objective:    There were no vitals filed for this visit. There is no height or weight on file to calculate BMI.  Advanced Directives 03/19/2020 02/17/2019 12/09/2018 08/31/2018 08/31/2018 03/16/2015 01/27/2014  Does Patient Have a Medical Advance Directive? Yes Yes No Yes No Yes Yes  Type of Paramedic of [place];Living will Living will;Healthcare Power of [place];Living will [place];Living will  Does patient want to make changes to medical advance directive? - No - Patient declined - No - Patient declined - No - Patient declined No - Patient declined  Copy of [place] in Chart? No - copy requested No - copy requested - - - No - copy requested No - copy requested  Would patient like information on creating a medical  advance directive? - - Yes (ED - Information included in AVS) - No - Patient declined - -    Current Medications (verified) Outpatient Encounter Medications as of 03/19/2020  Medication Sig  . amLODipine (NORVASC) 5 MG tablet Take 1 tablet (5 mg total) by mouth daily.  Marland Kitchen buPROPion (WELLBUTRIN XL) 150 MG 24 hr tablet Take 150 mg by mouth daily.   . busPIRone (BUSPAR) 15 MG tablet Take 15 mg by mouth. Take 30 mg in AM and 45 in the PM  . Cholecalciferol (VITAMIN D3) 125 MCG (5000 UT) TABS Take 5,000 Units by mouth daily.   Marland Kitchen esomeprazole (NEXIUM) 20 MG capsule Take 40 mg by mouth at bedtime.   Marland Kitchen FLUoxetine (PROZAC) 40 MG capsule Take 40 mg by mouth daily.   . magnesium gluconate (MAGONATE) 500 MG tablet Take 500 mg by mouth daily.  . potassium gluconate 595 (99 K) MG TABS tablet Take 99 mg by mouth daily.   . temazepam (RESTORIL) 15 MG capsule Take 30 mg by mouth at bedtime.   . tolterodine (DETROL LA) 4 MG 24 hr capsule Take 1 capsule (4 mg total) by mouth daily.  . Turmeric (QC TUMERIC COMPLEX PO) Take by mouth.  . valsartan-hydrochlorothiazide (DIOVAN HCT) 160-25 MG tablet Take 1 tablet by mouth daily.  Marland Kitchen VITAMIN C, CALCIUM ASCORBATE, PO Take by mouth.  . vitamin E 400 UNIT capsule Take 400 Units by mouth daily.    No facility-administered encounter medications on file as of 03/19/2020.    Allergies (verified) Other and Statins   History: Past Medical History:  Diagnosis Date  . Anxiety and depression    sees psychiatrist for this  . Arthritis   . Chicken pox   . Gallstone pancreatitis 08/31/2018  . GERD (gastroesophageal reflux disease)    with hiatal hernia, esophageal stricture  . Hiatal hernia   . Hx of osteoporosis 03/17/2016   -did not tol bisphosphonates -did injections -does not wish to re-assess or repeat DEXA -doing walking and Vit D  . Hyperlipidemia    has refused treatment or further evaluation other then lifestyle changes  . Hypertension   . Liver failure  ([place])    due to [place]  . Overactive bladder 06/02/2013  . Peptic ulcer   . Seasonal allergies   . Squamous cell carcinoma   . Status post dilation of esophageal narrowing    Past Surgical History:  Procedure Laterality Date  . APPENDECTOMY  1964  . BUNIONECTOMY Bilateral     1 foot x 2,   . CHOLECYSTECTOMY N/A 09/01/2018   Procedure: LAPAROSCOPIC CHOLECYSTECTOMY WITH INTRAOPERATIVE CHOLANGIOGRAM;  Surgeon: Ileana Roup, MD;  Location: [facility];  Service: General;  Laterality: N/A;  . NASAL/SINUS ENDOSCOPY     x 3  . SKIN CANCER EXCISION     one on left foot and left side of neck removed cancer squamous   . TONSILLECTOMY AND ADENOIDECTOMY     Family History  Problem Relation Age of Onset  . Throat cancer Father   . Heart disease Father   . Thyroid cancer Sister   . Ovarian cancer Paternal Aunt   . Prostate cancer Brother   . Prostate cancer Maternal Uncle        x 2  . Bladder Cancer Maternal Uncle   . Diabetes Daughter   . Dementia Mother    Social History   Socioeconomic History  . Marital status: Married    Spouse name: Not on file  . Number of children: 3  . Years of education: Not on file  . Highest education level: Not on file  Occupational History  . Occupation: home maker  Tobacco Use  . Smoking status: Never Smoker  . Smokeless tobacco: Never Used  Substance and Sexual Activity  . Alcohol use: Yes    Alcohol/week: 0.0 standard drinks    Comment: one drink a day  . Drug use: No  . Sexual activity: Not on file  Other Topics Concern  . Not on file  Social History Narrative   Work or School: retired Web designer Situation: lives with husband, daughter and 2 children      Spiritual Beliefs: Christian, Catholic      Lifestyle: no regular exercise; diet is good             Social Determinants of Radio broadcast assistant Strain: Low Risk   . Difficulty of Paying Living Expenses: Not hard at all  Food Insecurity: No Food  Insecurity  . Worried About Charity fundraiser in the Last Year: Never true  . Ran Out of Food in the Last Year: Never true  Transportation Needs: No Transportation Needs  . Lack of Transportation (Medical): No  . Lack of Transportation (Non-Medical): No  Physical Activity: Sufficiently Active  . Days of Exercise per Week: 6 days  . Minutes of Exercise per Session: 40 min  Stress: No Stress Concern Present  . Feeling of Stress : Not at all  Social Connections: Socially Integrated  . Frequency of Communication with Friends  and Family: More than three times a week  . Frequency of Social Gatherings with Friends and Family: Once a week  . Attends Religious Services: More than 4 times per year  . Active Member of Clubs or Organizations: Yes  . Attends Archivist Meetings: 1 to 4 times per year  . Marital Status: Married    Tobacco Counseling Counseling given: Not Answered   Clinical Intake:  Pre-visit preparation completed: Yes  Pain : No/denies pain     BMI - recorded: 27.85 Nutritional Status: BMI 25 -29 Overweight Nutritional Risks: None Diabetes: No  How often do you need to have someone help you when you read instructions, pamphlets, or other written materials from your doctor or pharmacy?: 1 - Never  Diabetic?No  Interpreter Needed?: No  Information entered by :: Charlott Rakes, LPN   Activities of Daily Living In your present state of health, do you have any difficulty performing the following activities: 03/19/2020  Hearing? Y  Comment mild loss  Vision? N  Difficulty concentrating or making decisions? Y  Comment memory at times  Walking or climbing stairs? N  Dressing or bathing? N  Doing errands, shopping? N  Preparing Food and eating ? N  Using the Toilet? N  In the past six months, have you accidently leaked urine? N  Do you have problems with loss of bowel control? N  Managing your Medications? N  Managing your Finances? N  Housekeeping  or managing your Housekeeping? N  Some recent data might be hidden    Patient Care Team: Orma Flaming, MD as PCP - General (Family Medicine) [name], MD as Consulting Physician (Psychiatry) Rutherford Guys, MD as Consulting Physician (Ophthalmology) Druscilla Brownie, MD as Consulting Physician (Dermatology)  Indicate any recent Medical Services you may have received from other than [facility] providers in the past year (date may be approximate).     Assessment:   This is a routine wellness examination for Crystal Patrick.  Hearing/Vision screen  Hearing Screening   125Hz  250Hz  500Hz  1000Hz  2000Hz  3000Hz  4000Hz  6000Hz  8000Hz   Right ear:           Left ear:           Comments: Mild loss   Vision Screening Comments: Pt follows up with new Dr related to retirement of other provider  Dietary issues and exercise activities discussed: Current Exercise Habits: Home exercise routine, Type of exercise: walking, Time (Minutes): 40, Frequency (Times/Week): 5, Weekly Exercise (Minutes/Week): 200  Goals    . Patient Stated     Find healthy ways to reduce stress     . Patient Stated     None at this time      Depression Screen PHQ 2/9 Scores 03/19/2020 12/24/2019 11/20/2019 06/25/2019 02/20/2019 02/17/2019 11/07/2018  PHQ - 2 Score 0 0 0 0 0 2 2  PHQ- 9 Score - - 0 8 0 - 8  Exception Documentation - - - - - - -    Fall Risk Fall Risk  03/19/2020 12/24/2019 06/25/2019 02/17/2019 11/07/2018  Falls in the past year? 1 0 1 1 0  Number falls in past yr: 1 - 1 0 0  Injury with Fall? 1 - 1 1 0  Comment cracked ribs during walking dog - - - -  Risk for fall due to : Impaired vision;Impaired balance/gait;Impaired mobility No Fall Risks Other (Comment) Medication side effect -  Risk for fall due to: Comment - - Pt fell over dog a couple of  weeks ago. She had cracked ribs (Left), and muscle soreness on right side of her neck. - -  Follow up Falls prevention discussed - - Education provided;Falls evaluation  completed;Falls prevention discussed -    Any stairs in or around the home? No  If so, are there any without handrails? No  Home free of loose throw rugs in walkways, pet beds, electrical cords, etc? Yes  Adequate lighting in your home to reduce risk of falls? Yes   ASSISTIVE DEVICES UTILIZED TO PREVENT FALLS:  Life alert? No  Use of a cane, walker or w/c? No  Grab bars in the bathroom? No  Shower chair or bench in shower? Yes  Elevated toilet seat or a handicapped toilet? Yes   TIMED UP AND GO:  Was the test performed? No .   Cognitive Function:     6CIT Screen 03/19/2020 02/17/2019  What Year? 0 points 0 points  What month? 0 points 0 points  What time? - 0 points  Count back from 20 0 points 0 points  Months in reverse 0 points 0 points  Repeat phrase 0 points 0 points  Total Score - 0    Immunizations Immunization History  Administered Date(s) Administered  . Fluad Quad(high Dose 65+) 02/10/2019  . Influenza, High Dose Seasonal PF 03/16/2015, 03/17/2016, 02/21/2017, 02/05/2018  . Influenza,inj,Quad PF,6+ Mos 01/22/2014  . Influenza-Unspecified 02/24/2014  . PFIZER SARS-COV-2 Vaccination 07/16/2019, 08/13/2019  . Pneumococcal Conjugate-13 01/22/2014  . Pneumococcal Polysaccharide-23 07/28/2014  . Td 02/20/2019  . Zoster Recombinat (Shingrix) 02/20/2019, 04/22/2019    TDAP status: Up to date   Flu Vaccine status: Up to date pt will call with date completed for this year  Pneumococcal vaccine status: Up to date   Covid-19 vaccine status: Completed vaccines  Qualifies for Shingles Vaccine? Yes   Zostavax completed Yes   Shingrix Completed?: Yes  Screening Tests Health Maintenance  Topic Date Due  . COLONOSCOPY  08/05/2019  . INFLUENZA VACCINE  12/21/2019  . TETANUS/TDAP  02/19/2029  . DEXA SCAN  Completed  . COVID-19 Vaccine  Completed  . Hepatitis C Screening  Completed  . PNA vac Low Risk Adult  Completed    Health Maintenance  Health  Maintenance Due  Topic Date Due  . COLONOSCOPY  08/05/2019  . INFLUENZA VACCINE  12/21/2019    Colorectal cancer screening: Completed 08/05/14. Repeat every 5 years Mammogram status: No longer required.  Bone Density status: Completed 05/23/11. Results reflect: Bone density results: OSTEOPENIA. Repeat every 2 years.    Additional Screening:  Hepatitis C Screening:  Completed 03/16/15  Vision Screening: Recommended annual ophthalmology exams for early detection of glaucoma and other disorders of the eye. Is the patient up to date with their annual eye exam?  No  Who is the provider or what is the name of the office in which the patient attends annual eye exams? Pt will start with husbands provider    Dental Screening: Recommended annual dental exams for proper oral hygiene  Community Resource Referral / Chronic Care Management: CRR required this visit?  No   CCM required this visit?  No      Plan:     I have personally reviewed and noted the following in the patient's chart:   . Medical and social history . Use of alcohol, tobacco or illicit drugs  . Current medications and supplements . Functional ability and status . Nutritional status . Physical activity . Advanced directives . List of other physicians .  Hospitalizations, surgeries, and ER visits in previous 12 months . Vitals . Screenings to include cognitive, depression, and falls . Referrals and appointments  In addition, I have reviewed and discussed with patient certain preventive protocols, quality metrics, and best practice recommendations. A written personalized care plan for preventive services as well as general preventive health recommendations were provided to patient.     Willette Brace, LPN   [id number]   Nurse Notes: None

## 2020-05-25 ENCOUNTER — Other Ambulatory Visit: Payer: Self-pay | Admitting: Family Medicine

## 2020-05-25 ENCOUNTER — Telehealth: Payer: Self-pay

## 2020-05-25 DIAGNOSIS — N644 Mastodynia: Secondary | ICD-10-CM

## 2020-05-25 DIAGNOSIS — Z1231 Encounter for screening mammogram for malignant neoplasm of breast: Secondary | ICD-10-CM

## 2020-05-25 NOTE — Telephone Encounter (Signed)
Pt would like to have a mammogram th the [facility]. They are requesting a diagnostic referral and the date of her last mammogram.

## 2020-05-26 NOTE — Telephone Encounter (Signed)
We are going to have to call her and see why she needs diagnostic. Breast pain/lump/etc?  Dr. Artis Flock

## 2020-05-26 NOTE — Telephone Encounter (Signed)
Melitta, I can not find her last mammogram. Can you call and ask her?  Thanks! Aw

## 2020-05-26 NOTE — Telephone Encounter (Signed)
Found it! Per E.Earlene Plater note on 11/07/2018. It was done 10/21/2015.

## 2020-05-27 NOTE — Telephone Encounter (Signed)
Referral placed.  Lander Eslick, MD [facility]   

## 2020-05-27 NOTE — Telephone Encounter (Signed)
Pt says left breast pain.

## 2020-05-27 NOTE — Addendum Note (Signed)
Addended by: Orland Mustard on: 05/27/2020 03:08 PM   Modules accepted: Orders

## 2020-05-31 DIAGNOSIS — F331 Major depressive disorder, recurrent, moderate: Secondary | ICD-10-CM | POA: Diagnosis not present

## 2020-06-15 ENCOUNTER — Other Ambulatory Visit: Payer: Self-pay | Admitting: Family Medicine

## 2020-06-15 DIAGNOSIS — N644 Mastodynia: Secondary | ICD-10-CM

## 2020-06-25 ENCOUNTER — Ambulatory Visit: Payer: Medicare Other | Admitting: Family Medicine

## 2020-07-05 ENCOUNTER — Other Ambulatory Visit: Payer: Self-pay

## 2020-07-05 ENCOUNTER — Ambulatory Visit (INDEPENDENT_AMBULATORY_CARE_PROVIDER_SITE_OTHER): Payer: Medicare Other | Admitting: Family Medicine

## 2020-07-05 ENCOUNTER — Encounter: Payer: Self-pay | Admitting: Family Medicine

## 2020-07-05 VITALS — BP 134/80 | HR 81 | Temp 98.3°F | Ht 63.0 in | Wt 164.4 lb

## 2020-07-05 DIAGNOSIS — E782 Mixed hyperlipidemia: Secondary | ICD-10-CM | POA: Diagnosis not present

## 2020-07-05 DIAGNOSIS — I951 Orthostatic hypotension: Secondary | ICD-10-CM | POA: Diagnosis not present

## 2020-07-05 DIAGNOSIS — I10 Essential (primary) hypertension: Secondary | ICD-10-CM | POA: Diagnosis not present

## 2020-07-05 LAB — COMPREHENSIVE METABOLIC PANEL
ALT: 20 U/L (ref 0–35)
AST: 19 U/L (ref 0–37)
Albumin: 4.6 g/dL (ref 3.5–5.2)
Alkaline Phosphatase: 51 U/L (ref 39–117)
BUN: 16 mg/dL (ref 6–23)
CO2: 28 mEq/L (ref 19–32)
Calcium: 9.6 mg/dL (ref 8.4–10.5)
Chloride: 99 mEq/L (ref 96–112)
Creatinine, Ser: 0.76 mg/dL (ref 0.40–1.20)
GFR: 78 mL/min (ref >60.00)
Glucose, Bld: 101 mg/dL — ABNORMAL HIGH (ref 70–99)
Potassium: 3.9 mEq/L (ref 3.5–5.1)
Sodium: 136 mEq/L (ref 135–145)
Total Bilirubin: 0.8 mg/dL (ref 0.2–1.2)
Total Protein: 7.4 g/dL (ref 6.0–8.3)

## 2020-07-05 LAB — LIPID PANEL
Cholesterol: 287 mg/dL — ABNORMAL HIGH (ref 0–200)
HDL: 54 mg/dL (ref >39.00)
LDL Cholesterol: 197 mg/dL — ABNORMAL HIGH (ref 0–99)
NonHDL: 232.96
Total CHOL/HDL Ratio: 5
Triglycerides: 179 mg/dL — ABNORMAL HIGH (ref 0.0–149.0)
VLDL: 35.8 mg/dL (ref 0.0–40.0)

## 2020-07-05 LAB — CBC WITH DIFFERENTIAL/PLATELET
Basophils Absolute: 0.1 10*3/uL (ref 0.0–0.1)
Basophils Relative: 0.9 % (ref 0.0–3.0)
Eosinophils Absolute: 0.1 10*3/uL (ref 0.0–0.7)
Eosinophils Relative: 2.7 % (ref 0.0–5.0)
HCT: 42.9 % (ref 36.0–46.0)
Hemoglobin: 14.8 g/dL (ref 12.0–15.0)
Lymphocytes Relative: 29 % (ref 12.0–46.0)
Lymphs Abs: 1.6 10*3/uL (ref 0.7–4.0)
MCHC: 34.4 g/dL (ref 30.0–36.0)
MCV: 91.1 fl (ref 78.0–100.0)
Monocytes Absolute: 0.5 10*3/uL (ref 0.1–1.0)
Monocytes Relative: 9.7 % (ref 3.0–12.0)
Neutro Abs: 3.2 10*3/uL (ref 1.4–7.7)
Neutrophils Relative %: 57.7 % (ref 43.0–77.0)
Platelets: 305 10*3/uL (ref 150.0–400.0)
RBC: 4.71 Mil/uL (ref 3.87–5.11)
RDW: 13.8 % (ref 11.5–15.5)
WBC: 5.6 10*3/uL (ref 4.0–10.5)

## 2020-07-05 LAB — MICROALBUMIN / CREATININE URINE RATIO
Creatinine,U: 109.8 mg/dL
Microalb Creat Ratio: 1.2 mg/g (ref 0.0–30.0)
Microalb, Ur: 1.3 mg/dL (ref 0.0–1.9)

## 2020-07-05 MED ORDER — AMLODIPINE BESYLATE 2.5 MG PO TABS: 2.5000 mg | ORAL_TABLET | Freq: Every day | ORAL | 0 refills | Status: DC

## 2020-07-05 NOTE — Patient Instructions (Addendum)
1) you have orthostatic hypotension, so your blood pressure is dropping when you go from laying to sitting likely causing your dizziness. We are going to decrease you amlodipine down to 2.5mg  daily. I sent in a new px for this and check labs. I want you to really increase your water intake as well and take your time sitting up. If this continues or gets worse, let me know. Check blood pressure a couple times a week, but I do not think this will fluctuate it much.   2) routine labs today and will check dizzy labs.   3) up to date on everything! Way to go!   le'ts see you back in 3 months to make sure you are doing okay or if you are feeling good can extend to 6 months.   Hang in there! Dr. Rogers Blocker    Orthostatic Hypotension Blood pressure is a measurement of how strongly, or weakly, your blood is pressing against the walls of your arteries. Orthostatic hypotension is a sudden drop in blood pressure that happens when you quickly change positions, such as when you get up from sitting or lying down. Arteries are blood vessels that carry blood from your heart throughout your body. When blood pressure is too low, you may not get enough blood to your brain or to the rest of your organs. This can cause weakness, light-headedness, rapid heartbeat, and fainting. This can last for just a few seconds or for up to a few minutes. Orthostatic hypotension is usually not a serious problem. However, if it happens frequently or gets worse, it may be a sign of something more serious. What are the causes? This condition may be caused by:  Sudden changes in posture, such as standing up quickly after you have been sitting or lying down.  Blood loss.  Loss of body fluids (dehydration).  Heart problems.  Hormone (endocrine) problems.  Pregnancy.  Severe infection.  Lack of certain nutrients.  Severe allergic reactions (anaphylaxis).  Certain medicines, such as blood pressure medicine or medicines that make  the body lose excess fluids (diuretics). Sometimes, this condition can be caused by not taking medicine as directed, such as taking too much of a certain medicine. What increases the risk? The following factors may make you more likely to develop this condition:  Age. Risk increases as you get older.  Conditions that affect the heart or the central nervous system.  Taking certain medicines, such as blood pressure medicine or diuretics.  Being pregnant. What are the signs or symptoms? Symptoms of this condition may include:  Weakness.  Light-headedness.  Dizziness.  Blurred vision.  Fatigue.  Rapid heartbeat.  Fainting, in severe cases. How is this diagnosed? This condition is diagnosed based on:  Your medical history.  Your symptoms.  Your blood pressure measurement. Your health care provider will check your blood pressure when you are: ? Lying down. ? Sitting. ? Standing. A blood pressure reading is recorded as two numbers, such as "120 over 80" (or 120/80). The first ("top") number is called the systolic pressure. It is a measure of the pressure in your arteries as your heart beats. The second ("bottom") number is called the diastolic pressure. It is a measure of the pressure in your arteries when your heart relaxes between beats. Blood pressure is measured in a unit called mm Hg. Healthy blood pressure for most adults is 120/80. If your blood pressure is below 90/60, you may be diagnosed with hypotension. Other information or tests that  may be used to diagnose orthostatic hypotension include:  Your other vital signs, such as your heart rate and temperature.  Blood tests.  Tilt table test. For this test, you will be safely secured to a table that moves you from a lying position to an upright position. Your heart rhythm and blood pressure will be monitored during the test. How is this treated? This condition may be treated by:  Changing your diet. This may involve  eating more salt (sodium) or drinking more water.  Taking medicines to raise your blood pressure.  Changing the dosage of certain medicines you are taking that might be lowering your blood pressure.  Wearing compression stockings. These stockings help to prevent blood clots and reduce swelling in your legs. In some cases, you may need to go to the hospital for:  Fluid replacement. This means you will receive fluids through an IV.  Blood replacement. This means you will receive donated blood through an IV (transfusion).  Treating an infection or heart problems, if this applies.  Monitoring. You may need to be monitored while medicines that you are taking wear off. Follow these instructions at home: Eating and drinking  Drink enough fluid to keep your urine pale yellow.  Eat a healthy diet, and follow instructions from your health care provider about eating or drinking restrictions. A healthy diet includes: ? Fresh fruits and vegetables. ? Whole grains. ? Lean meats. ? Low-fat dairy products.  Eat extra salt only as directed. Do not add extra salt to your diet unless your health care provider told you to do that.  Eat frequent, small meals.  Avoid standing up suddenly after eating.   Medicines  Take over-the-counter and prescription medicines only as told by your health care provider. ? Follow instructions from your health care provider about changing the dosage of your current medicines, if this applies. ? Do not stop or adjust any of your medicines on your own. General instructions  Wear compression stockings as told by your health care provider.  Get up slowly from lying down or sitting positions. This gives your blood pressure a chance to adjust.  Avoid hot showers and excessive heat as directed by your health care provider.  Return to your normal activities as told by your health care provider. Ask your health care provider what activities are safe for you.  Do not  use any products that contain nicotine or tobacco, such as cigarettes, e-cigarettes, and chewing tobacco. If you need help quitting, ask your health care provider.  Keep all follow-up visits as told by your health care provider. This is important.   Contact a health care provider if you:  Vomit.  Have diarrhea.  Have a fever for more than 2-3 days.  Feel more thirsty than usual.  Feel weak and tired. Get help right away if you:  Have chest pain.  Have a fast or irregular heartbeat.  Develop numbness in any part of your body.  Cannot move your arms or your legs.  Have trouble speaking.  Become sweaty or feel light-headed.  Faint.  Feel short of breath.  Have trouble staying awake.  Feel confused. Summary  Orthostatic hypotension is a sudden drop in blood pressure that happens when you quickly change positions.  Orthostatic hypotension is usually not a serious problem.  It is diagnosed by having your blood pressure taken lying down, sitting, and then standing.  It may be treated by changing your diet or adjusting your medicines. This information is  not intended to replace advice given to you by your health care provider. Make sure you discuss any questions you have with your health care provider. Document Revised: 11/01/2017 Document Reviewed: 11/01/2017 Elsevier Patient Education  [place].

## 2020-07-05 NOTE — Progress Notes (Signed)
Patient: Crystal Patrick MRN: [id number] DOB: 12-Dec-1945 PCP: Orma Flaming, MD     Subjective:  Chief Complaint  Patient presents with  . Hypertension  . Dizziness    Pt says that she has been off balance from time to time, for a couple of weeks.   . Hyperlipidemia    HPI: The patient is a 75 y.o. female who presents today for Hypertension, hyperlipidemia and dizziness.  Hypertension: Here for follow up of hypertension.  Currently on diovan 16-25mg  daily and norvasc 5mg /day.  Takes medication as prescribed and denies any side effects. Exercise includes 5x week with walking about 25 minutes. Weight has been stable. Denies any chest pain, headaches, shortness of breath, vision changes, swelling in lower extremities.   Hyperlipidemia  Allergy to statin. May be candidate for zetia or repatha. Recheck today. She is fasting.  The 10-year ASCVD risk score Mikey Bussing DC Jr., et al., 2013) is: 16%  Dizziness She states her balance gets off. She states she has to be careful when she stands up. She states it happens daily. She states it feels like the world is spinning and she is standing still. Only lasts a few seconds. She has no dizziness if she is turning her head, only up an down. She has dizziness going from lying to sitting. It happens throughout the day. She has no ringing in the ears or hearing loss. No vision changes, chest pain, palpitations.    Review of Systems  Constitutional: Negative for chills, fatigue and fever.  HENT: Negative for dental problem, ear pain, hearing loss and trouble swallowing.   Eyes: Negative for visual disturbance.  Respiratory: Negative for cough, chest tightness and shortness of breath.   Cardiovascular: Negative for chest pain, palpitations and leg swelling.  Gastrointestinal: Negative for abdominal pain, blood in stool, diarrhea and nausea.  Endocrine: Negative for cold intolerance, polydipsia, polyphagia and polyuria.  Genitourinary: Negative for dysuria  and hematuria.  Musculoskeletal: Negative for arthralgias.  Skin: Negative for rash.  Neurological: Positive for dizziness. Negative for headaches.  Psychiatric/Behavioral: Negative for dysphoric mood and sleep disturbance. The patient is not nervous/anxious.     Allergies Patient is allergic to other and statins.  Past Medical History Patient  has a past medical history of Anxiety and depression, Arthritis, Chicken pox, Gallstone pancreatitis (08/31/2018), GERD (gastroesophageal reflux disease), Hiatal hernia, osteoporosis (03/17/2016), Hyperlipidemia, Hypertension, Liver failure ([place]), Overactive bladder (06/02/2013), Peptic ulcer, Seasonal allergies, Squamous cell carcinoma, and Status post dilation of esophageal narrowing.  Surgical History Patient  has a past surgical history that includes Tonsillectomy and adenoidectomy; Bunionectomy (Bilateral); Nasal/sinus endoscopy; Appendectomy (1964); Cholecystectomy (N/A, 09/01/2018); and Skin cancer excision.  Family History Pateint's family history includes Bladder Cancer in her maternal uncle; Dementia in her mother; Diabetes in her daughter; Heart disease in her father; Ovarian cancer in her paternal aunt; Prostate cancer in her brother and maternal uncle; Throat cancer in her father; Thyroid cancer in her sister.  Social History Patient  reports that she has never smoked. She has never used smokeless tobacco. She reports current alcohol use. She reports that she does not use drugs.    Objective: Vitals:   07/05/20 0941  BP: 134/80  Pulse: 81  Temp: 98.3 F (36.8 C)  TempSrc: Temporal  SpO2: 97%  Weight: 164 lb 6.4 oz (74.6 kg)  Height: 5\' 3"  (1.6 m)    Body mass index is 29.12 kg/m.  Physical Exam Vitals reviewed.  Constitutional:      Appearance: Normal appearance.  She is well-developed and well-nourished.  HENT:     Head: Normocephalic and atraumatic.     Right Ear: Tympanic membrane, ear canal and external ear normal.      Left Ear: Tympanic membrane, ear canal and external ear normal.     Nose: Nose normal.     Mouth/Throat:     Mouth: Oropharynx is clear and moist. Mucous membranes are moist.  Eyes:     Extraocular Movements: Extraocular movements intact and EOM normal.     Conjunctiva/sclera: Conjunctivae normal.     Pupils: Pupils are equal, round, and reactive to light.  Neck:     Thyroid: No thyromegaly.  Cardiovascular:     Rate and Rhythm: Normal rate and regular rhythm.     Pulses: Normal pulses and intact distal pulses.     Heart sounds: Murmur heard.    Pulmonary:     Effort: Pulmonary effort is normal.     Breath sounds: Normal breath sounds.  Abdominal:     General: Bowel sounds are normal. There is no distension.     Palpations: Abdomen is soft.     Tenderness: There is no abdominal tenderness.  Musculoskeletal:     Cervical back: Normal range of motion and neck supple.  Lymphadenopathy:     Cervical: No cervical adenopathy.  Skin:    General: Skin is warm and dry.     Capillary Refill: Capillary refill takes less than 2 seconds.     Findings: No rash.  Neurological:     General: No focal deficit present.     Mental Status: She is alert and oriented to person, place, and time.     Cranial Nerves: No cranial nerve deficit.     Coordination: Coordination normal.     Deep Tendon Reflexes: Reflexes normal.     Comments: Negative dix halpike bilaterally   Psychiatric:        Mood and Affect: Mood and affect normal.        Behavior: Behavior normal.        Assessment/plan: 1. Essential hypertension She is to goal today. Orthostatics are about + going from lying to sitting. We are going to just cut her norvasc back to 2.5mg /day and have her keep a log for a few days a week. Close f/u in 3 months.  - CBC with Differential/Platelet - Comprehensive metabolic panel - Microalbumin / creatinine urine ratio  2. Mixed hyperlipidemia May be good candidate for zetia/repatha.  - Lipid  panel  3. Orthostatic hypotension -discussed with her that it's likely age related, but want her to try the following...  -increase water intake -checking labs -had echo less than a year ago -take her time going from laying to sitting- -f/u in 3 months time. Let me know if getting worse.    This visit occurred during the SARS-CoV-2 public health emergency.  Safety protocols were in place, including screening questions prior to the visit, additional usage of staff PPE, and extensive cleaning of exam room while observing appropriate contact time as indicated for disinfecting solutions.     Return in about 3 months (around 10/02/2020) for blood pressure/orthostatic hypotension .    Orma Flaming, MD [place]   07/05/2020

## 2020-07-07 ENCOUNTER — Other Ambulatory Visit: Payer: Self-pay | Admitting: Family Medicine

## 2020-07-07 ENCOUNTER — Other Ambulatory Visit: Payer: Self-pay

## 2020-07-07 ENCOUNTER — Other Ambulatory Visit (INDEPENDENT_AMBULATORY_CARE_PROVIDER_SITE_OTHER): Payer: Medicare Other

## 2020-07-07 ENCOUNTER — Encounter: Payer: Self-pay | Admitting: Family Medicine

## 2020-07-07 DIAGNOSIS — R7303 Prediabetes: Secondary | ICD-10-CM | POA: Insufficient documentation

## 2020-07-07 LAB — HEMOGLOBIN A1C: Hgb A1c MFr Bld: 5.7 % (ref 4.6–6.5)

## 2020-07-07 NOTE — Progress Notes (Signed)
labs

## 2020-07-12 ENCOUNTER — Other Ambulatory Visit: Payer: Self-pay | Admitting: Family Medicine

## 2020-07-12 MED ORDER — VALSARTAN-HYDROCHLOROTHIAZIDE 160-25 MG PO TABS: 1.0000 | ORAL_TABLET | Freq: Every day | ORAL | 3 refills | Status: AC

## 2020-07-23 ENCOUNTER — Other Ambulatory Visit: Payer: Self-pay

## 2020-07-23 ENCOUNTER — Ambulatory Visit
Admission: RE | Admit: 2020-07-23 | Discharge: 2020-07-23 | Disposition: A | Discharge status: Home / Self Care | Payer: Medicare Other | Source: Ambulatory Visit | Attending: Family Medicine | Admitting: Family Medicine

## 2020-07-23 DIAGNOSIS — R922 Inconclusive mammogram: Secondary | ICD-10-CM | POA: Diagnosis not present

## 2020-07-23 DIAGNOSIS — N644 Mastodynia: Secondary | ICD-10-CM

## 2020-07-23 DIAGNOSIS — N6489 Other specified disorders of breast: Secondary | ICD-10-CM | POA: Diagnosis not present

## 2020-07-23 DIAGNOSIS — R928 Other abnormal and inconclusive findings on diagnostic imaging of breast: Secondary | ICD-10-CM | POA: Diagnosis not present

## 2020-07-27 ENCOUNTER — Other Ambulatory Visit: Payer: Medicare Other

## 2020-07-28 ENCOUNTER — Other Ambulatory Visit: Payer: Medicare Other

## 2020-07-29 ENCOUNTER — Ambulatory Visit (INDEPENDENT_AMBULATORY_CARE_PROVIDER_SITE_OTHER): Payer: Medicare Other | Admitting: Family Medicine

## 2020-07-29 ENCOUNTER — Encounter: Payer: Self-pay | Admitting: Family Medicine

## 2020-07-29 ENCOUNTER — Ambulatory Visit (INDEPENDENT_AMBULATORY_CARE_PROVIDER_SITE_OTHER): Payer: Medicare Other

## 2020-07-29 ENCOUNTER — Other Ambulatory Visit: Payer: Self-pay

## 2020-07-29 VITALS — BP 148/80 | HR 90 | Temp 97.9°F | Ht 63.0 in | Wt 162.8 lb

## 2020-07-29 DIAGNOSIS — G8929 Other chronic pain: Secondary | ICD-10-CM

## 2020-07-29 DIAGNOSIS — M19012 Primary osteoarthritis, left shoulder: Secondary | ICD-10-CM | POA: Diagnosis not present

## 2020-07-29 DIAGNOSIS — M25512 Pain in left shoulder: Secondary | ICD-10-CM | POA: Diagnosis not present

## 2020-07-29 NOTE — Patient Instructions (Signed)
-  voltaren gel on your shoulder. Can continue ice or heat.   -xray today, do not suspect this will show anything but need for insurance purposes -MRI shoulder, they will call you with this!   Let me know if you need anything! Dr. Rogers Blocker

## 2020-07-29 NOTE — Progress Notes (Signed)
Patient: Crystal Patrick MRN: [id number] DOB: Oct 10, 1945 PCP: Orma Flaming, MD     Subjective:  Chief Complaint  Patient presents with  . Shoulder Injury    Pt fell 4 months ago, and is still having constant shoulder pain. She takes Tylenol and Ibuprofen for pain.     HPI: The patient is a 75 y.o. female who presents today for (left) shoulder injury due to a fall 4 months ago. She was trying to get her husband from chair to apartment and she fell onto her left shoulder. She had no immediate swelling, but pain. She is able to move it, but she can not take her arm all the way behind her back. She is right handed. She has tried lidocaine and it did nothing. She takes tylenol and ibuprofen if it really bothers her. If using her left arm, pain will get up to a 5-6/10 or higher. Pain is worse with using her arm behind her. She has no previous shoulder injuries.   Review of Systems  Constitutional: Negative for chills and fever.  Musculoskeletal: Positive for arthralgias. Negative for joint swelling and myalgias.    Allergies Patient is allergic to other and statins.  Past Medical History Patient  has a past medical history of Anxiety and depression, Arthritis, Chicken pox, Gallstone pancreatitis (08/31/2018), GERD (gastroesophageal reflux disease), Hiatal hernia, osteoporosis (03/17/2016), Hyperlipidemia, Hypertension, Liver failure ([place]), Overactive bladder (06/02/2013), Peptic ulcer, Seasonal allergies, Squamous cell carcinoma, and Status post dilation of esophageal narrowing.  Surgical History Patient  has a past surgical history that includes Tonsillectomy and adenoidectomy; Bunionectomy (Bilateral); Nasal/sinus endoscopy; Appendectomy (1964); Cholecystectomy (N/A, 09/01/2018); and Skin cancer excision.  Family History Pateint's family history includes Bladder Cancer in her maternal uncle; Dementia in her mother; Diabetes in her daughter; Heart disease in her father; Ovarian cancer in her  paternal aunt; Prostate cancer in her brother and maternal uncle; Throat cancer in her father; Thyroid cancer in her sister.  Social History Patient  reports that she has never smoked. She has never used smokeless tobacco. She reports current alcohol use. She reports that she does not use drugs.    Objective: Vitals:   07/29/20 0758  BP: (!) 148/80  Pulse: 90  Temp: 97.9 F (36.6 C)  TempSrc: Temporal  SpO2: 97%  Weight: 162 lb 12.8 oz (73.8 kg)  Height: 5\' 3"  (1.6 m)    Body mass index is 28.84 kg/m.  Physical Exam Vitals reviewed.  Constitutional:      Appearance: Normal appearance. She is obese.  HENT:     Head: Normocephalic and atraumatic.  Pulmonary:     Effort: Pulmonary effort is normal.  Musculoskeletal:     Comments: Left shoulder:  Pain with passive arc, +gerber test, negative empty can test. Pain with adduction of arm across chest. No erythema or edema over shoulder. No ttp over joint.   Neurological:     Mental Status: She is alert.    Left shoulder xray: ? Mild OA changes. No acute findings.  Official read pending.     Assessment/plan: 1. Chronic left shoulder pain Concern for rotator cuff issue. MRI shoulder ordered. Continue with otc pain relief and also recommended physical therapy.  - DG Shoulder Left; Future - MR Shoulder Left Wo Contrast; Future    This visit occurred during the SARS-CoV-2 public health emergency.  Safety protocols were in place, including screening questions prior to the visit, additional usage of staff PPE, and extensive cleaning of exam room while observing  appropriate contact time as indicated for disinfecting solutions.     Return if symptoms worsen or fail to improve.   Orma Flaming, MD [place]   07/29/2020

## 2020-08-28 ENCOUNTER — Other Ambulatory Visit: Payer: Self-pay

## 2020-08-28 ENCOUNTER — Ambulatory Visit
Admission: RE | Admit: 2020-08-28 | Discharge: 2020-08-28 | Disposition: A | Discharge status: Home / Self Care | Payer: Medicare Other | Source: Ambulatory Visit | Attending: Family Medicine | Admitting: Family Medicine

## 2020-08-28 DIAGNOSIS — M25512 Pain in left shoulder: Secondary | ICD-10-CM

## 2020-08-28 DIAGNOSIS — M25412 Effusion, left shoulder: Secondary | ICD-10-CM | POA: Diagnosis not present

## 2020-08-28 DIAGNOSIS — G8929 Other chronic pain: Secondary | ICD-10-CM

## 2020-08-28 DIAGNOSIS — M19012 Primary osteoarthritis, left shoulder: Secondary | ICD-10-CM | POA: Diagnosis not present

## 2020-08-31 ENCOUNTER — Other Ambulatory Visit: Payer: Self-pay | Admitting: Family Medicine

## 2020-08-31 ENCOUNTER — Telehealth: Payer: Self-pay

## 2020-08-31 DIAGNOSIS — M19012 Primary osteoarthritis, left shoulder: Secondary | ICD-10-CM

## 2020-08-31 NOTE — Telephone Encounter (Signed)
I spoke with the pt to make her aware that Dr. Rogers Blocker is out of the office, and once the MRI has been reviewed someone from our office will give her a call.

## 2020-08-31 NOTE — Telephone Encounter (Signed)
Pt is requesting a call in regards to her MRI results

## 2020-09-20 ENCOUNTER — Other Ambulatory Visit: Payer: Self-pay | Admitting: Family Medicine

## 2020-09-28 DIAGNOSIS — F331 Major depressive disorder, recurrent, moderate: Secondary | ICD-10-CM | POA: Diagnosis not present

## 2020-09-29 DIAGNOSIS — M19012 Primary osteoarthritis, left shoulder: Secondary | ICD-10-CM | POA: Diagnosis not present

## 2020-10-04 ENCOUNTER — Other Ambulatory Visit: Payer: Self-pay

## 2020-10-04 ENCOUNTER — Ambulatory Visit (INDEPENDENT_AMBULATORY_CARE_PROVIDER_SITE_OTHER): Payer: Medicare Other | Admitting: Family Medicine

## 2020-10-04 ENCOUNTER — Encounter: Payer: Self-pay | Admitting: Family Medicine

## 2020-10-04 VITALS — BP 128/70 | HR 66 | Temp 98.4°F | Ht 63.0 in | Wt 158.4 lb

## 2020-10-04 DIAGNOSIS — I951 Orthostatic hypotension: Secondary | ICD-10-CM | POA: Diagnosis not present

## 2020-10-04 DIAGNOSIS — I10 Essential (primary) hypertension: Secondary | ICD-10-CM | POA: Diagnosis not present

## 2020-10-04 NOTE — Patient Instructions (Signed)
-  stop norvasc completely! Blood pressure is great and will see if this helps orthostasis -increase water  -when you move to [place], would look at starting repatha. It's a monoclonal drug to help cholesterol since your LDL was so high (194). 10 year risk of heart attack or stroke is 18.2%.

## 2020-10-04 NOTE — Progress Notes (Signed)
Patient: Crystal Patrick MRN: [id number] DOB: [birth year] PCP: Orma Flaming, MD     Subjective:  Chief Complaint  Patient presents with  . Hypertension  . f/u dizziness +orthostatic hypotension     HPI: The patient is a 75 y.o. female who presents today for HTN follow up.   I saw her 3 months ago and she had complaints of dizziness and lightheadedness with positional changes. Her orthostatic vitals were positive so I cut her norvasc back to 2.5mg /day and asked her to follow up with a log. She has not kept a log or brought one. She has taken her time to get up which helps a lot. She also has not drank  A lot of water.    She gets off balance daily. She states she is really stressed taking care of her husband. She states she is not dizzy when she gets off balance. She trips a lot because she isn't paying attention or she trips on the dog which is also a big factor.   Review of Systems  Constitutional: Positive for fatigue.  Respiratory: Negative for shortness of breath.   Cardiovascular: Negative for chest pain, palpitations and leg swelling.  Neurological: Negative for facial asymmetry, weakness, light-headedness and headaches.    Allergies Patient is allergic to other and statins.  Past Medical History Patient  has a past medical history of Anxiety and depression, Arthritis, Chicken pox, Gallstone pancreatitis (08/31/2018), GERD (gastroesophageal reflux disease), Hiatal hernia, osteoporosis (03/17/2016), Hyperlipidemia, Hypertension, Liver failure ([place]), Overactive bladder (06/02/2013), Peptic ulcer, Seasonal allergies, Squamous cell carcinoma, and Status post dilation of esophageal narrowing.  Surgical History Patient  has a past surgical history that includes Tonsillectomy and adenoidectomy; Bunionectomy (Bilateral); Nasal/sinus endoscopy; Appendectomy (1964); Cholecystectomy (N/A, 09/01/2018); and Skin cancer excision.  Family History Pateint's family history includes Bladder  Cancer in her maternal uncle; Dementia in her mother; Diabetes in her daughter; Heart disease in her father; Ovarian cancer in her paternal aunt; Prostate cancer in her brother and maternal uncle; Throat cancer in her father; Thyroid cancer in her sister.  Social History Patient  reports that she has never smoked. She has never used smokeless tobacco. She reports current alcohol use. She reports that she does not use drugs.    Objective: Vitals:   10/04/20 0932 10/04/20 0946  BP: 134/84 128/70  Pulse: 66   Temp: 98.4 F (36.9 C)   TempSrc: Temporal   SpO2: 97%   Weight: 158 lb 6.4 oz (71.8 kg)   Height: 5\' 3"  (1.6 m)     Body mass index is 28.06 kg/m.  Physical Exam Vitals reviewed.  Constitutional:      Appearance: Normal appearance. She is well-developed.  HENT:     Head: Normocephalic and atraumatic.  Eyes:     Conjunctiva/sclera: Conjunctivae normal.     Pupils: Pupils are equal, round, and reactive to light.  Neck:     Thyroid: No thyromegaly.  Cardiovascular:     Rate and Rhythm: Normal rate and regular rhythm.     Heart sounds: Murmur heard.    Pulmonary:     Effort: Pulmonary effort is normal.     Breath sounds: Normal breath sounds.  Abdominal:     General: Bowel sounds are normal. There is no distension.     Palpations: Abdomen is soft.     Tenderness: There is no abdominal tenderness.  Musculoskeletal:     Cervical back: Normal range of motion and neck supple.  Lymphadenopathy:  Cervical: No cervical adenopathy.  Skin:    General: Skin is warm and dry.     Findings: No rash.  Neurological:     General: No focal deficit present.     Mental Status: She is alert and oriented to person, place, and time.     Cranial Nerves: No cranial nerve deficit.     Coordination: Coordination normal.     Deep Tendon Reflexes: Reflexes normal.     Comments: Finger to nose normal. adiadochokineses normal bilaterally. Gait normal.   Psychiatric:        Behavior:  Behavior normal.        Assessment/plan: 1. Essential hypertension Readings are quit good. Still having some dizziness, but it's improved. We are going to stop the norvasc completely and have her continue with her other medication. I stressed importance of keeping a log so we can make sure she is to goal with cessation of the norvasc. She has also lost weight and encouraged her to continue to do this, but we would need to watch her pressures. Keep up the water intake and taking her time getting up. +murmur, but echo checked for this in 7/21. She is moving to [place] and will likely establish care there, if moving falls through I have had her make appointment for routine f/u with labs here.   2. Orthostatic hypotension See above.       Return in about 3 months (around 01/04/2021) for 6 month f/u for routine labs: htn/anxiety with alyssa .    Orma Flaming, MD [place]   10/04/2020

## 2020-11-02 ENCOUNTER — Ambulatory Visit: Payer: Medicare Other | Admitting: Family Medicine

## 2020-12-27 DIAGNOSIS — I10 Essential (primary) hypertension: Secondary | ICD-10-CM | POA: Diagnosis not present

## 2020-12-27 DIAGNOSIS — Z7689 Persons encountering health services in other specified circumstances: Secondary | ICD-10-CM | POA: Diagnosis not present

## 2020-12-27 DIAGNOSIS — E538 Deficiency of other specified B group vitamins: Secondary | ICD-10-CM | POA: Diagnosis not present

## 2020-12-27 DIAGNOSIS — F32A Depression, unspecified: Secondary | ICD-10-CM | POA: Diagnosis not present

## 2020-12-27 DIAGNOSIS — E559 Vitamin D deficiency, unspecified: Secondary | ICD-10-CM | POA: Diagnosis not present

## 2020-12-27 DIAGNOSIS — M25512 Pain in left shoulder: Secondary | ICD-10-CM | POA: Diagnosis not present

## 2021-01-03 ENCOUNTER — Other Ambulatory Visit: Payer: Self-pay | Admitting: Family Medicine

## 2021-01-07 DIAGNOSIS — M75102 Unspecified rotator cuff tear or rupture of left shoulder, not specified as traumatic: Secondary | ICD-10-CM | POA: Diagnosis not present

## 2021-01-07 DIAGNOSIS — M19012 Primary osteoarthritis, left shoulder: Secondary | ICD-10-CM | POA: Diagnosis not present

## 2021-01-07 DIAGNOSIS — M25512 Pain in left shoulder: Secondary | ICD-10-CM | POA: Diagnosis not present

## 2021-01-14 ENCOUNTER — Encounter: Payer: Medicare Other | Admitting: Physician Assistant

## 2021-03-25 ENCOUNTER — Ambulatory Visit: Payer: Medicare Other

## 2021-08-15 ENCOUNTER — Other Ambulatory Visit: Payer: Self-pay | Admitting: Family Medicine

## 2021-09-14 ENCOUNTER — Other Ambulatory Visit: Payer: Self-pay | Admitting: Family Medicine

## 2022-08-08 ENCOUNTER — Telehealth: Payer: Self-pay | Admitting: General Practice

## 2022-08-08 NOTE — Telephone Encounter (Signed)
Contacted Crystal Patrick to schedule their annual wellness visit.   NO PCP LISTED.  [place] Direct Dial [phone]
